# Patient Record
Sex: Female | Born: 1950
Health system: Southern US, Community
[De-identification: ages and names within clinical notes are randomized; demographics above are authoritative.]

## PROBLEM LIST (undated history)

## (undated) DIAGNOSIS — R011 Cardiac murmur, unspecified: Secondary | ICD-10-CM

## (undated) DIAGNOSIS — K635 Polyp of colon: Secondary | ICD-10-CM

## (undated) DIAGNOSIS — K5790 Diverticulosis of intestine, part unspecified, without perforation or abscess without bleeding: Secondary | ICD-10-CM

## (undated) DIAGNOSIS — E119 Type 2 diabetes mellitus without complications: Secondary | ICD-10-CM

## (undated) DIAGNOSIS — J45909 Unspecified asthma, uncomplicated: Secondary | ICD-10-CM

## (undated) DIAGNOSIS — H269 Unspecified cataract: Secondary | ICD-10-CM

## (undated) DIAGNOSIS — K219 Gastro-esophageal reflux disease without esophagitis: Secondary | ICD-10-CM

## (undated) DIAGNOSIS — T7840XA Allergy, unspecified, initial encounter: Secondary | ICD-10-CM

## (undated) DIAGNOSIS — I10 Essential (primary) hypertension: Secondary | ICD-10-CM

## (undated) DIAGNOSIS — J40 Bronchitis, not specified as acute or chronic: Secondary | ICD-10-CM

## (undated) HISTORY — PX: ESOPHAGOGASTRODUODENOSCOPY: SHX1529

## (undated) HISTORY — DX: Polyp of colon: K63.5

## (undated) HISTORY — DX: Gastro-esophageal reflux disease without esophagitis: K21.9

## (undated) HISTORY — PX: COLONOSCOPY: SHX174

## (undated) HISTORY — DX: Unspecified cataract: H26.9

## (undated) HISTORY — PX: TONSILLECTOMY: SUR1361

## (undated) HISTORY — DX: Allergy, unspecified, initial encounter: T78.40XA

## (undated) HISTORY — DX: Type 2 diabetes mellitus without complications: E11.9

## (undated) HISTORY — DX: Diverticulosis of intestine, part unspecified, without perforation or abscess without bleeding: K57.90

## (undated) HISTORY — DX: Unspecified asthma, uncomplicated: J45.909

## (undated) HISTORY — DX: Cardiac murmur, unspecified: R01.1

---

## 1999-02-09 ENCOUNTER — Encounter: Payer: Self-pay | Admitting: Emergency Medicine

## 1999-02-09 ENCOUNTER — Emergency Department (HOSPITAL_COMMUNITY): Admission: EM | Admit: 1999-02-09 | Discharge: 1999-02-09 | Payer: Self-pay

## 2002-08-12 ENCOUNTER — Encounter: Payer: Self-pay | Admitting: Family Medicine

## 2002-08-12 ENCOUNTER — Ambulatory Visit (HOSPITAL_COMMUNITY): Admission: RE | Admit: 2002-08-12 | Discharge: 2002-08-12 | Payer: Self-pay | Admitting: Family Medicine

## 2003-04-20 ENCOUNTER — Ambulatory Visit (HOSPITAL_COMMUNITY): Admission: RE | Admit: 2003-04-20 | Discharge: 2003-04-20 | Payer: Self-pay | Admitting: Family Medicine

## 2006-11-25 ENCOUNTER — Emergency Department (HOSPITAL_COMMUNITY): Admission: EM | Admit: 2006-11-25 | Discharge: 2006-11-25 | Payer: Self-pay | Admitting: Family Medicine

## 2008-09-27 ENCOUNTER — Emergency Department (HOSPITAL_COMMUNITY): Admission: EM | Admit: 2008-09-27 | Discharge: 2008-09-27 | Payer: Self-pay | Admitting: Family Medicine

## 2009-05-14 ENCOUNTER — Encounter (INDEPENDENT_AMBULATORY_CARE_PROVIDER_SITE_OTHER): Payer: Self-pay | Admitting: *Deleted

## 2009-07-28 ENCOUNTER — Emergency Department (HOSPITAL_COMMUNITY): Admission: EM | Admit: 2009-07-28 | Discharge: 2009-07-28 | Payer: Self-pay | Admitting: Family Medicine

## 2010-02-20 ENCOUNTER — Encounter: Payer: Self-pay | Admitting: Family Medicine

## 2010-03-02 NOTE — Letter (Signed)
Summary: New Patient letter  [facility]  [address] [place], [place] [address]   Phone: [phone]  Fax: [phone]       05/14/2009 MRN: [id number]  Erin Benitez [address] [place], [place]  [address]  Dear Erin Benitez,  Welcome to the Gastroenterology Division at [facility].    You are scheduled to see Dr.  Christella Hartigan  on 06-22-09 at 8:30am on the 3rd floor at [facility], [address]  We ask that you try to arrive at our office 15 minutes prior to your appointment time to allow for check-in.  We would like you to complete the enclosed self-administered evaluation form prior to your visit and bring it with you on the day of your appointment.  We will review it with you.  Also, please bring a complete list of all your medications or, if you prefer, bring the medication bottles and we will list them.  Please bring your insurance card so that we may make a copy of it.  If your insurance requires a referral to see a specialist, please bring your referral form from your primary care physician.  Co-payments are due at the time of your visit and may be paid by cash, check or credit card.     Your office visit will consist of a consult with your physician (includes a physical exam), any laboratory testing he/she may order, scheduling of any necessary diagnostic testing (e.g. x-ray, ultrasound, CT-scan), and scheduling of a procedure (e.g. Endoscopy, Colonoscopy) if required.  Please allow enough time on your schedule to allow for any/all of these possibilities.    If you cannot keep your appointment, please call [phone] to cancel or reschedule prior to your appointment date.  This allows [place] the opportunity to schedule an appointment for another patient in need of care.  If you do not cancel or reschedule by 5 p.m. the business day prior to your appointment date, you will be charged a $50.00 late cancellation/no-show fee.    Thank you for choosing [facility] for your medical needs.  We appreciate the opportunity to care for you.  Please visit [place] at our website  to learn more about our practice.                     Sincerely,                                                             The Gastroenterology Division

## 2010-04-19 ENCOUNTER — Other Ambulatory Visit: Payer: Self-pay | Admitting: Emergency Medicine

## 2010-04-26 ENCOUNTER — Other Ambulatory Visit: Payer: Self-pay

## 2010-05-04 ENCOUNTER — Ambulatory Visit (HOSPITAL_COMMUNITY)
Admission: RE | Admit: 2010-05-04 | Discharge: 2010-05-04 | Disposition: A | Discharge status: Home / Self Care | Payer: 59 | Source: Ambulatory Visit | Attending: Emergency Medicine | Admitting: Emergency Medicine

## 2010-05-04 DIAGNOSIS — K219 Gastro-esophageal reflux disease without esophagitis: Secondary | ICD-10-CM | POA: Insufficient documentation

## 2010-05-04 DIAGNOSIS — R079 Chest pain, unspecified: Secondary | ICD-10-CM | POA: Insufficient documentation

## 2010-05-08 LAB — URINALYSIS, MICROSCOPIC ONLY
Bilirubin Urine: NEGATIVE
Glucose, UA: NEGATIVE mg/dL
Ketones, ur: NEGATIVE mg/dL
Protein, ur: NEGATIVE mg/dL
pH: 6.5 (ref 5.0–8.0)

## 2010-05-08 LAB — COMPREHENSIVE METABOLIC PANEL
ALT: 14 U/L (ref 0–35)
AST: 19 U/L (ref 0–37)
Alkaline Phosphatase: 68 U/L (ref 39–117)
CO2: 27 mEq/L (ref 19–32)
Calcium: 9.1 mg/dL (ref 8.4–10.5)
GFR calc Af Amer: >60 mL/min (ref >60)
GFR calc non Af Amer: >60 mL/min (ref >60)
Glucose, Bld: 86 mg/dL (ref 70–99)
Potassium: 3.6 mEq/L (ref 3.5–5.1)
Sodium: 138 mEq/L (ref 135–145)
Total Protein: 7.6 g/dL (ref 6.0–8.3)

## 2010-05-08 LAB — DIFFERENTIAL
Basophils Relative: 1 % (ref 0–1)
Lymphocytes Relative: 39 % (ref 12–46)
Monocytes Absolute: 0.3 10*3/uL (ref 0.1–1.0)
Monocytes Relative: 11 % (ref 3–12)
Neutro Abs: 1.5 10*3/uL — ABNORMAL LOW (ref 1.7–7.7)
Neutrophils Relative %: 49 % (ref 43–77)

## 2010-05-08 LAB — POCT URINALYSIS DIP (DEVICE)
Glucose, UA: NEGATIVE mg/dL
Nitrite: NEGATIVE
Urobilinogen, UA: 1 mg/dL (ref 0.0–1.0)

## 2010-05-08 LAB — CBC
Hemoglobin: 12.8 g/dL (ref 12.0–15.0)
MCHC: 33.8 g/dL (ref 30.0–36.0)
RBC: 4.26 MIL/uL (ref 3.87–5.11)
WBC: 3.1 10*3/uL — ABNORMAL LOW (ref 4.0–10.5)

## 2011-06-02 ENCOUNTER — Ambulatory Visit: Payer: 59

## 2011-06-02 ENCOUNTER — Ambulatory Visit (INDEPENDENT_AMBULATORY_CARE_PROVIDER_SITE_OTHER): Payer: 59 | Admitting: Family Medicine

## 2011-06-02 VITALS — BP 182/94 | HR 79 | Temp 98.9°F | Resp 16 | Ht 63.0 in | Wt 143.0 lb

## 2011-06-02 DIAGNOSIS — K219 Gastro-esophageal reflux disease without esophagitis: Secondary | ICD-10-CM

## 2011-06-02 DIAGNOSIS — I959 Hypotension, unspecified: Secondary | ICD-10-CM

## 2011-06-02 DIAGNOSIS — I10 Essential (primary) hypertension: Secondary | ICD-10-CM

## 2011-06-02 DIAGNOSIS — M545 Low back pain: Secondary | ICD-10-CM

## 2011-06-02 MED ORDER — AMLODIPINE BESYLATE 5 MG PO TABS: 5.0000 mg | ORAL_TABLET | Freq: Every day | ORAL | Status: DC

## 2011-06-02 MED ORDER — CYCLOBENZAPRINE HCL 10 MG PO TABS: 10.0000 mg | ORAL_TABLET | Freq: Every evening | ORAL | Status: AC | PRN

## 2011-06-02 MED ORDER — ESOMEPRAZOLE MAGNESIUM 40 MG PO CPDR: 40.0000 mg | DELAYED_RELEASE_CAPSULE | Freq: Every day | ORAL | Status: DC

## 2011-06-02 MED ORDER — TRAMADOL HCL 50 MG PO TABS: 50.0000 mg | ORAL_TABLET | Freq: Three times a day (TID) | ORAL | Status: AC | PRN

## 2011-06-02 NOTE — Progress Notes (Signed)
  Subjective:    Patient ID: Erin Benitez, female    DOB: 1950-08-09, 61 y.o.   MRN: [id number]  Back Pain This is a recurrent problem. The current episode started 1 to 4 weeks ago. The problem has been gradually worsening since onset. The pain is present in the lumbar spine. The quality of the pain is described as aching. The pain does not radiate. The pain is at a severity of 7/10. The pain is moderate. The pain is worse during the day. The symptoms are aggravated by sitting. Stiffness is present in the morning.   Heat and walking helps pain.  Review of Systems  Musculoskeletal: Positive for back pain.       Objective:   Physical Exam  Constitutional: She appears well-developed.  Neck: Neck supple.  Cardiovascular: Normal rate, regular rhythm and normal heart sounds.   Pulmonary/Chest: Effort normal and breath sounds normal.  Musculoskeletal:       Lumbar back: She exhibits tenderness. She exhibits normal range of motion, no bony tenderness and no spasm.       Negative SLR  Neurological: She is alert. She has normal strength. No sensory deficit.  Reflex Scores:      Patellar reflexes are 2+ on the right side and 2+ on the left side.      Achilles reflexes are 1+ on the right side and 1+ on the left side.     [facility] reading (PRIMARY) by  Dr. Hal Hope Minimal degenerative changes L5-S1      Assessment & Plan:   1. LBP (low back pain)  DG Lumbar Spine 2-3 Views, cyclobenzaprine (FLEXERIL) 10 MG tablet, traMADol (ULTRAM) 50 MG tablet  2. HTN (hypertension)  amLODipine (NORVASC) 5 MG tablet  3. GERD (gastroesophageal reflux disease)  esomeprazole (NEXIUM) 40 MG capsule

## 2011-06-08 ENCOUNTER — Telehealth: Payer: Self-pay

## 2011-06-08 NOTE — Telephone Encounter (Signed)
LMOM to CB. Which med is making her sick - what are her Sxs? Also check status of back pain.

## 2011-06-08 NOTE — Telephone Encounter (Signed)
SAW DR Hal Hope FOR BACK PAIN.  SHE SAID THE MEDICINE SHE WAS GIVEN IS MAKING HER SICK.  CAN WE PLEASE CALL IN SOMETHING ELSE FOR HER?

## 2011-06-08 NOTE — Telephone Encounter (Signed)
Pt called back and reported that it is the tramadol that made her sick. She tried it w/food and it still caused her to be very sick w/ N/V and dizziness. She said that it helped pain a little but she still had pain and she was just so sick she couldn't take it. I asked her if she has ever had to take pain med in past that did not make her sick. She stated the only thing she thinks she has ever taken was Oxycodone after tooth surgery for a couple of days and it didn't make her sick and worked for that pain. She doesn't think she has ever had to take anything else.

## 2011-06-09 ENCOUNTER — Telehealth: Payer: Self-pay | Admitting: Family Medicine

## 2011-06-09 NOTE — Telephone Encounter (Signed)
LM on patient's VM asking for follow up on patient's back symptoms. We can call in Tylenol with codeine or Vicodin for pain however Oxycontin would not be appropriate at this time. If symptoms are worsening please ask patient to RTC.

## 2011-06-10 NOTE — Telephone Encounter (Signed)
Pt Erin Benitez and reported that her pain is not worsening, it is just about the same. She would be thankful if we would call in whichever of the suggested pain meds that might help her the most and/or be least likely to make her sick. Please call pt after med is sent in.

## 2011-06-11 NOTE — Telephone Encounter (Signed)
LMOM to CB, I need pharmacy of choice to call in RX to pt

## 2011-06-11 NOTE — Telephone Encounter (Signed)
Pt called back and wanted me to call in RX to [facility]. Called in RX and left on the VM due to them being closed

## 2011-06-11 NOTE — Telephone Encounter (Signed)
Please call in Tylenol #3 1 po every 8 hours as needed #30. Thanks

## 2011-07-09 NOTE — Telephone Encounter (Signed)
OK Tylenol #3 every 8 hours as needed #30.  If pain persists RTC.  See nursing documentation

## 2012-12-26 ENCOUNTER — Encounter (HOSPITAL_COMMUNITY): Payer: Self-pay | Admitting: Emergency Medicine

## 2012-12-26 ENCOUNTER — Emergency Department (INDEPENDENT_AMBULATORY_CARE_PROVIDER_SITE_OTHER): Payer: Self-pay

## 2012-12-26 ENCOUNTER — Emergency Department (INDEPENDENT_AMBULATORY_CARE_PROVIDER_SITE_OTHER)
Admission: EM | Admit: 2012-12-26 | Discharge: 2012-12-26 | Disposition: A | Discharge status: Home / Self Care | Payer: Self-pay | Source: Home / Self Care | Attending: Emergency Medicine | Admitting: Emergency Medicine

## 2012-12-26 DIAGNOSIS — R05 Cough: Secondary | ICD-10-CM

## 2012-12-26 DIAGNOSIS — I10 Essential (primary) hypertension: Secondary | ICD-10-CM

## 2012-12-26 HISTORY — DX: Essential (primary) hypertension: I10

## 2012-12-26 HISTORY — DX: Bronchitis, not specified as acute or chronic: J40

## 2012-12-26 MED ORDER — AMLODIPINE BESYLATE 5 MG PO TABS: 5.0000 mg | ORAL_TABLET | Freq: Every day | ORAL | Status: DC

## 2012-12-26 MED ORDER — FLUTICASONE PROPIONATE 50 MCG/ACT NA SUSP: 2.0000 | Freq: Every day | NASAL | Status: DC

## 2012-12-26 MED ORDER — HYDROCOD POLST-CHLORPHEN POLST 10-8 MG/5ML PO LQCR: 5.0000 mL | Freq: Two times a day (BID) | ORAL | Status: DC | PRN

## 2012-12-26 MED ORDER — ALBUTEROL SULFATE HFA 108 (90 BASE) MCG/ACT IN AERS: 1.0000 | INHALATION_SPRAY | Freq: Four times a day (QID) | RESPIRATORY_TRACT | Status: DC | PRN

## 2012-12-26 MED ORDER — AMOXICILLIN 500 MG PO CAPS: 1000.0000 mg | ORAL_CAPSULE | Freq: Three times a day (TID) | ORAL | Status: DC

## 2012-12-26 MED ORDER — ESOMEPRAZOLE MAGNESIUM 40 MG PO CPDR: 40.0000 mg | DELAYED_RELEASE_CAPSULE | Freq: Every day | ORAL | Status: DC

## 2012-12-26 NOTE — ED Notes (Signed)
Concern for ' lung cancer '; Recent visit to sister who has recently been diagnosed as having brain cancer. Pt has cough for 3 weeks, and she had read in Internet that a "persistant cough" can be a sympt om of lung cancer. C/o she has tried multiple OTC meds w/o relief

## 2012-12-26 NOTE — ED Provider Notes (Signed)
Chief Complaint:   Chief Complaint  Patient presents with  . Cough    History of Present Illness:   Erin Benitez is a 62 year old female who has had a three-week history of a cough productive of small amounts of clear sputum. She's felt short of breath and had some tightness in her chest when she coughs. She also notes sternal pain when she coughs. She's had sweats and felt chilled but no fever. She notes clear rhinorrhea, postnasal drainage, and her eyes itch and water. Her throat has felt scratchy. She has a history of reflux and is on Nexium. This has flared up about a week ago and she's only been taking the medication for about a week. She denies any wheezing or asthma history. She's never been a cigarette smoker. She denies any history of allergies. She is concerned about the possibility of lung cancer since several other family members have had cancer.  Review of Systems:  Other than noted above, the patient denies any of the following symptoms: Systemic:  No fevers, chills, sweats, weight loss or gain, or fatigue. ENT:  No nasal congestion, sneezing, itching, postnasal drip, sinus pressure, headache, sore throat, or hoarseness. Lungs:  No wheezing, shortness of breath, chest tightness or congestion. Heart:  No chest pain, tightness, pressure, PND, orthopnea, or ankle edema. GI:  No indigestion, heartburn, waterbrash, burping, abdominal pain, nausea, or vomiting.  PMFSH:  Past medical history, family history, social history, meds, and allergies were reviewed.  Specifically, there is no history of asthma, allergies, reflux esophagitis or cigarette smoking. She has a history of a heart murmur but never been treated for this. She had had high blood pressure and was on amlodipine, but she stopped this on her own.  Physical Exam:   Vital signs:  There were no vitals taken for this visit. General:  Alert and oriented.  In no distress.  Skin warm and dry. ENT: TMs and ear canals normal.  Nasal  mucosa normal, without drainage.  Pharynx clear without exudate or drainage.  No intraoral lesions. Neck:  No adenopathy, tenderness or mass.  No JVD. Lungs:  No respiratory distress.  Breath sounds clear and equal bilaterally.  No wheezes, rales or rhonchi. Heart:  Regular rhythm, no gallops or murmers.  No pedal edema. Abdomon:  Soft and nontender.  No organomegaly or mass.  Radiology:  Dg Chest 2 View  12/26/2012   CLINICAL DATA:  Nonproductive cough for 3 weeks  EXAM: CHEST  2 VIEW  COMPARISON:  07/28/2009  FINDINGS: Cardiac shadow is within normal limits. The aortic knob shows mild calcifications. The lungs are clear bilaterally. No acute bony abnormality is seen.  IMPRESSION: No acute abnormality noted.   Electronically Signed   By: Alcide Clever M.D.   On: 12/26/2012 09:16   Assessment:  The primary encounter diagnosis was Cough. A diagnosis of Hypertension was also pertinent to this visit.  Persisting cough, probably multifactorial. This could be a persistent viral cough, reactive airways disease, due to reflux, or due to postnasal drainage from sinusitis.  Plan:   1.  Meds:  The following meds were prescribed:   New Prescriptions   ALBUTEROL (PROVENTIL HFA;VENTOLIN HFA) 108 (90 BASE) MCG/ACT INHALER    Inhale 1-2 puffs into the lungs every 6 (six) hours as needed for wheezing or shortness of breath.   AMLODIPINE (NORVASC) 5 MG TABLET    Take 1 tablet (5 mg total) by mouth daily.   AMOXICILLIN (AMOXIL) 500 MG CAPSULE  Take 2 capsules (1,000 mg total) by mouth 3 (three) times daily.   CHLORPHENIRAMINE-HYDROCODONE (TUSSIONEX) 10-8 MG/5ML LQCR    Take 5 mLs by mouth every 12 (twelve) hours as needed for cough.   ESOMEPRAZOLE (NEXIUM) 40 MG CAPSULE    Take 1 capsule (40 mg total) by mouth daily.   FLUTICASONE (FLONASE) 50 MCG/ACT NASAL SPRAY    Place 2 sprays into both nostrils daily.    2.  Patient Education/Counseling:  The patient was given appropriate handouts, self care  instructions, and instructed in symptomatic relief.  Suggested she restart her amlodipine since her blood pressure was elevated today in followup with her primary care physician in 2 weeks. She should stay on her Nexium and follow a reflux diet.  3.  Follow up:  The patient was told to follow up if no better in 10 days, or sooner if becoming worse in any way, and given some red flag symptoms such as fever or difficulty breathing which would prompt immediate return.  Follow up here or with her primary care physician, and in 2 weeks with Dr. Nadyne Coombes for her blood pressure.       Reuben Likes, MD 12/26/12 [phone]

## 2013-01-11 ENCOUNTER — Ambulatory Visit: Payer: Self-pay | Attending: Internal Medicine

## 2013-02-11 ENCOUNTER — Ambulatory Visit: Payer: Self-pay

## 2013-04-01 ENCOUNTER — Ambulatory Visit (INDEPENDENT_AMBULATORY_CARE_PROVIDER_SITE_OTHER): Payer: BC Managed Care – PPO | Admitting: Internal Medicine

## 2013-04-01 VITALS — BP 182/100 | HR 77 | Temp 98.0°F | Resp 18 | Ht 63.0 in | Wt 148.2 lb

## 2013-04-01 DIAGNOSIS — J45909 Unspecified asthma, uncomplicated: Secondary | ICD-10-CM | POA: Insufficient documentation

## 2013-04-01 DIAGNOSIS — Z889 Allergy status to unspecified drugs, medicaments and biological substances status: Secondary | ICD-10-CM

## 2013-04-01 DIAGNOSIS — Z0000 Encounter for general adult medical examination without abnormal findings: Secondary | ICD-10-CM

## 2013-04-01 DIAGNOSIS — I10 Essential (primary) hypertension: Secondary | ICD-10-CM | POA: Insufficient documentation

## 2013-04-01 DIAGNOSIS — K219 Gastro-esophageal reflux disease without esophagitis: Secondary | ICD-10-CM

## 2013-04-01 DIAGNOSIS — Z111 Encounter for screening for respiratory tuberculosis: Secondary | ICD-10-CM

## 2013-04-01 DIAGNOSIS — E1159 Type 2 diabetes mellitus with other circulatory complications: Secondary | ICD-10-CM | POA: Insufficient documentation

## 2013-04-01 DIAGNOSIS — Z9109 Other allergy status, other than to drugs and biological substances: Secondary | ICD-10-CM

## 2013-04-01 LAB — POCT CBC
GRANULOCYTE PERCENT: 53.9 % (ref 37–80)
HEMATOCRIT: 38.9 % (ref 37.7–47.9)
HEMOGLOBIN: 11.7 g/dL — AB (ref 12.2–16.2)
Lymph, poc: 1.3 (ref 0.6–3.4)
MCH, POC: 27.7 pg (ref 27–31.2)
MCHC: 30.1 g/dL — AB (ref 31.8–35.4)
MCV: 92.2 fL (ref 80–97)
MID (cbc): 0.4 (ref 0–0.9)
MPV: 8.9 fL (ref 0–99.8)
POC GRANULOCYTE: 2 (ref 2–6.9)
POC LYMPH PERCENT: 35.4 %L (ref 10–50)
POC MID %: 10.7 %M (ref 0–12)
Platelet Count, POC: 238 10*3/uL (ref 142–424)
RBC: 4.22 M/uL (ref 4.04–5.48)
RDW, POC: 14.5 %
WBC: 3.7 10*3/uL — AB (ref 4.6–10.2)

## 2013-04-01 MED ORDER — ALBUTEROL SULFATE HFA 108 (90 BASE) MCG/ACT IN AERS: 1.0000 | INHALATION_SPRAY | Freq: Four times a day (QID) | RESPIRATORY_TRACT | Status: DC | PRN

## 2013-04-01 MED ORDER — AMLODIPINE BESYLATE 10 MG PO TABS: 10.0000 mg | ORAL_TABLET | Freq: Every day | ORAL | Status: DC

## 2013-04-01 MED ORDER — FLUTICASONE PROPIONATE 50 MCG/ACT NA SUSP: 2.0000 | Freq: Every day | NASAL | Status: DC

## 2013-04-01 MED ORDER — ESOMEPRAZOLE MAGNESIUM 40 MG PO CPDR: 40.0000 mg | DELAYED_RELEASE_CAPSULE | Freq: Every day | ORAL | Status: DC

## 2013-04-01 NOTE — Progress Notes (Signed)
   Subjective:    Patient ID: Erin Benitez, female    DOB: [birth year], 63 y.o.   MRN: [id number]  HPI    Review of Systems     Objective:   Physical Exam        Assessment & Plan:

## 2013-04-01 NOTE — Patient Instructions (Addendum)
Colonoscopy A colonoscopy is an exam to look at the entire large intestine (colon). This exam can help find problems such as tumors, polyps, inflammation, and areas of bleeding. The exam takes about 1 hour.  LET [facility] CARE PROVIDER KNOW ABOUT:   Any allergies you have.  All medicines you are taking, including vitamins, herbs, eye drops, creams, and over-the-counter medicines.  Previous problems you or members of your family have had with the use of anesthetics.  Any blood disorders you have.  Previous surgeries you have had.  Medical conditions you have. RISKS AND COMPLICATIONS  Generally, this is a safe procedure. However, as with any procedure, complications can occur. Possible complications include:  Bleeding.  Tearing or rupture of the colon wall.  Reaction to medicines given during the exam.  Infection (rare). BEFORE THE PROCEDURE   Ask your health care provider about changing or stopping your regular medicines.  You may be prescribed an oral bowel prep. This involves drinking a large amount of medicated liquid, starting the day before your procedure. The liquid will cause you to have multiple loose stools until your stool is almost clear or light green. This cleans out your colon in preparation for the procedure.  Do not eat or drink anything else once you have started the bowel prep, unless your health care provider tells you it is safe to do so.  Arrange for someone to drive you home after the procedure. PROCEDURE   You will be given medicine to help you relax (sedative).  You will lie on your side with your knees bent.  A long, flexible tube with a light and camera on the end (colonoscope) will be inserted through the rectum and into the colon. The camera sends video back to a computer screen as it moves through the colon. The colonoscope also releases carbon dioxide gas to inflate the colon. This helps your health care provider see the area better.  During  the exam, your health care provider may take a small tissue sample (biopsy) to be examined under a microscope if any abnormalities are found.  The exam is finished when the entire colon has been viewed. AFTER THE PROCEDURE   Do not drive for 24 hours after the exam.  You may have a small amount of blood in your stool.  You may pass moderate amounts of gas and have mild abdominal cramping or bloating. This is caused by the gas used to inflate your colon during the exam.  Ask when your test results will be ready and how you will get your results. Make sure you get your test results. Document Released: 01/15/2000 Document Revised: 11/07/2012 Document Reviewed: 09/24/2012 [facility] Patient Information 2014 [place]. DASH Diet The DASH diet stands for "Dietary Approaches to Stop Hypertension." It is a healthy eating plan that has been shown to reduce high blood pressure (hypertension) in as little as 14 days, while also possibly providing other significant health benefits. These other health benefits include reducing the risk of breast cancer after menopause and reducing the risk of type 2 diabetes, heart disease, colon cancer, and stroke. Health benefits also include weight loss and slowing kidney failure in patients with chronic kidney disease.  DIET GUIDELINES  Limit salt (sodium). Your diet should contain less than 1500 mg of sodium daily.  Limit refined or processed carbohydrates. Your diet should include mostly whole grains. Desserts and added sugars should be used sparingly.  Include small amounts of heart-healthy fats. These types of fats include  nuts, oils, and tub margarine. Limit saturated and trans fats. These fats have been shown to be harmful in the body. CHOOSING FOODS  The following food groups are based on a 2000 calorie diet. See your Registered Dietitian for individual calorie needs. Grains and Grain Products (6 to 8 servings daily)  Eat More Often: Whole-wheat bread,  brown rice, whole-grain or wheat pasta, quinoa, popcorn without added fat or salt (air popped).  Eat Less Often: White bread, white pasta, white rice, cornbread. Vegetables (4 to 5 servings daily)  Eat More Often: Fresh, frozen, and canned vegetables. Vegetables may be raw, steamed, roasted, or grilled with a minimal amount of fat.  Eat Less Often/Avoid: Creamed or fried vegetables. Vegetables in a cheese sauce. Fruit (4 to 5 servings daily)  Eat More Often: All fresh, canned (in natural juice), or frozen fruits. Dried fruits without added sugar. One hundred percent fruit juice ( cup [237 mL] daily).  Eat Less Often: Dried fruits with added sugar. Canned fruit in light or heavy syrup. [employer], Fish, and Poultry (2 servings or less daily. One serving is 3 to 4 oz [85-114 g]).  Eat More Often: Ninety percent or leaner ground beef, tenderloin, sirloin. Round cuts of beef, chicken breast, [place] breast. All fish. Grill, bake, or broil your meat. Nothing should be fried.  Eat Less Often/Avoid: Fatty cuts of meat, [place], or chicken leg, thigh, or wing. Fried cuts of meat or fish. Dairy (2 to 3 servings)  Eat More Often: Low-fat or fat-free milk, low-fat plain or light yogurt, reduced-fat or part-skim cheese.  Eat Less Often/Avoid: Milk (whole, 2%).Whole milk yogurt. Full-fat cheeses. Nuts, Seeds, and Legumes (4 to 5 servings per week)  Eat More Often: All without added salt.  Eat Less Often/Avoid: Salted nuts and seeds, canned beans with added salt. Fats and Sweets (limited)  Eat More Often: Vegetable oils, tub margarines without trans fats, sugar-free gelatin. Mayonnaise and salad dressings.  Eat Less Often/Avoid: Coconut oils, palm oils, butter, stick margarine, cream, half and half, cookies, candy, pie. FOR MORE INFORMATION The Dash Diet Eating Plan: www.dashdiet.org Document Released: 01/06/2011 Document Revised: 04/11/2011 Document Reviewed: 01/06/2011 [facility] Patient  Information 2014 [place], [place]. Hypertension As your heart beats, it forces blood through your arteries. This force is your blood pressure. If the pressure is too high, it is called hypertension (HTN) or high blood pressure. HTN is dangerous because you may have it and not know it. High blood pressure may mean that your heart has to work harder to pump blood. Your arteries may be narrow or stiff. The extra work puts you at risk for heart disease, stroke, and other problems.  Blood pressure consists of two numbers, a higher number over a lower, 110/72, for example. It is stated as "110 over 72." The ideal is below 120 for the top number (systolic) and under 80 for the bottom (diastolic). Write down your blood pressure today. You should pay close attention to your blood pressure if you have certain conditions such as:  Heart failure.  Prior heart attack.  Diabetes  Chronic kidney disease.  Prior stroke.  Multiple risk factors for heart disease. To see if you have HTN, your blood pressure should be measured while you are seated with your arm held at the level of the heart. It should be measured at least twice. A one-time elevated blood pressure reading (especially in the Emergency Department) does not mean that you need treatment. There may be conditions in which the  blood pressure is different between your right and left arms. It is important to see your caregiver soon for a recheck. Most people have essential hypertension which means that there is not a specific cause. This type of high blood pressure may be lowered by changing lifestyle factors such as:  Stress.  Smoking.  Lack of exercise.  Excessive weight.  Drug/tobacco/alcohol use.  Eating less salt. Most people do not have symptoms from high blood pressure until it has caused damage to the body. Effective treatment can often prevent, delay or reduce that damage. TREATMENT  When a cause has been identified, treatment for high  blood pressure is directed at the cause. There are a large number of medications to treat HTN. These fall into several categories, and your caregiver will help you select the medicines that are best for you. Medications may have side effects. You should review side effects with your caregiver. If your blood pressure stays high after you have made lifestyle changes or started on medicines,   Your medication(s) may need to be changed.  Other problems may need to be addressed.  Be certain you understand your prescriptions, and know how and when to take your medicine.  Be sure to follow up with your caregiver within the time frame advised (usually within two weeks) to have your blood pressure rechecked and to review your medications.  If you are taking more than one medicine to lower your blood pressure, make sure you know how and at what times they should be taken. Taking two medicines at the same time can result in blood pressure that is too low. SEEK IMMEDIATE MEDICAL CARE IF:  You develop a severe headache, blurred or changing vision, or confusion.  You have unusual weakness or numbness, or a faint feeling.  You have severe chest or abdominal pain, vomiting, or breathing problems. MAKE SURE YOU:   Understand these instructions.  Will watch your condition.  Will get help right away if you are not doing well or get worse. Document Released: 01/17/2005 Document Revised: 04/11/2011 Document Reviewed: 09/07/2007 [facility] Patient Information 2014 [place]. Diet for Gastroesophageal Reflux Disease, Adult Reflux (acid reflux) is when acid from your stomach flows up into the esophagus. When acid comes in contact with the esophagus, the acid causes irritation and soreness (inflammation) in the esophagus. When reflux happens often or so severely that it causes damage to the esophagus, it is called gastroesophageal reflux disease (GERD). Nutrition therapy can help ease the discomfort of  GERD. FOODS OR DRINKS TO AVOID OR LIMIT  Smoking or chewing tobacco. Nicotine is one of the most potent stimulants to acid production in the gastrointestinal tract.  Caffeinated and decaffeinated coffee and black tea.  Regular or low-calorie carbonated beverages or energy drinks (caffeine-free carbonated beverages are allowed).   Strong spices, such as black pepper, white pepper, red pepper, cayenne, curry powder, and chili powder.  Peppermint or spearmint.  Chocolate.  High-fat foods, including meats and fried foods. Extra added fats including oils, butter, salad dressings, and nuts. Limit these to less than 8 tsp per day.  Fruits and vegetables if they are not tolerated, such as citrus fruits or tomatoes.  Alcohol.  Any food that seems to aggravate your condition. If you have questions regarding your diet, call your caregiver or a registered dietitian. OTHER THINGS THAT MAY HELP GERD INCLUDE:   Eating your meals slowly, in a relaxed setting.  Eating 5 to 6 small meals per day instead of 3 large  meals.  Eliminating food for a period of time if it causes distress.  Not lying down until 3 hours after eating a meal.  Keeping the head of your bed raised 6 to 9 inches (15 to 23 cm) by using a foam wedge or blocks under the legs of the bed. Lying flat may make symptoms worse.  Being physically active. Weight loss may be helpful in reducing reflux in overweight or obese adults.  Wear loose fitting clothing EXAMPLE MEAL PLAN This meal plan is approximately 2,000 calories based on [email or web address] meal planning guidelines. Breakfast   cup cooked oatmeal.  1 cup strawberries.  1 cup low-fat milk.  1 oz almonds. Snack  1 cup cucumber slices.  6 oz yogurt (made from low-fat or fat-free milk). Lunch  2 slice whole-wheat bread.  2 oz sliced [place].  2 tsp mayonnaise.  1 cup blueberries.  1 cup snap peas. Snack  6 whole-wheat crackers.  1 oz string  cheese. Dinner   cup brown rice.  1 cup mixed veggies.  1 tsp olive oil.  3 oz grilled fish. Document Released: 01/17/2005 Document Revised: 04/11/2011 Document Reviewed: 12/03/2010 [facility] Patient Information 2014 [place], [place].

## 2013-04-01 NOTE — Progress Notes (Signed)
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   Also needs a RF of Nexium, Albuterol, Norvasc and Flonase Has been out of the Norvasc and Nexium x 2 weeks     Tuberculosis Risk Questionnaire  1. No Were you born outside the [place] in one of the following parts of the world: [place], [place], [place], [place] or [place]?   2. No Have you traveled outside the [place] and lived for more than one month in one of the following parts of the world: [place], [place], [place], [place] or [place]?    3. No Do you have a compromised immune system such as nany of the following conditions:HIV/AIDS, organ or bone marrow transplantation, diabetes, immunosuppressive medicines (e.g. Prednisone, Remicaide), leukemia, lymphoma, cancer of the head or neck, gastrectomy or jejunal bypass, end-stage renal disease (on dialysis), or silicosis?     4.No Have you ever or do you plan on working in: a residential care center, a health care facility, a jail or prison or homeless shelter?    5.No  Have you ever: injected illegal drugs, used crack cocaine, lived in a homeless shelter  noor been in jail or prison?    6.No Have you ever been exposed to anyone with infectious tuberculosis?    Tuberculosis Symptom Questionnaire  Do you currently have any of the following symptoms?  1.  No Unexplained cough lasting more than 3 weeks?   2. No Unexplained fever lasting more than 3 weeks.   3. No Night Sweats (sweating that leaves the bedclothes and sheets wet)    4. Yes due to GERD Shortness of Breath   5. No Chest Pain   6. No Unintentional weight loss   7. No Unexplained fatigue (very tired for no reason)

## 2013-04-01 NOTE — Progress Notes (Signed)
   Subjective:    Patient ID: Erin Benitez, female    DOB: 1950/06/22, 63 y.o.   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   Also needs a RF of Nexium, Albuterol, Norvasc and Flonase Has been out of the Norvasc and Nexium x 2 weeks  Has not had a mammogram in year and has never had a colonoscopy.   Tuberculosis Risk Questionnaire  1. No Were you born outside the [place] in one of the following parts of the world: [place], [place], [place], [place] or [place]?   2. No Have you traveled outside the [place] and lived for more than one month in one of the following parts of the world: [place], [place], [place], [place] or [place]?    3. No Do you have a compromised immune system such as nany of the following conditions:HIV/AIDS, organ or bone marrow transplantation, diabetes, immunosuppressive medicines (e.g. Prednisone, Remicaide), leukemia, lymphoma, cancer of the head or neck, gastrectomy or jejunal bypass, end-stage renal disease (on dialysis), or silicosis?     4.No Have you ever or do you plan on working in: a residential care center, a health care facility, a jail or prison or homeless shelter?    5.No  Have you ever: injected illegal drugs, used crack cocaine, lived in a homeless shelter  noor been in jail or prison?    6.No Have you ever been exposed to anyone with infectious tuberculosis?    Tuberculosis Symptom Questionnaire  Do you currently have any of the following symptoms?  1.  No Unexplained cough lasting more than 3 weeks?   2. No Unexplained fever lasting more than 3 weeks.   3. No Night Sweats (sweating that leaves the bedclothes and sheets wet)    4. Yes due to GERD Shortness of Breath   Review of Systems  Respiratory: Positive for cough, shortness of breath and wheezing.        Has a h/o of GERD and asthma  Musculoskeletal: Positive for myalgias.  Allergic/Immunologic: Positive for  environmental allergies.       Seasonal  Neurological: Positive for headaches.       Objective:   Physical Exam        Assessment & Plan:

## 2013-04-02 LAB — COMPLETE METABOLIC PANEL WITH GFR
ALBUMIN: 4.6 g/dL (ref 3.5–5.2)
ALT: 23 U/L (ref 0–35)
AST: 19 U/L (ref 0–37)
Alkaline Phosphatase: 91 U/L (ref 39–117)
BUN: 14 mg/dL (ref 6–23)
CO2: 27 meq/L (ref 19–32)
Calcium: 9.8 mg/dL (ref 8.4–10.5)
Chloride: 102 mEq/L (ref 96–112)
Creat: 0.68 mg/dL (ref 0.50–1.10)
GLUCOSE: 98 mg/dL (ref 70–99)
POTASSIUM: 4.3 meq/L (ref 3.5–5.3)
SODIUM: 139 meq/L (ref 135–145)
TOTAL PROTEIN: 7.9 g/dL (ref 6.0–8.3)
Total Bilirubin: 0.5 mg/dL (ref 0.2–1.2)

## 2013-04-02 LAB — LIPID PANEL
Cholesterol: 285 mg/dL — ABNORMAL HIGH (ref 0–200)
HDL: 44 mg/dL (ref >39)
LDL CALC: 198 mg/dL — AB (ref 0–99)
Total CHOL/HDL Ratio: 6.5 Ratio
Triglycerides: 216 mg/dL — ABNORMAL HIGH (ref <150)
VLDL: 43 mg/dL — AB (ref 0–40)

## 2013-04-02 LAB — TSH: TSH: 1.024 u[IU]/mL (ref 0.350–4.500)

## 2013-04-03 ENCOUNTER — Encounter: Payer: Self-pay | Admitting: *Deleted

## 2013-04-04 ENCOUNTER — Ambulatory Visit (INDEPENDENT_AMBULATORY_CARE_PROVIDER_SITE_OTHER): Payer: BC Managed Care – PPO | Admitting: Family Medicine

## 2013-04-04 DIAGNOSIS — Z111 Encounter for screening for respiratory tuberculosis: Secondary | ICD-10-CM

## 2013-04-04 LAB — TB SKIN TEST
Induration: 0 mm
TB SKIN TEST: NEGATIVE

## 2013-04-16 ENCOUNTER — Other Ambulatory Visit: Payer: Self-pay | Admitting: Internal Medicine

## 2013-05-13 ENCOUNTER — Encounter (INDEPENDENT_AMBULATORY_CARE_PROVIDER_SITE_OTHER): Payer: BC Managed Care – PPO | Admitting: Ophthalmology

## 2013-05-13 DIAGNOSIS — H35039 Hypertensive retinopathy, unspecified eye: Secondary | ICD-10-CM

## 2013-05-13 DIAGNOSIS — I10 Essential (primary) hypertension: Secondary | ICD-10-CM

## 2013-05-13 DIAGNOSIS — H2510 Age-related nuclear cataract, unspecified eye: Secondary | ICD-10-CM

## 2013-05-13 DIAGNOSIS — H3540 Unspecified peripheral retinal degeneration: Secondary | ICD-10-CM

## 2013-06-10 ENCOUNTER — Encounter: Payer: Self-pay | Admitting: Internal Medicine

## 2014-03-04 ENCOUNTER — Emergency Department (HOSPITAL_COMMUNITY): Payer: 59

## 2014-03-04 ENCOUNTER — Emergency Department (HOSPITAL_COMMUNITY)
Admission: EM | Admit: 2014-03-04 | Discharge: 2014-03-04 | Disposition: A | Discharge status: Home / Self Care | Payer: 59 | Attending: Emergency Medicine | Admitting: Emergency Medicine

## 2014-03-04 ENCOUNTER — Encounter (HOSPITAL_COMMUNITY): Payer: Self-pay

## 2014-03-04 DIAGNOSIS — J45909 Unspecified asthma, uncomplicated: Secondary | ICD-10-CM | POA: Diagnosis not present

## 2014-03-04 DIAGNOSIS — R1031 Right lower quadrant pain: Secondary | ICD-10-CM | POA: Diagnosis not present

## 2014-03-04 DIAGNOSIS — R1013 Epigastric pain: Secondary | ICD-10-CM | POA: Diagnosis present

## 2014-03-04 DIAGNOSIS — R630 Anorexia: Secondary | ICD-10-CM | POA: Diagnosis not present

## 2014-03-04 DIAGNOSIS — I10 Essential (primary) hypertension: Secondary | ICD-10-CM | POA: Diagnosis not present

## 2014-03-04 DIAGNOSIS — R1032 Left lower quadrant pain: Secondary | ICD-10-CM | POA: Diagnosis not present

## 2014-03-04 DIAGNOSIS — Z79899 Other long term (current) drug therapy: Secondary | ICD-10-CM | POA: Diagnosis not present

## 2014-03-04 DIAGNOSIS — Z7951 Long term (current) use of inhaled steroids: Secondary | ICD-10-CM | POA: Diagnosis not present

## 2014-03-04 DIAGNOSIS — R109 Unspecified abdominal pain: Secondary | ICD-10-CM

## 2014-03-04 LAB — URINALYSIS, ROUTINE W REFLEX MICROSCOPIC
BILIRUBIN URINE: NEGATIVE
GLUCOSE, UA: NEGATIVE mg/dL
HGB URINE DIPSTICK: NEGATIVE
Ketones, ur: NEGATIVE mg/dL
Leukocytes, UA: NEGATIVE
NITRITE: NEGATIVE
PH: 7 (ref 5.0–8.0)
PROTEIN: NEGATIVE mg/dL
SPECIFIC GRAVITY, URINE: 1.015 (ref 1.005–1.030)
Urobilinogen, UA: 0.2 mg/dL (ref 0.0–1.0)

## 2014-03-04 LAB — COMPREHENSIVE METABOLIC PANEL
ALK PHOS: 96 U/L (ref 39–117)
ALT: 20 U/L (ref 0–35)
ANION GAP: 8 (ref 5–15)
AST: 24 U/L (ref 0–37)
Albumin: 4.4 g/dL (ref 3.5–5.2)
BILIRUBIN TOTAL: 0.7 mg/dL (ref 0.3–1.2)
BUN: 16 mg/dL (ref 6–23)
CHLORIDE: 105 mmol/L (ref 96–112)
CO2: 26 mmol/L (ref 19–32)
Calcium: 9.1 mg/dL (ref 8.4–10.5)
Creatinine, Ser: 0.65 mg/dL (ref 0.50–1.10)
GLUCOSE: 143 mg/dL — AB (ref 70–99)
Potassium: 3.8 mmol/L (ref 3.5–5.1)
Sodium: 139 mmol/L (ref 135–145)
Total Protein: 7.9 g/dL (ref 6.0–8.3)

## 2014-03-04 LAB — CBC WITH DIFFERENTIAL/PLATELET
BASOS ABS: 0 10*3/uL (ref 0.0–0.1)
BASOS PCT: 0 % (ref 0–1)
Eosinophils Absolute: 0.1 10*3/uL (ref 0.0–0.7)
Eosinophils Relative: 2 % (ref 0–5)
HCT: 39.3 % (ref 36.0–46.0)
Hemoglobin: 13 g/dL (ref 12.0–15.0)
LYMPHS ABS: 1.6 10*3/uL (ref 0.7–4.0)
LYMPHS PCT: 43 % (ref 12–46)
MCH: 28.8 pg (ref 26.0–34.0)
MCHC: 33.1 g/dL (ref 30.0–36.0)
MCV: 86.9 fL (ref 78.0–100.0)
MONO ABS: 0.4 10*3/uL (ref 0.1–1.0)
MONOS PCT: 10 % (ref 3–12)
NEUTROS PCT: 45 % (ref 43–77)
Neutro Abs: 1.7 10*3/uL (ref 1.7–7.7)
Platelets: 242 10*3/uL (ref 150–400)
RBC: 4.52 MIL/uL (ref 3.87–5.11)
RDW: 12.7 % (ref 11.5–15.5)
WBC: 3.8 10*3/uL — AB (ref 4.0–10.5)

## 2014-03-04 MED ORDER — DICYCLOMINE HCL 20 MG PO TABS: 20.0000 mg | ORAL_TABLET | Freq: Two times a day (BID) | ORAL | Status: DC

## 2014-03-04 MED ORDER — ACETAMINOPHEN 325 MG PO TABS
650.0000 mg | ORAL_TABLET | Freq: Once | ORAL | Status: AC
  Administered 2014-03-04: 650 mg via ORAL
  Filled 2014-03-04: qty 2

## 2014-03-04 MED ORDER — MORPHINE SULFATE 4 MG/ML IJ SOLN
4.0000 mg | Freq: Once | INTRAMUSCULAR | Status: DC
  Filled 2014-03-04: qty 1

## 2014-03-04 MED ORDER — IOHEXOL 300 MG/ML  SOLN
50.0000 mL | Freq: Once | INTRAMUSCULAR | Status: AC | PRN
  Administered 2014-03-04: 50 mL via ORAL

## 2014-03-04 MED ORDER — IOHEXOL 300 MG/ML  SOLN
100.0000 mL | Freq: Once | INTRAMUSCULAR | Status: AC | PRN
  Administered 2014-03-04: 100 mL via INTRAVENOUS

## 2014-03-04 MED ORDER — HYDROCODONE-ACETAMINOPHEN 5-325 MG PO TABS: 1.0000 | ORAL_TABLET | Freq: Four times a day (QID) | ORAL | Status: DC | PRN

## 2014-03-04 MED ORDER — ONDANSETRON HCL 4 MG/2ML IJ SOLN
4.0000 mg | Freq: Once | INTRAMUSCULAR | Status: DC
  Filled 2014-03-04: qty 2

## 2014-03-04 NOTE — ED Notes (Addendum)
CT at beside.

## 2014-03-04 NOTE — Discharge Instructions (Signed)
Abdominal Pain, Women °Abdominal (stomach, pelvic, or belly) pain can be caused by many things. It is important to tell your doctor: °· The location of the pain. °· Does it come and go or is it present all the time? °· Are there things that start the pain (eating certain foods, exercise)? °· Are there other symptoms associated with the pain (fever, nausea, vomiting, diarrhea)? °All of this is helpful to know when trying to find the cause of the pain. °CAUSES  °· Stomach: virus or bacteria infection, or ulcer. °· Intestine: appendicitis (inflamed appendix), regional ileitis (Crohn's disease), ulcerative colitis (inflamed colon), irritable bowel syndrome, diverticulitis (inflamed diverticulum of the colon), or cancer of the stomach or intestine. °· Gallbladder disease or stones in the gallbladder. °· Kidney disease, kidney stones, or infection. °· Pancreas infection or cancer. °· Fibromyalgia (pain disorder). °· Diseases of the female organs: °¨ Uterus: fibroid (non-cancerous) tumors or infection. °¨ Fallopian tubes: infection or tubal pregnancy. °¨ Ovary: cysts or tumors. °¨ Pelvic adhesions (scar tissue). °¨ Endometriosis (uterus lining tissue growing in the pelvis and on the pelvic organs). °¨ Pelvic congestion syndrome (female organs filling up with blood just before the menstrual period). °¨ Pain with the menstrual period. °¨ Pain with ovulation (producing an egg). °¨ Pain with an IUD (intrauterine device, birth control) in the uterus. °¨ Cancer of the female organs. °· Functional pain (pain not caused by a disease, may improve without treatment). °· Psychological pain. °· Depression. °DIAGNOSIS  °Your doctor will decide the seriousness of your pain by doing an examination. °· Blood tests. °· X-rays. °· Ultrasound. °· CT scan (computed tomography, special type of X-ray). °· MRI (magnetic resonance imaging). °· Cultures, for infection. °· Barium enema (dye inserted in the large intestine, to better view it with  X-rays). °· Colonoscopy (looking in intestine with a lighted tube). °· Laparoscopy (minor surgery, looking in abdomen with a lighted tube). °· Major abdominal exploratory surgery (looking in abdomen with a large incision). °TREATMENT  °The treatment will depend on the cause of the pain.  °· Many cases can be observed and treated at home. °· Over-the-counter medicines recommended by your caregiver. °· Prescription medicine. °· Antibiotics, for infection. °· Birth control pills, for painful periods or for ovulation pain. °· Hormone treatment, for endometriosis. °· Nerve blocking injections. °· Physical therapy. °· Antidepressants. °· Counseling with a psychologist or psychiatrist. °· Minor or major surgery. °HOME CARE INSTRUCTIONS  °· Do not take laxatives, unless directed by your caregiver. °· Take over-the-counter pain medicine only if ordered by your caregiver. Do not take aspirin because it can cause an upset stomach or bleeding. °· Try a clear liquid diet (broth or water) as ordered by your caregiver. Slowly move to a bland diet, as tolerated, if the pain is related to the stomach or intestine. °· Have a thermometer and take your temperature several times a day, and record it. °· Bed rest and sleep, if it helps the pain. °· Avoid sexual intercourse, if it causes pain. °· Avoid stressful situations. °· Keep your follow-up appointments and tests, as your caregiver orders. °· If the pain does not go away with medicine or surgery, you may try: °¨ Acupuncture. °¨ Relaxation exercises (yoga, meditation). °¨ Group therapy. °¨ Counseling. °SEEK MEDICAL CARE IF:  °· You notice certain foods cause stomach pain. °· Your home care treatment is not helping your pain. °· You need stronger pain medicine. °· You want your IUD removed. °· You feel faint or   lightheaded. °· You develop nausea and vomiting. °· You develop a rash. °· You are having side effects or an allergy to your medicine. °SEEK IMMEDIATE MEDICAL CARE IF:  °· Your  pain does not go away or gets worse. °· You have a fever. °· Your pain is felt only in portions of the abdomen. The right side could possibly be appendicitis. The left lower portion of the abdomen could be colitis or diverticulitis. °· You are passing blood in your stools (bright red or black tarry stools, with or without vomiting). °· You have blood in your urine. °· You develop chills, with or without a fever. °· You pass out. °MAKE SURE YOU:  °· Understand these instructions. °· Will watch your condition. °· Will get help right away if you are not doing well or get worse. °Document Released: 11/14/2006 Document Revised: 06/03/2013 Document Reviewed: 12/04/2008 °Exit[employer]® Patient Information ©2015 [employer]. This information is not intended to replace advice given to you by your health care provider. Make sure you discuss any questions you have with your health care provider. ° °

## 2014-03-04 NOTE — ED Notes (Signed)
Pt states abdominal pain, back pain for about a month.  Pt leaving out of town to visit sister with cancer.  Wanted things checked before going out of town.

## 2014-03-04 NOTE — ED Notes (Signed)
Patient transported to CT 

## 2014-03-04 NOTE — ED Provider Notes (Signed)
CSN: [id number]     Arrival date & time 03/04/14  0738 History   First MD Initiated Contact with Patient 03/04/14 [id number]     Chief Complaint  Patient presents with  . Abdominal Pain     (Consider location/radiation/quality/duration/timing/severity/associated sxs/prior Treatment) Patient is a 64 y.o. female presenting with abdominal pain. The history is provided by the patient.  Abdominal Pain Pain location:  Epigastric, LLQ and RLQ Pain quality: sharp and shooting   Pain radiates to:  Does not radiate Pain severity:  Moderate Onset quality:  Gradual Duration: 1 month. Timing:  Constant Progression:  Worsening Chronicity:  New Context: not previous surgeries, not recent illness, not recent travel and not sick contacts   Context comment:  States since the pain started she has tried to change what she is eating however has not improved sx.  She thinks it is worse with eating Relieved by:  Nothing Worsened by:  Eating (lying certain ways) Ineffective treatments:  Antacids Associated symptoms: anorexia   Associated symptoms: no chest pain, no chills, no constipation, no cough, no diarrhea, no dysuria, no fever, no nausea, no shortness of breath and no vomiting   Risk factors: no alcohol abuse, has not had multiple surgeries, no NSAID use and no recent hospitalization     Past Medical History  Diagnosis Date  . Hypertension   . Bronchitis   . Asthma    Past Surgical History  Procedure Laterality Date  . Cesarean section     Family History  Problem Relation Age of Onset  . Cancer Mother   . Heart disease Mother   . Cancer Father   . Cancer Sister   . Cancer Sister    History  Substance Use Topics  . Smoking status: Never Smoker   . Smokeless tobacco: Never Used  . Alcohol Use: No   OB History    No data available     Review of Systems  Constitutional: Positive for unexpected weight change. Negative for fever and chills.       Several pound weight loss  Respiratory:  Negative for cough and shortness of breath.   Cardiovascular: Negative for chest pain.  Gastrointestinal: Positive for abdominal pain and anorexia. Negative for nausea, vomiting, diarrhea and constipation.  Genitourinary: Negative for dysuria.  All other systems reviewed and are negative.     Allergies  Review of patient's allergies indicates no known allergies.  Home Medications   Prior to Admission medications   Medication Sig Start Date End Date Taking? Authorizing Provider  albuterol (PROVENTIL HFA;VENTOLIN HFA) 108 (90 BASE) MCG/ACT inhaler Inhale 1-2 puffs into the lungs every 6 (six) hours as needed for wheezing or shortness of breath. 04/01/13   Orma Flaming, MD  amLODipine (NORVASC) 10 MG tablet Take 1 tablet (10 mg total) by mouth daily. 04/01/13   Orma Flaming, MD  esomeprazole (NEXIUM) 40 MG capsule Take 1 capsule (40 mg total) by mouth daily. 04/01/13   Orma Flaming, MD  fluticasone [facility]) 50 MCG/ACT nasal spray Place 2 sprays into both nostrils daily. 04/01/13   Orma Flaming, MD   BP 179/99 mmHg  Pulse 76  Temp(Src) 98.1 F (36.7 C) (Oral)  Resp 18  SpO2 100% Physical Exam  Constitutional: She is oriented to person, place, and time. She appears well-developed and well-nourished. No distress.  HENT:  Head: Normocephalic and atraumatic.  Mouth/Throat: Oropharynx is clear and moist.  Eyes: Conjunctivae and EOM are normal. Pupils are equal, round, and  reactive to light.  Neck: Normal range of motion. Neck supple.  Cardiovascular: Normal rate, regular rhythm and intact distal pulses.   No murmur heard. Pulmonary/Chest: Effort normal and breath sounds normal. No respiratory distress. She has no wheezes. She has no rales.  Abdominal: Soft. Normal appearance. She exhibits no distension. There is tenderness in the right lower quadrant, suprapubic area and left lower quadrant. There is no rebound, no guarding and no CVA tenderness.  Musculoskeletal: Normal range of motion.  She exhibits no edema or tenderness.  Neurological: She is alert and oriented to person, place, and time.  Skin: Skin is warm and dry. No rash noted. No erythema.  Psychiatric: She has a normal mood and affect. Her behavior is normal.  Nursing note and vitals reviewed.   ED Course  Procedures (including critical care time) Labs Review Labs Reviewed  CBC WITH DIFFERENTIAL/PLATELET - Abnormal; Notable for the following:    WBC 3.8 (*)    All other components within normal limits  COMPREHENSIVE METABOLIC PANEL - Abnormal; Notable for the following:    Glucose, Bld 143 (*)    All other components within normal limits  URINALYSIS, ROUTINE W REFLEX MICROSCOPIC    Imaging Review Ct Abdomen Pelvis W Contrast  03/04/2014   CLINICAL DATA:  64 year old female with 1 month history of left-sided abdominal pain and distention.  EXAM: CT ABDOMEN AND PELVIS WITH CONTRAST  TECHNIQUE: Multidetector CT imaging of the abdomen and pelvis was performed using the standard protocol following bolus administration of intravenous contrast.  CONTRAST:  141mL OMNIPAQUE IOHEXOL 300 MG/ML  SOLN  COMPARISON:  Prior abdominal ultrasound 05/04/2010  FINDINGS: Lower Chest: The lung bases are clear. Visualized cardiac structures are within normal limits for size. No pericardial effusion. Unremarkable visualized distal thoracic esophagus.  Abdomen: Unremarkable CT appearance of the stomach, duodenum, spleen, pancreas, and adrenal glands. Normal hepatic contour and morphology. No discrete hepatic lesion. Geographic hypoattenuation in the left hemi-liver adjacent to the fissure for the falciform ligament is nonspecific but most suggestive of benign focal fatty infiltration. Gallbladder is unremarkable. No intra or extrahepatic biliary ductal dilatation.  Unremarkable appearance of the bilateral kidneys. No focal solid lesion, hydronephrosis or nephrolithiasis. Small 1 cm simple cyst exophytic from the anteromedial aspect of the right  lower pole. A 1 cm simple cyst in the interpolar left kidney is also noted.  No evidence of obstruction or focal bowel wall thickening. Normal appendix in the right lower quadrant. The terminal ileum is unremarkable. No evidence of free fluid or suspicious adenopathy.  Pelvis: Unremarkable adnexum, and bladder. Mild heterogeneity of the uterus. Suspect fundal fibroid. No free fluid or suspicious adenopathy.  Bones/Soft Tissues: No acute fracture or aggressive appearing lytic or blastic osseous lesion.  Vascular: Atherosclerotic vascular disease without significant stenosis or aneurysmal dilatation.  IMPRESSION: 1. No abnormality in the abdomen or pelvis to explain the patient's clinical symptoms. 2. Incidental note is made of small simple renal cysts bilaterally. 3. Mild scattered atherosclerotic vascular calcifications without evidence of aneurysmal dilatation or significant stenosis.   Electronically Signed   By: Jacqulynn Cadet M.D.   On: 03/04/2014 10:06     EKG Interpretation None      MDM   Final diagnoses:  Abdominal pain    Patient with gradually worsening abdominal pain over the last month. She's been trying to take her Nexium without improvement. The pain seems to be slightly worse with eating but is intermittent and worsening throughout the day. No change in  bowel movements. Denies any urinary symptoms. On exam patient has significant lower abdominal pain in the right lower and left lower quadrant concerning for possible diverticulitis or colitis. Given the symptoms of been going on for a month of doubt appendicitis. She is no right upper quadrant or left upper quadrant pain concerning for appendicitis or pancreatitis. Epigastric pain. She denies any heart or lung symptoms and is well-appearing otherwise. She has never had a colonoscopy.  CBC, CMP, UA, CT of the abdomen and pelvis pending  10:41 AM Labs and imaging is negative. Discussed with patient diet changes and follow-up with GI  for colonoscopy.   Blanchie Dessert, MD 03/04/14 1041

## 2014-03-04 NOTE — ED Notes (Signed)
Pt aware that a urine sample is needed.  

## 2014-03-05 ENCOUNTER — Encounter: Payer: Self-pay | Admitting: Internal Medicine

## 2014-03-25 ENCOUNTER — Ambulatory Visit: Payer: 59 | Admitting: Internal Medicine

## 2014-05-08 ENCOUNTER — Ambulatory Visit (INDEPENDENT_AMBULATORY_CARE_PROVIDER_SITE_OTHER): Payer: 59 | Admitting: Internal Medicine

## 2014-05-08 ENCOUNTER — Telehealth: Payer: Self-pay

## 2014-05-08 ENCOUNTER — Encounter: Payer: Self-pay | Admitting: Internal Medicine

## 2014-05-08 VITALS — BP 170/100 | HR 64 | Ht 63.0 in | Wt 144.2 lb

## 2014-05-08 DIAGNOSIS — Z1211 Encounter for screening for malignant neoplasm of colon: Secondary | ICD-10-CM

## 2014-05-08 DIAGNOSIS — R1010 Upper abdominal pain, unspecified: Secondary | ICD-10-CM | POA: Diagnosis not present

## 2014-05-08 DIAGNOSIS — R1012 Left upper quadrant pain: Secondary | ICD-10-CM

## 2014-05-08 DIAGNOSIS — R1011 Right upper quadrant pain: Secondary | ICD-10-CM

## 2014-05-08 DIAGNOSIS — R194 Change in bowel habit: Secondary | ICD-10-CM | POA: Diagnosis not present

## 2014-05-08 DIAGNOSIS — R12 Heartburn: Secondary | ICD-10-CM | POA: Diagnosis not present

## 2014-05-08 NOTE — Telephone Encounter (Signed)
I placed a referral but it appears that Dr. Carlean Purl also placed an order for this. Please let patient know that if she gets a call to for an appointment twice that she should just cancel one of them.  Thank you! Jaynee Eagles, PA-C [facility] [phone] 05/08/2014  3:55 PM

## 2014-05-08 NOTE — Telephone Encounter (Signed)
Can we refer pt for colonoscopy? She just saw Dr Carlean Purl today.

## 2014-05-08 NOTE — Telephone Encounter (Signed)
Pt saw Dr. Elder Cyphers a little over a year ago and he referred her for a colonoscopy.  She never went and after seeing a ER doctor recently he also told hershe also needs to get a colonoscopy.  She now has [employer] and is having to change her PCP to [place] so that we can make this referral again.  Can we do this without her coming in?  Please call [phone]

## 2014-05-08 NOTE — Patient Instructions (Signed)
  You have been scheduled for a colonoscopy. Please follow written instructions given to you at your visit today.  Please pick up your prep supplies at the pharmacy within the next 1-3 days. If you use inhalers (even only as needed), please bring them with you on the day of your procedure. Your physician has requested that you go to www.[email or web address].com and enter the access code given to you at your visit today. This web site gives a general overview about your procedure. However, you should still follow specific instructions given to you by our office regarding your preparation for the procedure.    I appreciate the opportunity to care for you.  

## 2014-05-08 NOTE — Progress Notes (Signed)
   Subjective:    Patient ID: Erin Benitez, female    DOB: 11-26-1950, 64 y.o.   MRN: [id number] Cc: abdominal pain, change in bowel habits HPI 64 yo bw here with daughter - was seen in ED in feb 2016 with bilateral upper abdominal pain, some shest pain and heartburn. Started esomeprazole 20 mg daily and that has relieved heartburn and chest pain. No dysphagia.  Still bothered by bilateral upper abdominal pain - "moves around". Better after defecation. Dicyclomine helps. Is having more frequent defecation lately. Has never had similar problems. Is somewhat distraught as she thought she was having a colonoscopy today and had her daughter take off work. "They should have explained that when I made this appointment". She has never had a colonoscopy. This was recommended in ED  In ED CBC, CMET, CT abd/pelvis with contrast ok. Notes and reports reviewed. Medications, allergies, past medical history, past surgical history, family history and social history are reviewed and updated in the EMR.  Review of Systems Recent cough, headaches, dyspnea. All other ROS negative or as per HPI    Objective:   Physical Exam @BP  170/100 mmHg  Pulse 64  Ht 5\' 3"  (1.6 m)  Wt 144 lb 3.2 oz (65.409 kg)  BMI 25.55 kg/m2@  General:  Well-developed, well-nourished and in no acute distress Eyes:  anicteric. ENT:   Mouth and posterior pharynx free of lesions.  Neck:   supple w/o thyromegaly or mass.  Lungs: Clear to auscultation bilaterally. Heart:  S1S2, no rubs, murmurs, gallops. Abdomen:  soft, nontender, no hepatosplenomegaly, hernia, or mass and BS+.  Rectal: deferred Lymph:  no cervical or supraclavicular adenopathy. Extremities:   no edema, cyanosis or clubbing Skin   no rash. Neuro:  A&O x 3.  Psych:  appropriate mood and  Affect.   Data Reviewed:  As per HPI CT results 03/04/2014 1. No abnormality in the abdomen or pelvis to explain the patient's clinical symptoms. 2. Incidental note is made of  small simple renal cysts bilaterally. 3. Mild scattered atherosclerotic vascular calcifications without evidence of aneurysmal dilatation or significant stenosis.  Also negative abdominal ultrasound 2012    Assessment & Plan:   1. Bowel habit changes   2. Bilateral upper abdominal pain   3. Heartburn    Information today is reassuring. She has responded to PPIs far as heartburn and chest pain issues or concerns.  I'll dicyclomine has helped somewhat she continues to have abdominal pain and more frequent defecation. Irritable bowel syndrome seems most likely but a diagnostic colonoscopy is appropriate. We will schedule that for tomorrow. I apologize for the miscommunication that she would not have a colonoscopy today.  The risks and benefits as well as alternatives of endoscopic procedure(s) have been discussed and reviewed. All questions answered. The patient agrees to proceed.   Further plans pending that investigation.  I appreciate the opportunity to care for this patient. CC: FULP, CAMMIE, MD

## 2014-05-09 ENCOUNTER — Ambulatory Visit (AMBULATORY_SURGERY_CENTER): Payer: 59 | Admitting: Internal Medicine

## 2014-05-09 ENCOUNTER — Encounter: Payer: Self-pay | Admitting: Internal Medicine

## 2014-05-09 VITALS — BP 132/78 | HR 73 | Temp 97.2°F | Resp 15 | Ht 63.0 in | Wt 144.0 lb

## 2014-05-09 DIAGNOSIS — K635 Polyp of colon: Secondary | ICD-10-CM

## 2014-05-09 DIAGNOSIS — R194 Change in bowel habit: Secondary | ICD-10-CM

## 2014-05-09 DIAGNOSIS — K5730 Diverticulosis of large intestine without perforation or abscess without bleeding: Secondary | ICD-10-CM

## 2014-05-09 DIAGNOSIS — D125 Benign neoplasm of sigmoid colon: Secondary | ICD-10-CM | POA: Diagnosis not present

## 2014-05-09 MED ORDER — SODIUM CHLORIDE 0.9 % IV SOLN: 500.0000 mL | INTRAVENOUS | Status: DC

## 2014-05-09 MED ORDER — DICYCLOMINE HCL 20 MG PO TABS: 20.0000 mg | ORAL_TABLET | Freq: Four times a day (QID) | ORAL | Status: DC | PRN

## 2014-05-09 NOTE — Patient Instructions (Addendum)
I found and removed 4 tiny polyps. Two of the 4 were retrieved for pathology evaluation. They all look benign. You also have a condition called diverticulosis - common and not usually a problem. Please read the handout provided.  I suspect you are having some Irritable Bowel Syndrome problems. I have refilled the dicyclomine. If that fails to help you feel better then call me back/come see me again.   I will let you know pathology results and when to have another routine colonoscopy by mail.  I appreciate the opportunity to care for you. Gatha Mayer, MD, FACG  YOU HAD AN ENDOSCOPIC PROCEDURE TODAY AT [facility]:   Refer to the procedure report that was given to you for any specific questions about what was found during the examination.  If the procedure report does not answer your questions, please call your gastroenterologist to clarify.  If you requested that your care partner not be given the details of your procedure findings, then the procedure report has been included in a sealed envelope for you to review at your convenience later.  YOU SHOULD EXPECT: Some feelings of bloating in the abdomen. Passage of more gas than usual.  Walking can help get rid of the air that was put into your GI tract during the procedure and reduce the bloating. If you had a lower endoscopy (such as a colonoscopy or flexible sigmoidoscopy) you may notice spotting of blood in your stool or on the toilet paper. If you underwent a bowel prep for your procedure, you may not have a normal bowel movement for a few days.  Please Note:  You might notice some irritation and congestion in your nose or some drainage.  This is from the oxygen used during your procedure.  There is no need for concern and it should clear up in a day or so.  SYMPTOMS TO REPORT IMMEDIATELY:   Following lower endoscopy (colonoscopy or flexible sigmoidoscopy):  Excessive amounts of blood in the stool  Significant  tenderness or worsening of abdominal pains  Swelling of the abdomen that is new, acute  Fever of 100F or higher     For urgent or emergent issues, a gastroenterologist can be reached at any hour by calling [phone].   DIET: Your first meal following the procedure should be a small meal and then it is ok to progress to your normal diet. Heavy or fried foods are harder to digest and may make you feel nauseous or bloated.  Likewise, meals heavy in dairy and vegetables can increase bloating.  Drink plenty of fluids but you should avoid alcoholic beverages for 24 hours.  ACTIVITY:  You should plan to take it easy for the rest of today and you should NOT DRIVE or use heavy machinery until tomorrow (because of the sedation medicines used during the test).    FOLLOW UP: Our staff will call the number listed on your records the next business day following your procedure to check on you and address any questions or concerns that you may have regarding the information given to you following your procedure. If we do not reach you, we will leave a message.  However, if you are feeling well and you are not experiencing any problems, there is no need to return our call.  We will assume that you have returned to your regular daily activities without incident.  If any biopsies were taken you will be contacted by phone or by letter within the  next 1-3 weeks.  Please call us at [phone] if you have not heard about the biopsies in 3 weeks.    SIGNATURES/CONFIDENTIALITY: You and/or your care partner have signed paperwork which will be entered into your electronic medical record.  These signatures attest to the fact that that the information above on your After Visit Summary has been reviewed and is understood.  Full responsibility of the confidentiality of this discharge information lies with you and/or your care-partner.  Polyp and diverticulosis information given.

## 2014-05-09 NOTE — Op Note (Signed)
[place]  [address] [place], [id number]   COLONOSCOPY PROCEDURE REPORT  PATIENT: Erin Benitez, Erin Benitez  MR#: [id number] BIRTHDATE: April 28, 1950 , 63  yrs. old GENDER: female ENDOSCOPIST: Gatha Mayer, MD, [facility] PROCEDURE DATE:  05/09/2014 PROCEDURE:   Colonoscopy, diagnostic and Colonoscopy with snare polypectomy First Screening Colonoscopy - Avg.  risk and is 50 yrs.  old or older - No.  Prior Negative Screening - Now for repeat screening. N/A  History of Adenoma - Now for follow-up colonoscopy & has been > or = to 3 yrs.  N/A ASA CLASS:   Class III INDICATIONS:Colorectal Neoplasm Risk Assessment for this procedure is average risk and change in bowel habits. MEDICATIONS: Propofol 250 mg IV and Monitored anesthesia care  DESCRIPTION OF PROCEDURE:   After the risks benefits and alternatives of the procedure were thoroughly explained, informed consent was obtained.  The digital rectal exam revealed no abnormalities of the rectum.   The LB [id number]  endoscope was introduced through the anus and advanced to the terminal ileum which was intubated for a short distance. No adverse events experienced.   The quality of the prep was good.  (MiraLax was used)  The instrument was then slowly withdrawn as the colon was fully examined.   COLON FINDINGS: Four sessile polyps ranging from 2 to 81mm in size were found in the sigmoid colon.  Polypectomies were performed with a cold snare.  The resection was complete, the polyp tissue was partially retrieved and sent to histology.   There was mild diverticulosis noted throughout the entire examined colon.   The examination was otherwise normal.  Retroflexed views revealed no abnormalities. The time to cecum = 1.7 Withdrawal time = 9.0   The scope was withdrawn and the procedure completed. COMPLICATIONS: There were no immediate complications.  ENDOSCOPIC IMPRESSION: 1.   Four sessile polyps ranging from 2 to 23mm in size were  found in the sigmoid colon; polypectomies were performed with a cold snare 2.   Mild diverticulosis was noted throughout the entire examined colon 3.   The examination was otherwise normal  RECOMMENDATIONS: 1.  Timing of repeat colonoscopy will be determined by pathology findings. 2.  Dicyclomine prn 3.  F/U GI office prn  eSigned:  Gatha Mayer, MD, [facility] 05/09/2014 3:20 PM   cc: The Patient

## 2014-05-09 NOTE — Progress Notes (Signed)
To recovery, report to Scott, RN, VSS 

## 2014-05-09 NOTE — Progress Notes (Signed)
Called to room to assist during endoscopic procedure.  Patient ID and intended procedure confirmed with present staff. Received instructions for my participation in the procedure from the performing physician.  

## 2014-05-12 ENCOUNTER — Telehealth: Payer: Self-pay | Admitting: *Deleted

## 2014-05-12 NOTE — Telephone Encounter (Signed)
  Follow up Call-  Call back number 05/09/2014  Post procedure Call Back phone  # [phone] cell  Permission to leave phone message Yes     Patient questions:  Do you have a fever, pain , or abdominal swelling? No. Pain Score  0 *  Have you tolerated food without any problems? Yes.    Have you been able to return to your normal activities? Yes.    Do you have any questions about your discharge instructions: Diet   No. Medications  No. Follow up visit  No.  Do you have questions or concerns about your Care? No.  Actions: * If pain score is 4 or above: No action needed, pain <4.

## 2014-05-15 ENCOUNTER — Encounter: Payer: Self-pay | Admitting: Internal Medicine

## 2014-05-15 NOTE — Progress Notes (Signed)
Quick Note:  Diminutive hyperplastic polyps (distal colon) -2 of 4, 2 not recovered repeat colonoscopy 2021 ______

## 2014-12-12 ENCOUNTER — Ambulatory Visit: Payer: Self-pay

## 2014-12-23 ENCOUNTER — Other Ambulatory Visit: Payer: Self-pay | Admitting: Nurse Practitioner

## 2014-12-23 DIAGNOSIS — R928 Other abnormal and inconclusive findings on diagnostic imaging of breast: Secondary | ICD-10-CM

## 2015-03-07 ENCOUNTER — Ambulatory Visit (INDEPENDENT_AMBULATORY_CARE_PROVIDER_SITE_OTHER): Payer: No Typology Code available for payment source

## 2015-03-07 ENCOUNTER — Other Ambulatory Visit: Payer: Self-pay | Admitting: Family Medicine

## 2015-03-07 ENCOUNTER — Ambulatory Visit (INDEPENDENT_AMBULATORY_CARE_PROVIDER_SITE_OTHER): Payer: BLUE CROSS/BLUE SHIELD | Admitting: Family Medicine

## 2015-03-07 ENCOUNTER — Telehealth: Payer: Self-pay

## 2015-03-07 ENCOUNTER — Ambulatory Visit (HOSPITAL_COMMUNITY)
Admission: RE | Admit: 2015-03-07 | Discharge: 2015-03-07 | Disposition: A | Discharge status: Home / Self Care | Payer: BLUE CROSS/BLUE SHIELD | Source: Ambulatory Visit | Attending: Family Medicine | Admitting: Family Medicine

## 2015-03-07 VITALS — BP 136/88 | HR 68 | Temp 98.2°F | Resp 16 | Ht 63.0 in | Wt 149.6 lb

## 2015-03-07 DIAGNOSIS — K5730 Diverticulosis of large intestine without perforation or abscess without bleeding: Secondary | ICD-10-CM | POA: Insufficient documentation

## 2015-03-07 DIAGNOSIS — R1013 Epigastric pain: Secondary | ICD-10-CM

## 2015-03-07 LAB — POCT CBC
Granulocyte percent: 57 %G (ref 37–80)
HCT, POC: 38.1 % (ref 37.7–47.9)
Hemoglobin: 13.3 g/dL (ref 12.2–16.2)
Lymph, poc: 1.6 (ref 0.6–3.4)
MCH, POC: 29.5 pg (ref 27–31.2)
MCHC: 34.9 g/dL (ref 31.8–35.4)
MCV: 84.7 fL (ref 80–97)
MID (cbc): 0.3 (ref 0–0.9)
MPV: 6.9 fL (ref 0–99.8)
POC Granulocyte: 2.5 (ref 2–6.9)
POC LYMPH PERCENT: 36.2 %L (ref 10–50)
POC MID %: 6.8 %M (ref 0–12)
Platelet Count, POC: 262 10*3/uL (ref 142–424)
RBC: 4.5 M/uL (ref 4.04–5.48)
RDW, POC: 13.2 %
WBC: 4.4 10*3/uL — AB (ref 4.6–10.2)

## 2015-03-07 LAB — COMPLETE METABOLIC PANEL WITH GFR
ALT: 15 U/L (ref 6–29)
AST: 15 U/L (ref 10–35)
Albumin: 4.3 g/dL (ref 3.6–5.1)
Alkaline Phosphatase: 87 U/L (ref 33–130)
BUN: 14 mg/dL (ref 7–25)
CO2: 27 mmol/L (ref 20–31)
Calcium: 9 mg/dL (ref 8.6–10.4)
Chloride: 102 mmol/L (ref 98–110)
Creat: 0.57 mg/dL (ref 0.50–0.99)
GFR, Est African American: >89 mL/min (ref >=60)
GFR, Est Non African American: >89 mL/min (ref >=60)
Glucose, Bld: 123 mg/dL — ABNORMAL HIGH (ref 65–99)
Potassium: 4.1 mmol/L (ref 3.5–5.3)
Sodium: 138 mmol/L (ref 135–146)
Total Bilirubin: 0.6 mg/dL (ref 0.2–1.2)
Total Protein: 7.1 g/dL (ref 6.1–8.1)

## 2015-03-07 LAB — POCT SEDIMENTATION RATE: POCT SED RATE: 31 mm/hr — AB (ref 0–22)

## 2015-03-07 MED ORDER — AMOXICILLIN-POT CLAVULANATE 875-125 MG PO TABS: 1.0000 | ORAL_TABLET | Freq: Two times a day (BID) | ORAL | Status: DC

## 2015-03-07 MED ORDER — IOHEXOL 300 MG/ML  SOLN
50.0000 mL | Freq: Once | INTRAMUSCULAR | Status: AC | PRN
  Administered 2015-03-07: 50 mL via ORAL

## 2015-03-07 MED ORDER — IOHEXOL 300 MG/ML  SOLN
100.0000 mL | Freq: Once | INTRAMUSCULAR | Status: AC | PRN
  Administered 2015-03-07: 100 mL via INTRAVENOUS

## 2015-03-07 NOTE — Patient Instructions (Addendum)
Please go to [facility] emergency dept and register as an out patient for a CT scan with and without contrast.      Because you received an x-ray today, you will receive an invoice from [facility]. Please contact [facility] at [phone] with questions or concerns regarding your invoice. Our billing staff will not be able to assist you with those questions.   We need to get a CAT scan of the abdomen to better understand this persistent problem.

## 2015-03-07 NOTE — Telephone Encounter (Signed)
Patient was seen today by Dr. Joseph Art. She just left [facility] getting her CT scan. She said Dr. Carlean Jews was going to call her in an antibiotic and she also would like something for pain. Please call back at [phone].

## 2015-03-07 NOTE — Progress Notes (Addendum)
By signing my name below, I, Rawaa Al Rifaie, attest that this documentation has been prepared under the direction and in the presence of Robyn Haber, [place], Medical Scribe. 03/07/2015.  10:13 AM.   Patient ID: Erin Benitez MRN: [id number], DOB: 23-Dec-1950, 65 y.o. Date of Encounter: 03/07/2015  Primary Physician: Kennon Portela, MD  Chief Complaint:  Chief Complaint  Patient presents with  . Abdominal Pain    3 weeks  . Chest Pain    HPI:  Erin Benitez is a 65 y.o. female who presents to [facility] complaining of waxing and waning abdominal pain in the upper mid abdomen, gradual onset 3 weeks ago. Pt reports that being in a supine position or movements such as turning sides exacerbates that pain. She indicates that eating food seems to alleviate the pain. She notes that she seems to have a decreased appetite, and states that she has lost about 2 lbs in the past 2 weeks. Pt takes nexuim and gasx, and pain medications from dents extraction that she found relief with. Pt is UTD with colonoscopy less than a year ago. She notes a family history of cancer, however she reports no history of DM. She denies vomiting, diarrhea, change in stool, urinary symptoms, or blood in the stool.   Pt works as a Pharmacist, hospital part time.    Past Medical History  Diagnosis Date  . Hypertension   . Bronchitis   . Asthma   . Allergy   . GERD (gastroesophageal reflux disease)   . Heart murmur      Home Meds: Prior to Admission medications   Medication Sig Start Date End Date Taking? Authorizing Provider  amLODipine (NORVASC) 10 MG tablet Take 1 tablet (10 mg total) by mouth daily. 04/01/13  Yes Orma Flaming, MD  dicyclomine (BENTYL) 20 MG tablet Take 1 tablet (20 mg total) by mouth every 6 (six) hours as needed for spasms (abdominal pain). 05/09/14  Yes Gatha Mayer, MD  esomeprazole (NEXIUM) 20 MG capsule Take 40 mg by mouth daily at 12 noon. Reported on 03/07/2015     Historical Provider, MD    Allergies:  Allergies  Allergen Reactions  . Peanut-Containing Drug Products Anaphylaxis, Hives, Swelling and Other (See Comments)    Pistachio's - swelling of the throat     . Tramadol Nausea And Vomiting    Headache & dizziness with n&v    Social History   Social History  . Marital Status: Married    Spouse Name: N/A  . Number of Children: 1  . Years of Education: N/A   Occupational History  . Educator    Social History Main Topics  . Smoking status: Never Smoker   . Smokeless tobacco: Never Used  . Alcohol Use: No  . Drug Use: No  . Sexual Activity: Not Currently   Other Topics Concern  . Not on file   Social History Narrative   She reports she is single. She has 1 daughter. She is an Tourist information centre manager. 2 caffeinated beverages daily.     Review of Systems: Constitutional: negative for chills, fever, night sweats, weight changes, or fatigue. Positive for change in appetite, change in weight.  HEENT: negative for vision changes, hearing loss, congestion, rhinorrhea, ST, epistaxis, or sinus pressure Cardiovascular: negative for chest pain or palpitations Respiratory: negative for hemoptysis, wheezing, shortness of breath, or cough Abdominal: negative for blood in stool, nausea, vomiting, diarrhea, or constipation. Positive for abdominal pain.  Dermatological: negative for rash Neurologic: negative for headache, dizziness, or syncope GU: negative for frequency, dysuria.  All other systems reviewed and are otherwise negative with the exception to those above and in the HPI.  Physical Exam: Blood pressure 136/88, pulse 68, temperature 98.2 F (36.8 C), temperature source Oral, resp. rate 16, height 5\' 3"  (1.6 m), weight 149 lb 9.6 oz (67.858 kg), SpO2 97 %., Body mass index is 26.51 kg/(m^2). General: Well developed, well nourished, in no acute distress. Head: Normocephalic, atraumatic, eyes without discharge, sclera non-icteric, nares are without  discharge. Bilateral auditory canals clear, TM's are without perforation, pearly grey and translucent with reflective cone of light bilaterally. Oral cavity moist, posterior pharynx without exudate, erythema, peritonsillar abscess, or post nasal drip.  Neck: Supple. No thyromegaly. Full ROM. No lymphadenopathy. Lungs: Clear bilaterally to auscultation without wheezes, rales, or rhonchi. Breathing is unlabored. Heart: RRR with S1 S2. No murmurs, rubs, or gallops appreciated. Abdomen: No hepatomegaly. No rebound/guarding. No obvious abdominal masses. Diffusely tender in her abdomen. Hyperactive bowel sounds.  Msk:  Strength and tone normal for age. Extremities/Skin: Warm and dry. No clubbing or cyanosis. No edema. No rashes or suspicious lesions. Neuro: Alert and oriented X 3. Moves all extremities spontaneously. Gait is normal. CNII-XII grossly in tact. Psych:  Responds to questions appropriately with a normal affect.   Labs: Results for orders placed or performed in visit on 03/07/15  POCT CBC  Result Value Ref Range   WBC 4.4 (A) 4.6 - 10.2 K/uL   Lymph, poc 1.6 0.6 - 3.4   POC LYMPH PERCENT 36.2 10 - 50 %L   MID (cbc) 0.3 0 - 0.9   POC MID % 6.8 0 - 12 %M   POC Granulocyte 2.5 2 - 6.9   Granulocyte percent 57.0 37 - 80 %G   RBC 4.50 4.04 - 5.48 M/uL   Hemoglobin 13.3 12.2 - 16.2 g/dL   HCT, POC 38.1 37.7 - 47.9 %   MCV 84.7 80 - 97 fL   MCH, POC 29.5 27 - 31.2 pg   MCHC 34.9 31.8 - 35.4 g/dL   RDW, POC 13.2 %   Platelet Count, POC 262 142 - 424 K/uL   MPV 6.9 0 - 99.8 fL  POCT SEDIMENTATION RATE  Result Value Ref Range   POCT SED RATE 31 (A) 0 - 22 mm/hr    [facility] reading (PRIMARY) by  Dr. Joseph Art:  Well-rounded soft tissue mass in left pelvis.    ASSESSMENT AND PLAN:  65 y.o. year old female with  This chart was scribed in my presence and reviewed by me personally.    ICD-9-CM ICD-10-CM   1. Abdominal pain, epigastric 789.06 R10.13 POCT CBC     COMPLETE METABOLIC  PANEL WITH GFR     POCT SEDIMENTATION RATE     DG Abd 1 View     CT Abdomen Pelvis W Contrast     amoxicillin-clavulanate (AUGMENTIN) 875-125 MG tablet  2. Diverticulosis of colon without hemorrhage 562.10 K57.30 amoxicillin-clavulanate (AUGMENTIN) 875-125 MG tablet      Signed, Robyn Haber, MD 03/07/2015 2:14 PM

## 2015-03-09 ENCOUNTER — Other Ambulatory Visit: Payer: Self-pay | Admitting: Family Medicine

## 2015-03-09 ENCOUNTER — Ambulatory Visit (INDEPENDENT_AMBULATORY_CARE_PROVIDER_SITE_OTHER): Payer: BLUE CROSS/BLUE SHIELD | Admitting: Family Medicine

## 2015-03-09 VITALS — BP 177/83 | HR 71 | Temp 98.9°F | Resp 16 | Ht 64.0 in | Wt 147.0 lb

## 2015-03-09 DIAGNOSIS — K219 Gastro-esophageal reflux disease without esophagitis: Secondary | ICD-10-CM | POA: Diagnosis not present

## 2015-03-09 DIAGNOSIS — I10 Essential (primary) hypertension: Secondary | ICD-10-CM | POA: Diagnosis not present

## 2015-03-09 DIAGNOSIS — K5730 Diverticulosis of large intestine without perforation or abscess without bleeding: Secondary | ICD-10-CM | POA: Diagnosis not present

## 2015-03-09 DIAGNOSIS — R1084 Generalized abdominal pain: Secondary | ICD-10-CM | POA: Diagnosis not present

## 2015-03-09 DIAGNOSIS — R0789 Other chest pain: Secondary | ICD-10-CM | POA: Diagnosis not present

## 2015-03-09 DIAGNOSIS — Z9119 Patient's noncompliance with other medical treatment and regimen: Secondary | ICD-10-CM

## 2015-03-09 DIAGNOSIS — E785 Hyperlipidemia, unspecified: Secondary | ICD-10-CM | POA: Diagnosis not present

## 2015-03-09 DIAGNOSIS — Z91199 Patient's noncompliance with other medical treatment and regimen due to unspecified reason: Secondary | ICD-10-CM

## 2015-03-09 DIAGNOSIS — Z23 Encounter for immunization: Secondary | ICD-10-CM

## 2015-03-09 LAB — LIPID PANEL
Cholesterol: 299 mg/dL — ABNORMAL HIGH (ref 125–200)
HDL: 43 mg/dL — ABNORMAL LOW (ref >=46)
LDL Cholesterol: 219 mg/dL — ABNORMAL HIGH (ref <130)
Total CHOL/HDL Ratio: 7 Ratio — ABNORMAL HIGH (ref <=5.0)
Triglycerides: 186 mg/dL — ABNORMAL HIGH (ref <150)
VLDL: 37 mg/dL — ABNORMAL HIGH (ref <30)

## 2015-03-09 LAB — TSH: TSH: 0.96 mIU/L

## 2015-03-09 MED ORDER — HYDROCODONE-ACETAMINOPHEN 5-325 MG PO TABS: 1.0000 | ORAL_TABLET | Freq: Four times a day (QID) | ORAL | Status: DC | PRN

## 2015-03-09 MED ORDER — AMLODIPINE BESYLATE 10 MG PO TABS: 10.0000 mg | ORAL_TABLET | Freq: Every day | ORAL | Status: DC

## 2015-03-09 MED ORDER — CYCLOBENZAPRINE HCL 5 MG PO TABS
5.0000 mg | ORAL_TABLET | Freq: Three times a day (TID) | ORAL | Status: DC | PRN
Start: 2015-03-09 — End: 2017-06-03

## 2015-03-09 NOTE — Telephone Encounter (Signed)
Pt was seen in office today

## 2015-03-09 NOTE — Patient Instructions (Addendum)
Hypertension Hypertension, commonly called high blood pressure, is when the force of blood pumping through your arteries is too strong. Your arteries are the blood vessels that carry blood from your heart throughout your body. A blood pressure reading consists of a higher number over a lower number, such as 110/72. The higher number (systolic) is the pressure inside your arteries when your heart pumps. The lower number (diastolic) is the pressure inside your arteries when your heart relaxes. Ideally you want your blood pressure below 120/80. Hypertension forces your heart to work harder to pump blood. Your arteries may become narrow or stiff. Having untreated or uncontrolled hypertension can cause heart attack, stroke, kidney disease, and other problems. RISK FACTORS Some risk factors for high blood pressure are controllable. Others are not.  Risk factors you cannot control include:   Race. You may be at higher risk if you are African American.  Age. Risk increases with age.  Gender. Men are at higher risk than women before age 45 years. After age 65, women are at higher risk than men. Risk factors you can control include:  Not getting enough exercise or physical activity.  Being overweight.  Getting too much fat, sugar, calories, or salt in your diet.  Drinking too much alcohol. SIGNS AND SYMPTOMS Hypertension does not usually cause signs or symptoms. Extremely high blood pressure (hypertensive crisis) may cause headache, anxiety, shortness of breath, and nosebleed. DIAGNOSIS To check if you have hypertension, your health care provider will measure your blood pressure while you are seated, with your arm held at the level of your heart. It should be measured at least twice using the same arm. Certain conditions can cause a difference in blood pressure between your right and left arms. A blood pressure reading that is higher than normal on one occasion does not mean that you need treatment. If  it is not clear whether you have high blood pressure, you may be asked to return on a different day to have your blood pressure checked again. Or, you may be asked to monitor your blood pressure at home for 1 or more weeks. TREATMENT Treating high blood pressure includes making lifestyle changes and possibly taking medicine. Living a healthy lifestyle can help lower high blood pressure. You may need to change some of your habits. Lifestyle changes may include:  Following the DASH diet. This diet is high in fruits, vegetables, and whole grains. It is low in salt, red meat, and added sugars.  Keep your sodium intake below 2,300 mg per day.  Getting at least 30-45 minutes of aerobic exercise at least 4 times per week.  Losing weight if necessary.  Not smoking.  Limiting alcoholic beverages.  Learning ways to reduce stress. Your health care provider may prescribe medicine if lifestyle changes are not enough to get your blood pressure under control, and if one of the following is true:  You are 18-59 years of age and your systolic blood pressure is above 140.  You are 60 years of age or older, and your systolic blood pressure is above 150.  Your diastolic blood pressure is above 90.  You have diabetes, and your systolic blood pressure is over 140 or your diastolic blood pressure is over 90.  You have kidney disease and your blood pressure is above 140/90.  You have heart disease and your blood pressure is above 140/90. Your personal target blood pressure may vary depending on your medical conditions, your age, and other factors. HOME CARE INSTRUCTIONS    Have your blood pressure rechecked as directed by your health care provider.   Take medicines only as directed by your health care provider. Follow the directions carefully. Blood pressure medicines must be taken as prescribed. The medicine does not work as well when you skip doses. Skipping doses also puts you at risk for  problems.  Do not smoke.   Monitor your blood pressure at home as directed by your health care provider. SEEK MEDICAL CARE IF:   You think you are having a reaction to medicines taken.  You have recurrent headaches or feel dizzy.  You have swelling in your ankles.  You have trouble with your vision. SEEK IMMEDIATE MEDICAL CARE IF:  You develop a severe headache or confusion.  You have unusual weakness, numbness, or feel faint.  You have severe chest or abdominal pain.  You vomit repeatedly.  You have trouble breathing. MAKE SURE YOU:   Understand these instructions.  Will watch your condition.  Will get help right away if you are not doing well or get worse.   This information is not intended to replace advice given to you by your health care provider. Make sure you discuss any questions you have with your health care provider.   Document Released: 01/17/2005 Document Revised: 06/03/2014 Document Reviewed: 11/09/2012 Elsevier Interactive Patient Education 2016 Elsevier Inc.    Nonspecific Chest Pain  Chest pain can be caused by many different conditions. There is always a chance that your pain could be related to something serious, such as a heart attack or a blood clot in your lungs. Chest pain can also be caused by conditions that are not life-threatening. If you have chest pain, it is very important to follow up with your health care provider. CAUSES  Chest pain can be caused by:  Heartburn.  Pneumonia or bronchitis.  Anxiety or stress.  Inflammation around your heart (pericarditis) or lung (pleuritis or pleurisy).  A blood clot in your lung.  A collapsed lung (pneumothorax). It can develop suddenly on its own (spontaneous pneumothorax) or from trauma to the chest.  Shingles infection (varicella-zoster virus).  Heart attack.  Damage to the bones, muscles, and cartilage that make up your chest wall. This can include:  Bruised bones due to  injury.  Strained muscles or cartilage due to frequent or repeated coughing or overwork.  Fracture to one or more ribs.  Sore cartilage due to inflammation (costochondritis). RISK FACTORS  Risk factors for chest pain may include:  Activities that increase your risk for trauma or injury to your chest.  Respiratory infections or conditions that cause frequent coughing.  Medical conditions or overeating that can cause heartburn.  Heart disease or family history of heart disease.  Conditions or health behaviors that increase your risk of developing a blood clot.  Having had chicken pox (varicella zoster). SIGNS AND SYMPTOMS Chest pain can feel like:  Burning or tingling on the surface of your chest or deep in your chest.  Crushing, pressure, aching, or squeezing pain.  Dull or sharp pain that is worse when you move, cough, or take a deep breath.  Pain that is also felt in your back, neck, shoulder, or arm, or pain that spreads to any of these areas. Your chest pain may come and go, or it may stay constant. DIAGNOSIS Lab tests or other studies may be needed to find the cause of your pain. Your health care provider may have you take a test called an ambulatory ECG (electrocardiogram). An  ECG records your heartbeat patterns at the time the test is performed. You may also have other tests, such as:  Transthoracic echocardiogram (TTE). During echocardiography, sound waves are used to create a picture of all of the heart structures and to look at how blood flows through your heart.  Transesophageal echocardiogram (TEE).This is a more advanced imaging test that obtains images from inside your body. It allows your health care provider to see your heart in finer detail.  Cardiac monitoring. This allows your health care provider to monitor your heart rate and rhythm in real time.  Holter monitor. This is a portable device that records your heartbeat and can help to diagnose abnormal  heartbeats. It allows your health care provider to track your heart activity for several days, if needed.  Stress tests. These can be done through exercise or by taking medicine that makes your heart beat more quickly.  Blood tests.  Imaging tests. TREATMENT  Your treatment depends on what is causing your chest pain. Treatment may include:  Medicines. These may include:  Acid blockers for heartburn.  Anti-inflammatory medicine.  Pain medicine for inflammatory conditions.  Antibiotic medicine, if an infection is present.  Medicines to dissolve blood clots.  Medicines to treat coronary artery disease.  Supportive care for conditions that do not require medicines. This may include:  Resting.  Applying heat or cold packs to injured areas.  Limiting activities until pain decreases. HOME CARE INSTRUCTIONS  If you were prescribed an antibiotic medicine, finish it all even if you start to feel better.  Avoid any activities that bring on chest pain.  Do not use any tobacco products, including cigarettes, chewing tobacco, or electronic cigarettes. If you need help quitting, ask your health care provider.  Do not drink alcohol.  Take medicines only as directed by your health care provider.  Keep all follow-up visits as directed by your health care provider. This is important. This includes any further testing if your chest pain does not go away.  If heartburn is the cause for your chest pain, you may be told to keep your head raised (elevated) while sleeping. This reduces the chance that acid will go from your stomach into your esophagus.  Make lifestyle changes as directed by your health care provider. These may include:  Getting regular exercise. Ask your health care provider to suggest some activities that are safe for you.  Eating a heart-healthy diet. A registered dietitian can help you to learn healthy eating options.  Maintaining a healthy weight.  Managing  diabetes, if necessary.  Reducing stress. SEEK MEDICAL CARE IF:  Your chest pain does not go away after treatment.  You have a rash with blisters on your chest.  You have a fever. SEEK IMMEDIATE MEDICAL CARE IF:   Your chest pain is worse.  You have an increasing cough, or you cough up blood.  You have severe abdominal pain.  You have severe weakness.  You faint.  You have chills.  You have sudden, unexplained chest discomfort.  You have sudden, unexplained discomfort in your arms, back, neck, or jaw.  You have shortness of breath at any time.  You suddenly start to sweat, or your skin gets clammy.  You feel nauseous or you vomit.  You suddenly feel light-headed or dizzy.  Your heart begins to beat quickly, or it feels like it is skipping beats. These symptoms may represent a serious problem that is an emergency. Do not wait to see if the symptoms  will go away. Get medical help right away. Call your local emergency services (911 in the [place] Do not drive yourself to the hospital.   This information is not intended to replace advice given to you by your health care provider. Make sure you discuss any questions you have with your health care provider.   Document Released: 10/27/2004 Document Revised: 02/07/2014 Document Reviewed: 08/23/2013 Elsevier Interactive Patient Education 2016 Elsevier Inc. Abdominal Pain, Adult Many things can cause abdominal pain. Usually, abdominal pain is not caused by a disease and will improve without treatment. It can often be observed and treated at home. Your health care provider will do a physical exam and possibly order blood tests and X-rays to help determine the seriousness of your pain. However, in many cases, more time must pass before a clear cause of the pain can be found. Before that point, your health care provider may not know if you need more testing or further treatment. HOME CARE INSTRUCTIONS Monitor your abdominal pain for  any changes. The following actions may help to alleviate any discomfort you are experiencing:  Only take over-the-counter or prescription medicines as directed by your health care provider.  Do not take laxatives unless directed to do so by your health care provider.  Try a clear liquid diet (broth, tea, or water) as directed by your health care provider. Slowly move to a bland diet as tolerated. SEEK MEDICAL CARE IF:  You have unexplained abdominal pain.  You have abdominal pain associated with nausea or diarrhea.  You have pain when you urinate or have a bowel movement.  You experience abdominal pain that wakes you in the night.  You have abdominal pain that is worsened or improved by eating food.  You have abdominal pain that is worsened with eating fatty foods.  You have a fever. SEEK IMMEDIATE MEDICAL CARE IF:  Your pain does not go away within 2 hours.  You keep throwing up (vomiting).  Your pain is felt only in portions of the abdomen, such as the right side or the left lower portion of the abdomen.  You pass bloody or black tarry stools. MAKE SURE YOU:  Understand these instructions.  Will watch your condition.  Will get help right away if you are not doing well or get worse.   This information is not intended to replace advice given to you by your health care provider. Make sure you discuss any questions you have with your health care provider.   Document Released: 10/27/2004 Document Revised: 10/08/2014 Document Reviewed: 09/26/2012 Elsevier Interactive Patient Education [employer].

## 2015-03-09 NOTE — Progress Notes (Signed)
d     Chief Complaint:  Chief Complaint  Patient presents with  . Abdominal Pain    recheck    HPI: Erin Benitez is a 65 y.o. female who reports to [facility] today complaining of recheck of her abd pain, she was seen here on 2/4 and had a CT abd which showed diverticular disease without acute infection. She was put on augmentin non the less but  cont to have pain. She has taken some left over norco for her arm from way back , she has 2 pills left, she ahhas s no pain releif with this. She took about 3 pills over several days. The abd is  it is diffuse, 7 out 10 m she has had BM, she has had loose stools. She has had flatus, no bloody stools or diarrhea.  She takes goody [facility] powder fairly regular to help with pain, she has a hx of GERD Prior hx of colonscopy in 2016 , + colon polyps and diverticulosis 2016 with Dr Carlean Purl  She also has HTN and last time she has been without for her BP meds without  She has had sharp CP random, no radiation intermittent and abd pain 3 weeks. 1-2 seconds. Not frequent, not always associated with abd pain. She has it midepigastric . No calmminess or sweating.  She has HTN been without meds for 2 months. She also has hyperlipidemia but has not been on meds.      Was seen on 03/07/2015 Erin Benitez is a 65 y.o. female who presents to [facility] complaining of waxing and waning abdominal pain in the upper mid abdomen, gradual onset 3 weeks ago. Pt reports that being in a supine position or movements such as turning sides exacerbates that pain. She indicates that eating food seems to alleviate the pain. She notes that she seems to have a decreased appetite, and states that she has lost about 2 lbs in the past 2 weeks. Pt takes nexuim and gasx, and pain medications from dents extraction that she found relief with. Pt is UTD with colonoscopy less than a year ago. She notes a family history of cancer, however she reports no history of DM. She denies  vomiting, diarrhea, change in stool, urinary symptoms, or blood in the stool.   Pt works as a Pharmacist, hospital part time.   FINDINGS: Visualized lung bases clear. Fatty liver. Small bilateral renal cysts. No hydronephrosis. Unremarkable nondilated gallbladder, spleen, adrenal glands , pancreas, stomach, small bowel, appendix. Colon is nondilated with scattered proximal diverticula. No adjacent inflammatory/ edematous change or abscess. Urinary bladder is distended. Uterus and adnexal regions unremarkable. No ascites. No free air. Patchy aortoiliac arterial calcifications. No adenopathy. Portal vein patent. Lumbar spine intact.  IMPRESSION: 1. No acute abdominal process. 2. Scattered colonic diverticula without abscess.   Electronically Signed  By: Lucrezia Europe M.D.  On: 03/07/2015 13:40  Past Medical History  Diagnosis Date  . Hypertension   . Bronchitis   . Asthma   . Allergy   . GERD (gastroesophageal reflux disease)   . Heart murmur    Past Surgical History  Procedure Laterality Date  . Cesarean section    . Tonsillectomy     Social History   Social History  . Marital Status: Married    Spouse Name: N/A  . Number of Children: 1  . Years of Education: N/A   Occupational History  . Educator    Social History Main Topics  . Smoking status: Never  Smoker   . Smokeless tobacco: Never Used  . Alcohol Use: No  . Drug Use: No  . Sexual Activity: Not Currently   Other Topics Concern  . None   Social History Narrative   She reports she is single. She has 1 daughter. She is an Tourist information centre manager. 2 caffeinated beverages daily.   Family History  Problem Relation Age of Onset  . Cancer Mother     ovarian cancer  . Heart disease Mother   . Liver disease Mother   . Cancer Father     lung cancer  . Cancer Sister   . Diabetes Sister   . Cancer Sister   . Diabetes Maternal Aunt   . Colon cancer Maternal Aunt     x2 aunts  . Colon cancer Maternal Uncle     2 uncles  .  Colon cancer Paternal Grandfather   . Esophageal cancer Neg Hx   . Rectal cancer Neg Hx   . Stomach cancer Neg Hx    Allergies  Allergen Reactions  . Peanut-Containing Drug Products Anaphylaxis, Hives, Swelling and Other (See Comments)    Pistachio's - swelling of the throat     . Tramadol Nausea And Vomiting    Headache & dizziness with n&v   Prior to Admission medications   Medication Sig Start Date End Date Taking? Authorizing Provider  amoxicillin-clavulanate (AUGMENTIN) 875-125 MG tablet Take 1 tablet by mouth 2 (two) times daily. 03/07/15  Yes Robyn Haber, MD  esomeprazole (NEXIUM) 20 MG capsule Take 40 mg by mouth daily at 12 noon. Reported on 03/07/2015   Yes Historical Provider, MD  amLODipine (NORVASC) 10 MG tablet Take 1 tablet (10 mg total) by mouth daily. Patient not taking: Reported on 03/09/2015 04/01/13   Orma Flaming, MD     ROS: The patient denies fevers, chills, night sweats, unintentional weight loss, palpitations, wheezing, dyspnea on exertion, nausea, vomiting, dysuria, hematuria, melena, numbness, weakness, or tingling.   All other systems have been reviewed and were otherwise negative with the exception of those mentioned in the HPI and as above.    PHYSICAL EXAM: Filed Vitals:   03/09/15 0858 03/09/15 0859  BP: 177/96 177/83  Pulse: 77 71  Temp: 98.9 F (37.2 C)   Resp: 16    Body mass index is 25.22 kg/(m^2).   General: Alert, no acute distress HEENT:  Normocephalic, atraumatic, oropharynx patent. EOMI, PERRLA Tm nl, no exudates.  Cardiovascular:  Regular rate and rhythm, no rubs murmurs or gallops.  No Carotid bruits, radial pulse intact. No pedal edema.  Respiratory: Clear to auscultation bilaterally.  No wheezes, rales, or rhonchi.  No cyanosis, no use of accessory musculature Abdominal: No organomegaly, abdomen is soft and diffusel tender, positive bowel sounds. No masses. Skin: No rashes. Neurologic: Facial musculature symmetric. Psychiatric:  Patient acts appropriately throughout our interaction. Lymphatic: No cervical or submandibular lymphadenopathy Musculoskeletal: Gait intact. No edema, tenderness   LABS: Results for orders placed or performed in visit on 03/07/15  COMPLETE METABOLIC PANEL WITH GFR  Result Value Ref Range   Sodium 138 135 - 146 mmol/L   Potassium 4.1 3.5 - 5.3 mmol/L   Chloride 102 98 - 110 mmol/L   CO2 27 20 - 31 mmol/L   Glucose, Bld 123 (H) 65 - 99 mg/dL   BUN 14 7 - 25 mg/dL   Creat 0.57 0.50 - 0.99 mg/dL   Total Bilirubin 0.6 0.2 - 1.2 mg/dL   Alkaline Phosphatase 87 33 - 130  U/L   AST 15 10 - 35 U/L   ALT 15 6 - 29 U/L   Total Protein 7.1 6.1 - 8.1 g/dL   Albumin 4.3 3.6 - 5.1 g/dL   Calcium 9.0 8.6 - 10.4 mg/dL   GFR, Est African American >89 >=60 mL/min   GFR, Est Non African American >89 >=60 mL/min  POCT CBC  Result Value Ref Range   WBC 4.4 (A) 4.6 - 10.2 K/uL   Lymph, poc 1.6 0.6 - 3.4   POC LYMPH PERCENT 36.2 10 - 50 %L   MID (cbc) 0.3 0 - 0.9   POC MID % 6.8 0 - 12 %M   POC Granulocyte 2.5 2 - 6.9   Granulocyte percent 57.0 37 - 80 %G   RBC 4.50 4.04 - 5.48 M/uL   Hemoglobin 13.3 12.2 - 16.2 g/dL   HCT, POC 38.1 37.7 - 47.9 %   MCV 84.7 80 - 97 fL   MCH, POC 29.5 27 - 31.2 pg   MCHC 34.9 31.8 - 35.4 g/dL   RDW, POC 13.2 %   Platelet Count, POC 262 142 - 424 K/uL   MPV 6.9 0 - 99.8 fL  POCT SEDIMENTATION RATE  Result Value Ref Range   POCT SED RATE 31 (A) 0 - 22 mm/hr     EKG/XRAY:   Primary read interpreted by Dr. Marin Comment at [facility].   ASSESSMENT/PLAN: Encounter Diagnoses  Name Primary?  . Need for prophylactic vaccination and inoculation against influenza   . Essential hypertension   . Generalized abdominal pain Yes  . Diverticulosis of colon without hemorrhage   . Atypical chest pain   . Hyperlipidemia   . Gastroesophageal reflux disease without esophagitis   . Noncompliance    Refer to cardiology for atypical CP, stop NSAID, take PPI for GERD sxs prn ,  precautions given to go to ER prn  EKG nonspecific T wave changes in leads v1-2 Start statin if cholesterol is high Rx norco , flexeril, take otc stool softener Refill norvasc Take BP cuff and pulse measurements Add lipid, TSH to prior blood work Fu prn otherwise in 1 month for BP recheck, if cont need to see Dr Vicki Mallet sideeffects, risk and benefits, and alternatives of medications d/w patient. Patient is aware that all medications have potential sideeffects and we are unable to predict every sideeffect or drug-drug interaction that may occur.  Cherysh Epperly DO  03/09/2015 9:40 AM

## 2015-03-12 ENCOUNTER — Telehealth: Payer: Self-pay

## 2015-03-12 NOTE — Telephone Encounter (Signed)
LE - Pt said you wrote her a note to return to work, but they are requiring a form to be filled out also.  I have left the form in you box.  Please call her to pick up when ready [phone]

## 2015-03-14 ENCOUNTER — Telehealth: Payer: Self-pay | Admitting: Family Medicine

## 2015-03-14 MED ORDER — ROSUVASTATIN CALCIUM 10 MG PO TABS: 10.0000 mg | ORAL_TABLET | Freq: Every day | ORAL | Status: DC

## 2015-03-14 NOTE — Telephone Encounter (Signed)
Spoke to patient she is much better, BP well controlled. Wants to go back to work without restrictions. I will go ahead and put her on  Low dose statin and she will see endocrinology since Dr Joseph Art had seen her for it. FMLA filled out

## 2015-03-14 NOTE — Telephone Encounter (Signed)
Patient needs note to return  To work on Monday     [phone]

## 2015-03-14 NOTE — Telephone Encounter (Signed)
Patient called and wants her form to be done today. [phone]

## 2015-03-16 ENCOUNTER — Telehealth: Payer: Self-pay

## 2015-03-16 NOTE — Telephone Encounter (Signed)
Waiting on payment of $34.75 from [employer].

## 2015-03-17 NOTE — Telephone Encounter (Signed)
Dr. Marin Comment filled out FMLA. I printed letter for patient to return to work 03/16/15 - lmom on machine that letter is ready to pick up if she needs it still.

## 2015-03-22 NOTE — Progress Notes (Addendum)
Cardiology Office Note  Date:  03/23/2015   ID:  Erin Benitez, DOB 01/31/1951, MRN [id number]  PCP:  Leotis Pain, DO  Cardiologist:  Constance Haw, MD    Chief Complaint  Patient presents with  . Chest Pain     History of Present Illness: Erin Benitez is a 65 y.o. female who presents today for cardiology evaluation.   She has been having upper abdominal pain.  She had an abdominal CT scan showing no evidence diverticular disease without infection.  Augmentin was prescribed without change in the pain.  She represented to urgent care with abdominal pain two days later.  The pain she has tried Norco without relief.  The pain is diffuse, 7-10 minutes.  She has associated sharp chest pain without radiation and is intermittent.  It has been occurring for 3 weeks; lasts 1-2 seconds.  She says that the pain that she has in her chest is in the center of her chest and worsens with lying on her side. She says that it is a dull pain that does not last very long, only a few seconds. She also says that it occurs at times when she is up and moving around, but this is not reproducible. She is able to walk on flat ground without problems, and can climb a flight of stairs with some shortness of breath at the top.  Today, she denies symptoms of palpitations, chest pain, shortness of breath, orthopnea, PND, lower extremity edema, claudication, dizziness, presyncope, syncope, bleeding, or neurologic sequela. The patient is tolerating medications without difficulties and is otherwise without complaint today.    Past Medical History  Diagnosis Date  . Hypertension   . Bronchitis   . Asthma   . Allergy   . GERD (gastroesophageal reflux disease)   . Heart murmur    Past Surgical History  Procedure Laterality Date  . Cesarean section    . Tonsillectomy       Current Outpatient Prescriptions  Medication Sig Dispense Refill  . amLODipine (NORVASC) 10 MG tablet Take 1 tablet (10 mg total) by mouth  daily. 90 tablet 3  . cyclobenzaprine (FLEXERIL) 5 MG tablet Take 1 tablet (5 mg total) by mouth 3 (three) times daily as needed for muscle spasms. 30 tablet 0  . esomeprazole (NEXIUM) 20 MG capsule Take 40 mg by mouth daily at 12 noon. Reported on 03/07/2015    . HYDROcodone-acetaminophen (NORCO) 5-325 MG tablet Take 1 tablet by mouth every 6 (six) hours as needed for moderate pain. Don't take with tylenol, take with stool softener. 30 tablet 0  . rosuvastatin (CRESTOR) 10 MG tablet Take 1 tablet (10 mg total) by mouth daily. Start taking after finish with antibiotics, needs follow up in 2 months 30 tablet 2  . gabapentin (NEURONTIN) 300 MG capsule Take 1 capsule by mouth 3 (three) times daily as needed. Reported on 03/23/2015  1   No current facility-administered medications for this visit.    Allergies:   Peanut-containing drug products and Tramadol   Social History:  The patient  reports that she has never smoked. She has never used smokeless tobacco. She reports that she does not drink alcohol or use illicit drugs.   Family History:  The patient's family history includes Cancer in her father, mother, sister, and sister; Colon cancer in her maternal aunt, maternal uncle, and paternal grandfather; Diabetes in her maternal aunt and sister; Heart disease in her mother; Liver disease in her mother. There is no  history of Esophageal cancer, Rectal cancer, or Stomach cancer.    ROS:  Please see the history of present illness.   Otherwise, review of systems is positive for chest pain, palpitations, DOE, abdominal pain, headaches.   All other systems are reviewed and negative.    PHYSICAL EXAM: VS:  BP 138/92 mmHg  Pulse 84  Ht 5\' 4"  (1.626 m)  Wt 148 lb 6.4 oz (67.314 kg)  BMI 25.46 kg/m2 , BMI Body mass index is 25.46 kg/(m^2). GEN: Well nourished, well developed, in no acute distress HEENT: normal Neck: no JVD, carotid bruits, or masses Cardiac: RRR; no murmurs, rubs, or gallops,no edema    Respiratory:  clear to auscultation bilaterally, normal work of breathing GI: soft, nontender, nondistended, + BS MS: no deformity or atrophy, pain with palpation of the sternum Skin: warm and dry Neuro:  Strength and sensation are intact Psych: euthymic mood, full affect  EKG:  EKG is ordered today. The ekg ordered today shows sinus rhythm, nonspecific T wave flattening  Recent Labs: 03/07/2015: ALT 15; BUN 14; Creat 0.57; Hemoglobin 13.3; Potassium 4.1; Sodium 138; TSH 0.96    Lipid Panel     Component Value Date/Time   CHOL 299* 03/07/2015 1016   TRIG 186* 03/07/2015 1016   HDL 43* 03/07/2015 1016   CHOLHDL 7.0* 03/07/2015 1016   VLDL 37* 03/07/2015 1016   LDLCALC 219* 03/07/2015 1016     Wt Readings from Last 3 Encounters:  03/23/15 148 lb 6.4 oz (67.314 kg)  03/09/15 147 lb (66.679 kg)  03/07/15 149 lb 9.6 oz (67.858 kg)    ASSESSMENT AND PLAN:  1.  Chest pain: Patient with chest pain at rest. Pain does not happen with exertion reproducibly and is not improved with rest. She says that the pain is worse when she lays on her left side. This is a very atypical type of chest pain, especially since it is reproducible with palpation of the chest. Due to that it is unlikely this is cardiac in nature. At this time no further testing is indicated. It is likely of the chest pain is due to musculoskeletal causes, and I have told her to take naproxen over-the-counter twice daily which should help improve the discomfort.    Current medicines are reviewed at length with the patient today.   The patient does not have concerns regarding her medicines.  The following changes were made today:  Naproxen BID for one week  Labs/ tests ordered today include:  Orders Placed This Encounter  Procedures  . EKG 12-Lead     Disposition:   FU with Ryler Laskowski PRN  Signed, Adelyne Marchese Meredith Leeds, MD  03/23/2015 8:09 AM     [facility] [address] [place]  [address] [phone] (office) [phone] (fax)

## 2015-03-23 ENCOUNTER — Encounter: Payer: Self-pay | Admitting: Cardiology

## 2015-03-23 ENCOUNTER — Ambulatory Visit (INDEPENDENT_AMBULATORY_CARE_PROVIDER_SITE_OTHER): Payer: BLUE CROSS/BLUE SHIELD | Admitting: Cardiology

## 2015-03-23 VITALS — BP 138/92 | HR 84 | Ht 64.0 in | Wt 148.4 lb

## 2015-03-23 DIAGNOSIS — R079 Chest pain, unspecified: Secondary | ICD-10-CM

## 2015-03-23 NOTE — Patient Instructions (Signed)
Medication Instructions:  Your physician has recommended you make the following change in your medication:  1) Take Naproxen twice a day for 7 days.  Labwork: None ordered  Testing/Procedures: None ordered  Follow-Up: No follow up is needed at this time with [facility].  We will see you on an as needed basis.  If you need a refill on your cardiac medications before your next appointment, please call your pharmacy.  Thank you for choosing [facility]!!   Trinidad Curet, RN [phone]

## 2015-03-25 NOTE — Telephone Encounter (Signed)
Payment received and records faxed over on 03/25/15

## 2015-03-27 DIAGNOSIS — Z0271 Encounter for disability determination: Secondary | ICD-10-CM

## 2015-04-02 ENCOUNTER — Ambulatory Visit: Payer: BLUE CROSS/BLUE SHIELD | Admitting: Internal Medicine

## 2016-01-27 DIAGNOSIS — S0501XA Injury of conjunctiva and corneal abrasion without foreign body, right eye, initial encounter: Secondary | ICD-10-CM | POA: Diagnosis not present

## 2016-01-29 DIAGNOSIS — S0501XD Injury of conjunctiva and corneal abrasion without foreign body, right eye, subsequent encounter: Secondary | ICD-10-CM | POA: Diagnosis not present

## 2016-03-20 ENCOUNTER — Encounter (HOSPITAL_COMMUNITY): Payer: Self-pay

## 2016-03-20 ENCOUNTER — Emergency Department (HOSPITAL_COMMUNITY)
Admission: EM | Admit: 2016-03-20 | Discharge: 2016-03-20 | Disposition: A | Discharge status: Home / Self Care | Payer: Medicare Other | Attending: Emergency Medicine | Admitting: Emergency Medicine

## 2016-03-20 DIAGNOSIS — Z79899 Other long term (current) drug therapy: Secondary | ICD-10-CM | POA: Diagnosis not present

## 2016-03-20 DIAGNOSIS — Z91010 Allergy to peanuts: Secondary | ICD-10-CM | POA: Insufficient documentation

## 2016-03-20 DIAGNOSIS — M25512 Pain in left shoulder: Secondary | ICD-10-CM | POA: Diagnosis present

## 2016-03-20 DIAGNOSIS — I10 Essential (primary) hypertension: Secondary | ICD-10-CM | POA: Diagnosis not present

## 2016-03-20 DIAGNOSIS — M62838 Other muscle spasm: Secondary | ICD-10-CM

## 2016-03-20 DIAGNOSIS — M791 Myalgia: Secondary | ICD-10-CM | POA: Diagnosis not present

## 2016-03-20 DIAGNOSIS — J45909 Unspecified asthma, uncomplicated: Secondary | ICD-10-CM | POA: Diagnosis not present

## 2016-03-20 MED ORDER — IBUPROFEN 800 MG PO TABS
800.0000 mg | ORAL_TABLET | Freq: Once | ORAL | Status: AC
  Administered 2016-03-20: 800 mg via ORAL
  Filled 2016-03-20: qty 1

## 2016-03-20 MED ORDER — ACETAMINOPHEN 500 MG PO TABS
1000.0000 mg | ORAL_TABLET | Freq: Once | ORAL | Status: AC
  Administered 2016-03-20: 1000 mg via ORAL
  Filled 2016-03-20: qty 2

## 2016-03-20 NOTE — ED Notes (Signed)
Bed: WTR5 Expected date:  Expected time:  Means of arrival:  Comments: 

## 2016-03-20 NOTE — ED Provider Notes (Signed)
[place] DEPT Provider Note   CSN: [id number] Arrival date & time: 03/20/16  1002     History   Chief Complaint Chief Complaint  Patient presents with  . Arm Pain    HPI Erin Benitez is a 66 y.o. female.  66 yo F with a chief complaint of left trapezius pain. Going on for the past week or so. Patient has taken Tylenol and ibuprofen episodically with no improvement. She started taking gabapentin yesterday and is upset that is not any better. She denies injury. Denies weakness. Denies neck pain.   The history is provided by the patient.  Arm Pain  This is a new problem. The current episode started less than 1 hour ago. The problem occurs constantly. The problem has not changed since onset.Pertinent negatives include no chest pain, no headaches and no shortness of breath. Nothing aggravates the symptoms. Nothing relieves the symptoms. She has tried nothing for the symptoms. The treatment provided no relief.    Past Medical History:  Diagnosis Date  . Allergy   . Asthma   . Bronchitis   . GERD (gastroesophageal reflux disease)   . Heart murmur   . Hypertension     Patient Active Problem List   Diagnosis Date Noted  . Diverticulosis of colon without hemorrhage 03/09/2015  . Atypical chest pain 03/09/2015  . Hyperlipidemia 03/09/2015  . Gastroesophageal reflux disease without esophagitis 03/09/2015  . HTN (hypertension) 04/01/2013  . Asthma 04/01/2013    Past Surgical History:  Procedure Laterality Date  . CESAREAN SECTION    . COLONOSCOPY    . TONSILLECTOMY      OB History    No data available       Home Medications    Prior to Admission medications   Medication Sig Start Date End Date Taking? Authorizing Provider  amLODipine (NORVASC) 10 MG tablet Take 1 tablet (10 mg total) by mouth daily. 03/09/15   Thao P Le, DO  cyclobenzaprine (FLEXERIL) 5 MG tablet Take 1 tablet (5 mg total) by mouth 3 (three) times daily as needed for muscle spasms. 03/09/15   Thao  P Le, DO  esomeprazole (NEXIUM) 20 MG capsule Take 40 mg by mouth daily at 12 noon. Reported on 03/07/2015    Historical Provider, MD  gabapentin (NEURONTIN) 300 MG capsule Take 1 capsule by mouth 3 (three) times daily as needed. Reported on 03/23/2015 02/10/15   Historical Provider, MD  HYDROcodone-acetaminophen (NORCO) 5-325 MG tablet Take 1 tablet by mouth every 6 (six) hours as needed for moderate pain. Don't take with tylenol, take with stool softener. 03/09/15   Thao P Le, DO  rosuvastatin (CRESTOR) 10 MG tablet Take 1 tablet (10 mg total) by mouth daily. Start taking after finish with antibiotics, needs follow up in 2 months 03/14/15   Thao P Le, DO    Family History Family History  Problem Relation Age of Onset  . Cancer Mother     ovarian cancer  . Heart disease Mother   . Liver disease Mother   . Cancer Father     lung cancer  . Cancer Sister   . Diabetes Sister   . Cancer Sister   . Diabetes Maternal Aunt   . Colon cancer Maternal Aunt     x2 aunts  . Colon cancer Maternal Uncle     2 uncles  . Colon cancer Paternal Grandfather   . Esophageal cancer Neg Hx   . Rectal cancer Neg Hx   . Stomach cancer  Neg Hx     Social History Social History  Substance Use Topics  . Smoking status: Never Smoker  . Smokeless tobacco: Never Used  . Alcohol use No     Allergies   Peanut-containing drug products and Tramadol   Review of Systems Review of Systems  Constitutional: Negative for chills and fever.  HENT: Negative for congestion and rhinorrhea.   Eyes: Negative for redness and visual disturbance.  Respiratory: Negative for shortness of breath and wheezing.   Cardiovascular: Negative for chest pain and palpitations.  Gastrointestinal: Negative for nausea and vomiting.  Genitourinary: Negative for dysuria and urgency.  Musculoskeletal: Positive for arthralgias and myalgias.  Skin: Negative for pallor and wound.  Neurological: Negative for dizziness and headaches.      Physical Exam Updated Vital Signs BP (!) 189/103 (BP Location: Left Arm)   Pulse 80   Temp 97.4 F (36.3 C) (Oral)   Resp 18   Ht 5\' 4"  (1.626 m)   Wt 145 lb (65.8 kg)   SpO2 99%   BMI 24.89 kg/m   Physical Exam  Constitutional: She is oriented to person, place, and time. She appears well-developed and well-nourished. No distress.  HENT:  Head: Normocephalic and atraumatic.  Eyes: EOM are normal. Pupils are equal, round, and reactive to light.  Neck: Normal range of motion. Neck supple.  Cardiovascular: Normal rate and regular rhythm.  Exam reveals no gallop and no friction rub.   No murmur heard. Pulmonary/Chest: Effort normal. She has no wheezes. She has no rales.  Abdominal: Soft. She exhibits no distension and no mass. There is no tenderness. There is no guarding.  Musculoskeletal: She exhibits tenderness. She exhibits no edema.  TTP about the left trapezius muscle belly, reproduces pain.  PMS intact distally, negative tinells, negative spurlings.   Neurological: She is alert and oriented to person, place, and time.  Skin: Skin is warm and dry. She is not diaphoretic.  Psychiatric: She has a normal mood and affect. Her behavior is normal.  Nursing note and vitals reviewed.    ED Treatments / Results  Labs (all labs ordered are listed, but only abnormal results are displayed) Labs Reviewed - No data to display  EKG  EKG Interpretation None       Radiology No results found.  Procedures Procedures (including critical care time)  Medications Ordered in ED Medications  acetaminophen (TYLENOL) tablet 1,000 mg (not administered)  ibuprofen (ADVIL,MOTRIN) tablet 800 mg (not administered)     Initial Impression / Assessment and Plan / ED Course  I have reviewed the triage vital signs and the nursing notes.  Pertinent labs & imaging results that were available during my care of the patient were reviewed by me and considered in my medical decision making  (see chart for details).     66 yo F With a chief complaint of left trapezius pain. We'll place in sling Tylenol ibuprofen. PCP follow-up.  11:08 AM:  I have discussed the diagnosis/risks/treatment options with the patient and family and believe the pt to be eligible for discharge home to follow-up with PCP. We also discussed returning to the ED immediately if new or worsening sx occur. We discussed the sx which are most concerning (e.g., sudden worsening pain, fever, inability to tolerate by mouth) that necessitate immediate return. Medications administered to the patient during their visit and any new prescriptions provided to the patient are listed below.  Medications given during this visit Medications  acetaminophen (TYLENOL) tablet 1,000 mg (  not administered)  ibuprofen (ADVIL,MOTRIN) tablet 800 mg (not administered)     The patient appears reasonably screen and/or stabilized for discharge and I doubt any other medical condition or other [facility] requiring further screening, evaluation, or treatment in the ED at this time prior to discharge.    Final Clinical Impressions(s) / ED Diagnoses   Final diagnoses:  Trapezius muscle spasm    New Prescriptions New Prescriptions   No medications on file     Deno Etienne, DO 03/20/16 1108

## 2016-03-20 NOTE — ED Triage Notes (Signed)
Patient c/o left arm pain and intermittent left leg pain x 2 weeks. Patietn reports intermittent numbness and aching. Patient states she is unable to sleep on the left side.

## 2016-03-20 NOTE — Discharge Instructions (Signed)
Take 4 over the counter ibuprofen tablets 3 times a day or 2 over-the-counter naproxen tablets twice a day for pain. Also take tylenol 1000mg(2 extra strength) four times a day.    

## 2016-11-04 DIAGNOSIS — H1032 Unspecified acute conjunctivitis, left eye: Secondary | ICD-10-CM | POA: Diagnosis not present

## 2016-12-07 DIAGNOSIS — H2511 Age-related nuclear cataract, right eye: Secondary | ICD-10-CM | POA: Diagnosis not present

## 2016-12-07 DIAGNOSIS — H04123 Dry eye syndrome of bilateral lacrimal glands: Secondary | ICD-10-CM | POA: Diagnosis not present

## 2016-12-07 DIAGNOSIS — H01001 Unspecified blepharitis right upper eyelid: Secondary | ICD-10-CM | POA: Diagnosis not present

## 2016-12-07 DIAGNOSIS — H2512 Age-related nuclear cataract, left eye: Secondary | ICD-10-CM | POA: Diagnosis not present

## 2016-12-08 DIAGNOSIS — Z9841 Cataract extraction status, right eye: Secondary | ICD-10-CM | POA: Diagnosis not present

## 2016-12-08 DIAGNOSIS — Z961 Presence of intraocular lens: Secondary | ICD-10-CM | POA: Diagnosis not present

## 2016-12-08 DIAGNOSIS — H2511 Age-related nuclear cataract, right eye: Secondary | ICD-10-CM | POA: Diagnosis not present

## 2016-12-08 DIAGNOSIS — H52221 Regular astigmatism, right eye: Secondary | ICD-10-CM | POA: Diagnosis not present

## 2016-12-15 DIAGNOSIS — H25811 Combined forms of age-related cataract, right eye: Secondary | ICD-10-CM | POA: Diagnosis not present

## 2016-12-15 DIAGNOSIS — H2511 Age-related nuclear cataract, right eye: Secondary | ICD-10-CM | POA: Diagnosis not present

## 2017-01-12 DIAGNOSIS — H25812 Combined forms of age-related cataract, left eye: Secondary | ICD-10-CM | POA: Diagnosis not present

## 2017-01-12 DIAGNOSIS — H52223 Regular astigmatism, bilateral: Secondary | ICD-10-CM | POA: Diagnosis not present

## 2017-01-12 DIAGNOSIS — H2512 Age-related nuclear cataract, left eye: Secondary | ICD-10-CM | POA: Diagnosis not present

## 2017-02-11 IMAGING — CR DG ABDOMEN 1V
1 series · 1 of 1 positions shown · non-contrast
Comparison: CT scan of March 04, 2014.

CLINICAL DATA: Epigastric abdominal pain for 2 months.

EXAM:
ABDOMEN - 1 VIEW

[AP]
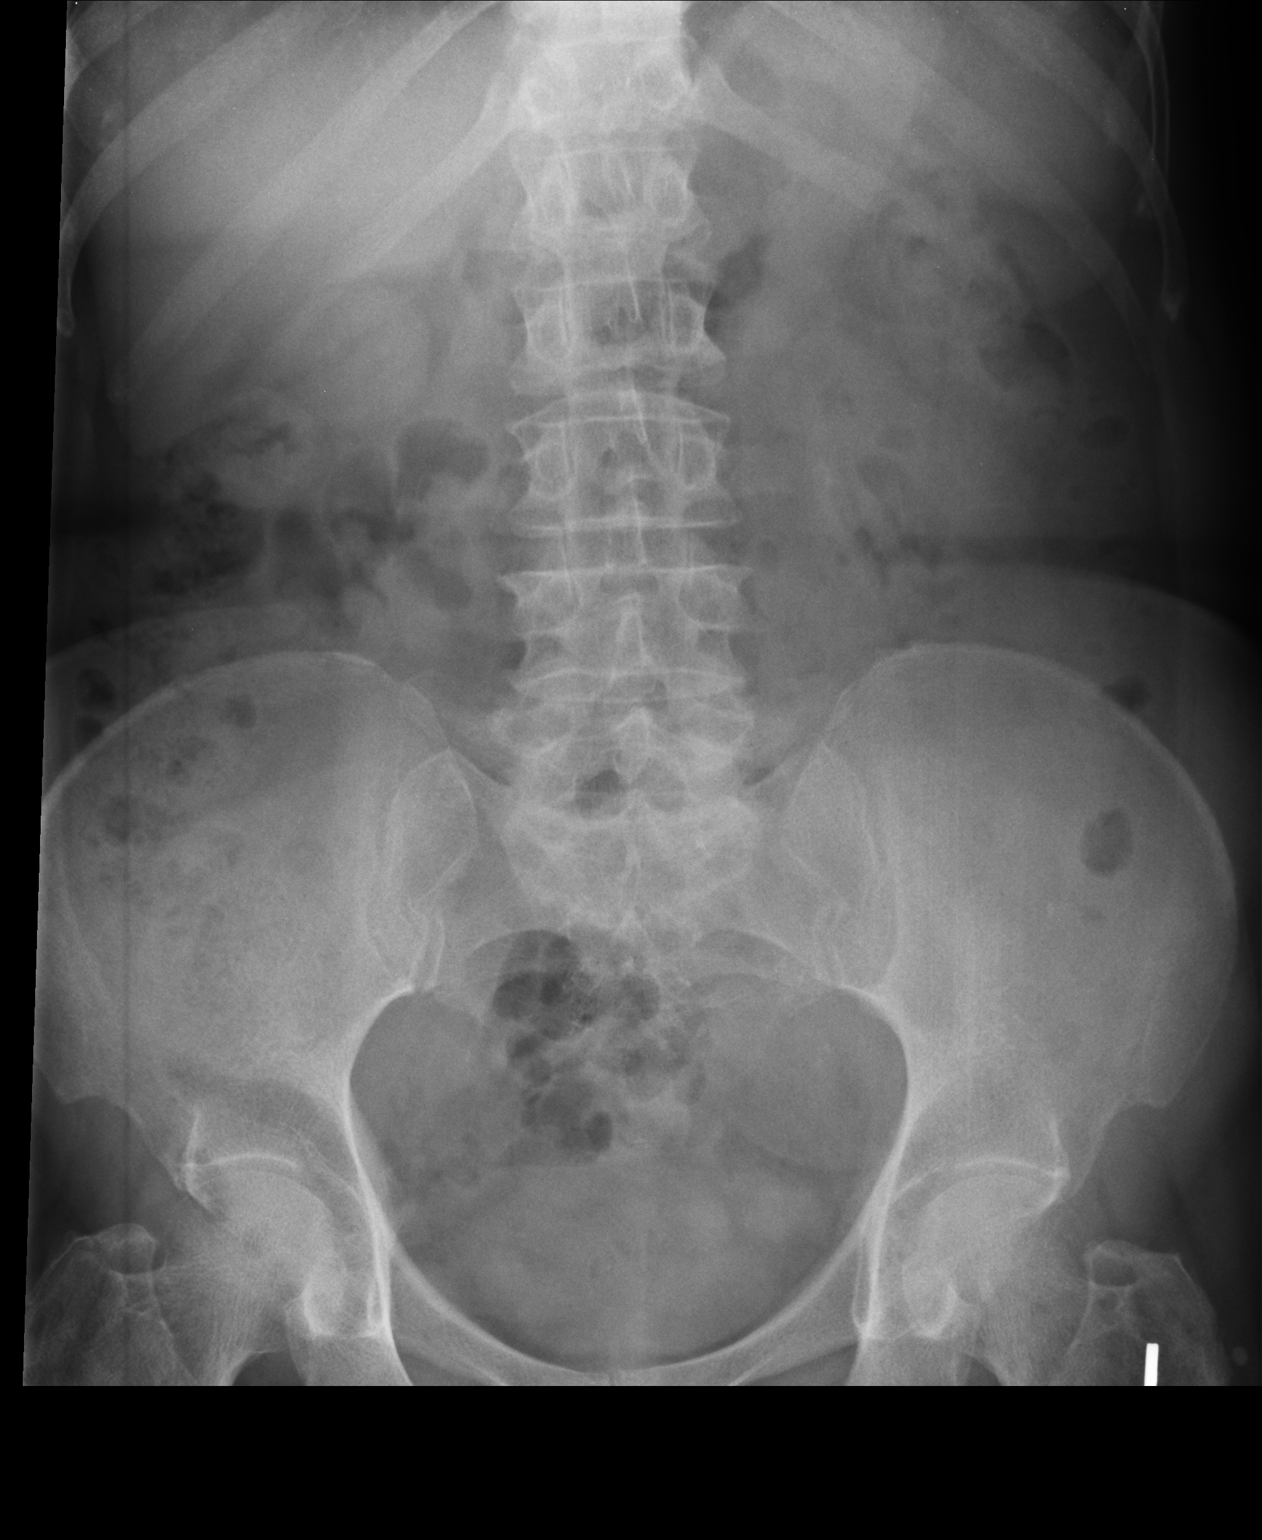

[1 of 1 positions shown; findings below may reference images not displayed]

FINDINGS: The bowel gas pattern is normal. No radio-opaque calculi or other
significant radiographic abnormality are seen.
IMPRESSION: No evidence of bowel obstruction or ileus.

## 2017-06-03 ENCOUNTER — Encounter: Payer: Self-pay | Admitting: Physician Assistant

## 2017-06-03 ENCOUNTER — Ambulatory Visit (INDEPENDENT_AMBULATORY_CARE_PROVIDER_SITE_OTHER): Payer: Medicare Other | Admitting: Physician Assistant

## 2017-06-03 ENCOUNTER — Other Ambulatory Visit: Payer: Self-pay

## 2017-06-03 VITALS — BP 180/100 | HR 60 | Temp 98.5°F | Resp 16 | Ht 63.5 in | Wt 142.0 lb

## 2017-06-03 DIAGNOSIS — G8929 Other chronic pain: Secondary | ICD-10-CM | POA: Diagnosis not present

## 2017-06-03 DIAGNOSIS — E782 Mixed hyperlipidemia: Secondary | ICD-10-CM | POA: Diagnosis not present

## 2017-06-03 DIAGNOSIS — R011 Cardiac murmur, unspecified: Secondary | ICD-10-CM

## 2017-06-03 DIAGNOSIS — Z9189 Other specified personal risk factors, not elsewhere classified: Secondary | ICD-10-CM

## 2017-06-03 DIAGNOSIS — Z87898 Personal history of other specified conditions: Secondary | ICD-10-CM

## 2017-06-03 DIAGNOSIS — R7309 Other abnormal glucose: Secondary | ICD-10-CM | POA: Diagnosis not present

## 2017-06-03 DIAGNOSIS — R1013 Epigastric pain: Secondary | ICD-10-CM

## 2017-06-03 DIAGNOSIS — I161 Hypertensive emergency: Secondary | ICD-10-CM | POA: Diagnosis not present

## 2017-06-03 LAB — POCT URINALYSIS DIP (MANUAL ENTRY)
BILIRUBIN UA: NEGATIVE
Blood, UA: NEGATIVE
Glucose, UA: 100 mg/dL — AB
Ketones, POC UA: NEGATIVE mg/dL
Leukocytes, UA: NEGATIVE
NITRITE UA: NEGATIVE
PH UA: 6.5 (ref 5.0–8.0)
Protein Ur, POC: NEGATIVE mg/dL
Spec Grav, UA: 1.02 (ref 1.010–1.025)
UROBILINOGEN UA: 0.2 U/dL

## 2017-06-03 MED ORDER — BLOOD PRESSURE MONITOR KIT: 1.0000 [IU] | PACK | Freq: Every day | 0 refills | Status: DC

## 2017-06-03 MED ORDER — ROSUVASTATIN CALCIUM 10 MG PO TABS: 10.0000 mg | ORAL_TABLET | Freq: Every day | ORAL | 3 refills | Status: DC

## 2017-06-03 MED ORDER — AMLODIPINE BESYLATE 10 MG PO TABS: 5.0000 mg | ORAL_TABLET | Freq: Every day | ORAL | 0 refills | Status: DC

## 2017-06-03 MED ORDER — HYDROCHLOROTHIAZIDE 12.5 MG PO CAPS: 12.5000 mg | ORAL_CAPSULE | Freq: Every day | ORAL | 0 refills | Status: DC

## 2017-06-03 NOTE — Patient Instructions (Addendum)
In terms of elevated blood pressure, I would like you to start amlodipine 5mg  (half of the 10mg  tablet I prescribed) and hctz 12.5 mg daily. Start checking your blood pressure at least a couple times over the next couple of days ad document these values. It is best if you check the blood pressure at different times in the day. Your goal is <140/90. Return on Monday for reevaluation. If you start to have chest pain, blurred vision, shortness of breath, severe headache, lower leg swelling, or nausea/vomiting please seek care immediately here or at the ED.   I have placed referral to cardiology. They should contact you within the next week for appointment.   Thank you for letting me participate in your health and well being.  Managing Your Hypertension Hypertension is commonly called high blood pressure. This is when the force of your blood pressing against the walls of your arteries is too strong. Arteries are blood vessels that carry blood from your heart throughout your body. Hypertension forces the heart to work harder to pump blood, and may cause the arteries to become narrow or stiff. Having untreated or uncontrolled hypertension can cause heart attack, stroke, kidney disease, and other problems. What are blood pressure readings? A blood pressure reading consists of a higher number over a lower number. Ideally, your blood pressure should be below 120/80. The first ("top") number is called the systolic pressure. It is a measure of the pressure in your arteries as your heart beats. The second ("bottom") number is called the diastolic pressure. It is a measure of the pressure in your arteries as the heart relaxes. What does my blood pressure reading mean? Blood pressure is classified into four stages. Based on your blood pressure reading, your health care provider may use the following stages to determine what type of treatment you need, if any. Systolic pressure and diastolic pressure are measured in a  unit called mm Hg. Normal  Systolic pressure: below 619.  Diastolic pressure: below 80. Elevated  Systolic pressure: 509-326.  Diastolic pressure: below 80. Hypertension stage 1  Systolic pressure: 712-458.  Diastolic pressure: 09-98. Hypertension stage 2  Systolic pressure: 338 or above.  Diastolic pressure: 90 or above. What health risks are associated with hypertension? Managing your hypertension is an important responsibility. Uncontrolled hypertension can lead to:  A heart attack.  A stroke.  A weakened blood vessel (aneurysm).  Heart failure.  Kidney damage.  Eye damage.  Metabolic syndrome.  Memory and concentration problems.  What changes can I make to manage my hypertension? Hypertension can be managed by making lifestyle changes and possibly by taking medicines. Your health care provider will help you make a plan to bring your blood pressure within a normal range. Eating and drinking  Eat a diet that is high in fiber and potassium, and low in salt (sodium), added sugar, and fat. An example eating plan is called the DASH (Dietary Approaches to Stop Hypertension) diet. To eat this way: ? Eat plenty of fresh fruits and vegetables. Try to fill half of your plate at each meal with fruits and vegetables. ? Eat whole grains, such as whole wheat pasta, brown rice, or whole grain bread. Fill about one quarter of your plate with whole grains. ? Eat low-fat diary products. ? Avoid fatty cuts of meat, processed or cured meats, and poultry with skin. Fill about one quarter of your plate with lean proteins such as fish, chicken without skin, beans, eggs, and tofu. ? Avoid premade  and processed foods. These tend to be higher in sodium, added sugar, and fat.  Reduce your daily sodium intake. Most people with hypertension should eat less than 1,500 mg of sodium a day.  Limit alcohol intake to no more than 1 drink a day for nonpregnant women and 2 drinks a day for men.  One drink equals 12 oz of beer, 5 oz of wine, or 1 oz of hard liquor. Lifestyle  Work with your health care provider to maintain a healthy body weight, or to lose weight. Ask what an ideal weight is for you.  Get at least 30 minutes of exercise that causes your heart to beat faster (aerobic exercise) most days of the week. Activities may include walking, swimming, or biking.  Include exercise to strengthen your muscles (resistance exercise), such as weight lifting, as part of your weekly exercise routine. Try to do these types of exercises for 30 minutes at least 3 days a week.  Do not use any products that contain nicotine or tobacco, such as cigarettes and e-cigarettes. If you need help quitting, ask your health care provider.  Control any long-term (chronic) conditions you have, such as high cholesterol or diabetes. Monitoring  Monitor your blood pressure at home as told by your health care provider. Your personal target blood pressure may vary depending on your medical conditions, your age, and other factors.  Have your blood pressure checked regularly, as often as told by your health care provider. Working with your health care provider  Review all the medicines you take with your health care provider because there may be side effects or interactions.  Talk with your health care provider about your diet, exercise habits, and other lifestyle factors that may be contributing to hypertension.  Visit your health care provider regularly. Your health care provider can help you create and adjust your plan for managing hypertension. Will I need medicine to control my blood pressure? Your health care provider may prescribe medicine if lifestyle changes are not enough to get your blood pressure under control, and if:  Your systolic blood pressure is 130 or higher.  Your diastolic blood pressure is 80 or higher.  Take medicines only as told by your health care provider. Follow the  directions carefully. Blood pressure medicines must be taken as prescribed. The medicine does not work as well when you skip doses. Skipping doses also puts you at risk for problems. Contact a health care provider if:  You think you are having a reaction to medicines you have taken.  You have repeated (recurrent) headaches.  You feel dizzy.  You have swelling in your ankles.  You have trouble with your vision. Get help right away if:  You develop a severe headache or confusion.  You have unusual weakness or numbness, or you feel faint.  You have severe pain in your chest or abdomen.  You vomit repeatedly.  You have trouble breathing. Summary  Hypertension is when the force of blood pumping through your arteries is too strong. If this condition is not controlled, it may put you at risk for serious complications.  Your personal target blood pressure may vary depending on your medical conditions, your age, and other factors. For most people, a normal blood pressure is less than 120/80.  Hypertension is managed by lifestyle changes, medicines, or both. Lifestyle changes include weight loss, eating a healthy, low-sodium diet, exercising more, and limiting alcohol. This information is not intended to replace advice given to you by your  health care provider. Make sure you discuss any questions you have with your health care provider. Document Released: 10/12/2011 Document Revised: 12/16/2015 Document Reviewed: 12/16/2015 Elsevier Interactive Patient Education  2018 [employer].   Cholesterol Cholesterol is a fat. Your body needs a small amount of cholesterol. Cholesterol (plaque) may build up in your blood vessels (arteries). That makes you more likely to have a heart attack or stroke. You cannot feel your cholesterol level. Having a blood test is the only way to find out if your level is high. Keep your test results. Work with your doctor to keep your cholesterol at a good  level. What do the results mean?  Total cholesterol is how much cholesterol is in your blood.  LDL is bad cholesterol. This is the type that can build up. Try to have low LDL.  HDL is good cholesterol. It cleans your blood vessels and carries LDL away. Try to have high HDL.  Triglycerides are fat that the body can store or burn for energy. What are good levels of cholesterol?  Total cholesterol below 200.  LDL below 100 is good for people who have health risks. LDL below 70 is good for people who have very high risks.  HDL above 40 is good. It is best to have HDL of 60 or higher.  Triglycerides below 150. How can I lower my cholesterol? Diet Follow your diet program as told by your doctor.  Choose fish, white meat chicken, or [place] that is roasted or baked. Try not to eat red meat, fried foods, sausage, or lunch meats.  Eat lots of fresh fruits and vegetables.  Choose whole grains, beans, pasta, potatoes, and cereals.  Choose olive oil, corn oil, or canola oil. Only use small amounts.  Try not to eat butter, mayonnaise, shortening, or palm kernel oils.  Try not to eat foods with trans fats.  Choose low-fat or nonfat dairy foods. ? Drink skim or nonfat milk. ? Eat low-fat or nonfat yogurt and cheeses. ? Try not to drink whole milk or cream. ? Try not to eat ice cream, egg yolks, or full-fat cheeses.  Healthy desserts include angel food cake, ginger snaps, animal crackers, hard candy, popsicles, and low-fat or nonfat frozen yogurt. Try not to eat pastries, cakes, pies, and cookies.  Exercise Follow your exercise program as told by your doctor.  Be more active. Try gardening, walking, and taking the stairs.  Ask your doctor about ways that you can be more active.  Medicine  Take over-the-counter and prescription medicines only as told by your doctor. This information is not intended to replace advice given to you by your health care provider. Make sure you discuss  any questions you have with your health care provider. Document Released: 04/15/2008 Document Revised: 08/19/2015 Document Reviewed: 07/30/2015 Elsevier Interactive Patient Education  2018 [employer].   IF you received an x-ray today, you will receive an invoice from [facility]. Please contact [facility] at [phone] with questions or concerns regarding your invoice.   IF you received labwork today, you will receive an invoice from [facility]. Please contact [facility] at [phone] with questions or concerns regarding your invoice.   Our billing staff will not be able to assist you with questions regarding bills from these companies.  You will be contacted with the lab results as soon as they are available. The fastest way to get your results is to activate your My Chart account. Instructions are located on the last page of this  paperwork. If you have not heard from [place] regarding the results in 2 weeks, please contact this office.

## 2017-06-03 NOTE — Progress Notes (Signed)
MRN: [id number] DOB: Aug 11, 1950  Subjective:   Erin Benitez is a 67 y.o. female with PMH of HTN, HLD, GERD, diverticulosis, and chronic abdominal pain presenting for medication refill. Pt is fasting today. Has been out of all medications for about one year. Takes amlodipine 10 mg for HTN and rosuvastatin 10 mg for HLD.  She will occasionally check her blood pressure outside the office and is typically running in the 409W systolically. Reports intermittent sharp retrosternal exertional chest pain over past few months, not present today. Pain will last for a few hours. Relieved with rest. Has associated SOB. Denies diaphoresis, lightheadedness, dizziness, chronic headache, double vision, palpitations, nausea, vomiting, hematuria, and  lower leg swelling. 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 Has balanced diet: chicken, [place], seafood, minimal red meat, vegetables, and fruits. Her downfall includes snacks, soda, and sweets. Denies smoking and alcohol. Has PMH of heart murmur, used to see cardiology but has not seen them in at least 2 years. Has FH of MI, HLD, and stroke in mother. Last colonoscpy 05/09/2014.   Review of Systems  Constitutional: Negative for weight loss.  Eyes: Negative for blurred vision, double vision and discharge.  Gastrointestinal: Positive for abdominal pain (intermittent epigastric pain x years, worse when she is hungry, better after she eats, no N/V/D, not present today, has been evaluated mulitple times in past in office and by GI, takes nexium with relief ). Negative for constipation and diarrhea.  Neurological: Negative for weakness.    Derrika has a current medication list which includes the following prescription(s): omeprazole, amlodipine, cyclobenzaprine, esomeprazole, gabapentin, hydrocodone-acetaminophen, and rosuvastatin. Also is allergic to peanut-containing drug products and tramadol.  Malarie  has a past medical history of Allergy, Asthma, Bronchitis,  Cataract, GERD (gastroesophageal reflux disease), Heart murmur, and Hypertension. Also  has a past surgical history that includes Cesarean section; Tonsillectomy; and Colonoscopy.    Social History   Socioeconomic History  . Marital status: Married    Spouse name: Not on file  . Number of children: 1  . Years of education: Not on file  . Highest education level: Not on file  Occupational History  . Occupation: Tourist information centre manager  Social Needs  . Financial resource strain: Not on file  . Food insecurity:    Worry: Not on file    Inability: Not on file  . Transportation needs:    Medical: Not on file    Non-medical: Not on file  Tobacco Use  . Smoking status: Never Smoker  . Smokeless tobacco: Never Used  Substance and Sexual Activity  . Alcohol use: No  . Drug use: No  . Sexual activity: Not Currently  Lifestyle  . Physical activity:    Days per week: Not on file    Minutes per session: Not on file  . Stress: Not on file  Relationships  . Social connections:    Talks on phone: Not on file    Gets together: Not on file    Attends religious service: Not on file    Active member of club or organization: Not on file    Attends meetings of clubs or organizations: Not on file    Relationship status: Not on file  . Intimate partner violence:    Fear of current or ex partner: Not on file    Emotionally abused: Not on file    Physically abused: Not on file    Forced sexual activity: Not on file  Other Topics Concern  . Not  on file  Social History Narrative   She reports she is single. She has 1 daughter. She is an Tourist information centre manager. 2 caffeinated beverages daily.    Objective:   Vitals: BP (!) 180/100 (BP Location: Left Arm, Cuff Size: Normal)   Pulse 60   Temp 98.5 F (36.9 C) (Oral)   Resp 16   Ht 5' 3.5" (1.613 m)   Wt 142 lb (64.4 kg)   SpO2 97%   BMI 24.76 kg/m   Physical Exam  Constitutional: She is oriented to person, place, and time. She appears well-developed and  well-nourished. No distress.  HENT:  Head: Normocephalic and atraumatic.  Mouth/Throat: Uvula is midline, oropharynx is clear and moist and mucous membranes are normal.  Eyes: Pupils are equal, round, and reactive to light. Conjunctivae and EOM are normal.  Neck: Normal range of motion.  Cardiovascular: Normal rate, regular rhythm and intact distal pulses.  Murmur (LUSB) heard.  Systolic murmur is present with a grade of 2/6. Pulmonary/Chest: Effort normal and breath sounds normal. She has no wheezes. She has no rhonchi. She has no rales.  Abdominal: Soft. Normal appearance and bowel sounds are normal. She exhibits no distension. There is no tenderness. There is no rigidity, no rebound, no guarding, no CVA tenderness, no tenderness at McBurney's point and negative Murphy's sign.  Musculoskeletal:       Right lower leg: She exhibits no swelling.       Left lower leg: She exhibits no swelling.  Neurological: She is alert and oriented to person, place, and time.  Skin: Skin is warm and dry.  Psychiatric: She has a normal mood and affect.  Vitals reviewed.   No results found for this or any previous visit (from the past 24 hour(s)).  BP Readings from Last 3 Encounters:  06/03/17 (!) 180/100  03/20/16 (!) 189/103  03/23/15 (!) 138/92   Wt Readings from Last 3 Encounters:  06/03/17 142 lb (64.4 kg)  03/20/16 145 lb (65.8 kg)  03/23/15 148 lb 6.4 oz (67.3 kg)    EKG shows sins bradycardia with rate of 58 bpm. PR and QRS intervals within normal limits. No acute changes noted from prior EKG of 2017. Findings presented and discussed with Dr. Brigitte Pulse.   Assessment and Plan :  1. Hypertensive emergency BP severely uncontrolled at this time.  Patient is asymptomatic today.  EKG with no acute changes noted from EKG in 2017. Labs pending.  Educated patient on potential life-threatening complications of blood pressure values this elevated.  Recommend restarting blood pressure medication today.  Educated on lifestyle modifications.  Pt has reported hx of intermittent chest pain. Description not consistent with typical angina. However, due to hx and risk factors, will refer to cards for further evaluation and stress test. Plan to f/u here in 2 days for reevaluation. Given strict ED precautions.  - EKG 12-Lead - CBC with Differential/Platelet - CMP14+EGFR - Lipid panel - TSH - POCT urinalysis dipstick - amLODipine (NORVASC) 10 MG tablet; Take 0.5 tablets (5 mg total) by mouth daily.  Dispense: 30 tablet; Refill: 0 - hydrochlorothiazide (MICROZIDE) 12.5 MG capsule; Take 1 capsule (12.5 mg total) by mouth daily.  Dispense: 30 capsule; Refill: 0 - Blood Pressure Monitor KIT; 1 Units by Does not apply route daily.  Dispense: 1 each; Refill: 0 - Ambulatory referral to Cardiology  2. Heart murmur - Ambulatory referral to Cardiology  3. H/O exertional chest pain - Ambulatory referral to Cardiology  4. Mixed hyperlipidemia - rosuvastatin (CRESTOR)  10 MG tablet; Take 1 tablet (10 mg total) by mouth daily.  Dispense: 90 tablet; Refill: 3  5. Elevated glucose - Hemoglobin A1c  6. Abdominal pain, chronic, epigastric Pt has had chronic epigastric pain for years. Not present today. Will order basic labs. Plan for H.pylori test when she returns. - CMP14+EGFR - Lipase  7. Sedentary lifestyle  Tenna Delaine, PA-C  [facility] [place] 06/03/2017 11:06 AM

## 2017-06-04 ENCOUNTER — Encounter: Payer: Self-pay | Admitting: Physician Assistant

## 2017-06-04 LAB — CBC WITH DIFFERENTIAL/PLATELET
Basophils Absolute: 0 10*3/uL (ref 0.0–0.2)
Basos: 0 %
EOS (ABSOLUTE): 0.1 10*3/uL (ref 0.0–0.4)
EOS: 2 %
HEMATOCRIT: 39.6 % (ref 34.0–46.6)
HEMOGLOBIN: 13.3 g/dL (ref 11.1–15.9)
IMMATURE GRANULOCYTES: 0 %
Immature Grans (Abs): 0 10*3/uL (ref 0.0–0.1)
LYMPHS ABS: 1.9 10*3/uL (ref 0.7–3.1)
Lymphs: 46 %
MCH: 28.5 pg (ref 26.6–33.0)
MCHC: 33.6 g/dL (ref 31.5–35.7)
MCV: 85 fL (ref 79–97)
MONOCYTES: 9 %
Monocytes Absolute: 0.3 10*3/uL (ref 0.1–0.9)
NEUTROS PCT: 43 %
Neutrophils Absolute: 1.7 10*3/uL (ref 1.4–7.0)
Platelets: 271 10*3/uL (ref 150–379)
RBC: 4.66 x10E6/uL (ref 3.77–5.28)
RDW: 13.9 % (ref 12.3–15.4)
WBC: 4 10*3/uL (ref 3.4–10.8)

## 2017-06-04 LAB — CMP14+EGFR
ALBUMIN: 4.7 g/dL (ref 3.6–4.8)
ALT: 17 IU/L (ref 0–32)
AST: 17 IU/L (ref 0–40)
Albumin/Globulin Ratio: 1.7 (ref 1.2–2.2)
Alkaline Phosphatase: 115 IU/L (ref 39–117)
BUN / CREAT RATIO: 19 (ref 12–28)
BUN: 13 mg/dL (ref 8–27)
Bilirubin Total: 0.6 mg/dL (ref 0.0–1.2)
CALCIUM: 10 mg/dL (ref 8.7–10.3)
CHLORIDE: 101 mmol/L (ref 96–106)
CO2: 26 mmol/L (ref 20–29)
Creatinine, Ser: 0.68 mg/dL (ref 0.57–1.00)
GFR calc non Af Amer: 91 mL/min/{1.73_m2} (ref >59)
GFR, EST AFRICAN AMERICAN: 105 mL/min/{1.73_m2} (ref >59)
GLOBULIN, TOTAL: 2.8 g/dL (ref 1.5–4.5)
Glucose: 168 mg/dL — ABNORMAL HIGH (ref 65–99)
POTASSIUM: 4.6 mmol/L (ref 3.5–5.2)
SODIUM: 142 mmol/L (ref 134–144)
TOTAL PROTEIN: 7.5 g/dL (ref 6.0–8.5)

## 2017-06-04 LAB — HEMOGLOBIN A1C
Est. average glucose Bld gHb Est-mCnc: 203 mg/dL
HEMOGLOBIN A1C: 8.7 % — AB (ref 4.8–5.6)

## 2017-06-04 LAB — LIPID PANEL
CHOLESTEROL TOTAL: 296 mg/dL — AB (ref 100–199)
Chol/HDL Ratio: 6 ratio — ABNORMAL HIGH (ref 0.0–4.4)
HDL: 49 mg/dL (ref >39)
LDL Calculated: 218 mg/dL — ABNORMAL HIGH (ref 0–99)
Triglycerides: 146 mg/dL (ref 0–149)
VLDL CHOLESTEROL CAL: 29 mg/dL (ref 5–40)

## 2017-06-04 LAB — LIPASE: LIPASE: 17 U/L (ref 14–72)

## 2017-06-04 LAB — TSH: TSH: 1.08 u[IU]/mL (ref 0.450–4.500)

## 2017-06-05 ENCOUNTER — Ambulatory Visit: Payer: Medicare Other | Admitting: Physician Assistant

## 2017-06-13 ENCOUNTER — Other Ambulatory Visit: Payer: Self-pay | Admitting: Physician Assistant

## 2017-06-13 MED ORDER — METFORMIN HCL 1000 MG PO TABS: 1000.0000 mg | ORAL_TABLET | Freq: Two times a day (BID) | ORAL | 0 refills | Status: DC

## 2017-06-13 NOTE — Progress Notes (Signed)
Meds ordered this encounter  Medications  . metFORMIN (GLUCOPHAGE) 1000 MG tablet    Sig: Take 1 tablet (1,000 mg total) by mouth 2 (two) times daily with a meal.    Dispense:  60 tablet    Refill:  0    Order Specific Question:   Supervising Provider    Answer:   Wardell Honour [[id number]]

## 2017-06-24 ENCOUNTER — Ambulatory Visit: Payer: Medicare Other | Admitting: Physician Assistant

## 2017-06-29 DIAGNOSIS — E782 Mixed hyperlipidemia: Secondary | ICD-10-CM | POA: Diagnosis not present

## 2017-06-29 DIAGNOSIS — I10 Essential (primary) hypertension: Secondary | ICD-10-CM | POA: Diagnosis not present

## 2017-06-29 DIAGNOSIS — Z0189 Encounter for other specified special examinations: Secondary | ICD-10-CM | POA: Diagnosis not present

## 2017-06-29 DIAGNOSIS — R0609 Other forms of dyspnea: Secondary | ICD-10-CM | POA: Diagnosis not present

## 2017-07-08 ENCOUNTER — Other Ambulatory Visit: Payer: Self-pay

## 2017-07-08 ENCOUNTER — Ambulatory Visit (INDEPENDENT_AMBULATORY_CARE_PROVIDER_SITE_OTHER): Payer: Medicare Other | Admitting: Physician Assistant

## 2017-07-08 ENCOUNTER — Encounter: Payer: Self-pay | Admitting: Physician Assistant

## 2017-07-08 VITALS — BP 152/82 | HR 60 | Temp 98.3°F | Resp 18 | Ht 63.78 in | Wt 142.2 lb

## 2017-07-08 DIAGNOSIS — Z1239 Encounter for other screening for malignant neoplasm of breast: Secondary | ICD-10-CM

## 2017-07-08 DIAGNOSIS — Z13820 Encounter for screening for osteoporosis: Secondary | ICD-10-CM | POA: Diagnosis not present

## 2017-07-08 DIAGNOSIS — I10 Essential (primary) hypertension: Secondary | ICD-10-CM | POA: Diagnosis not present

## 2017-07-08 DIAGNOSIS — Z23 Encounter for immunization: Secondary | ICD-10-CM | POA: Diagnosis not present

## 2017-07-08 DIAGNOSIS — E119 Type 2 diabetes mellitus without complications: Secondary | ICD-10-CM | POA: Diagnosis not present

## 2017-07-08 DIAGNOSIS — Z1231 Encounter for screening mammogram for malignant neoplasm of breast: Secondary | ICD-10-CM

## 2017-07-08 DIAGNOSIS — E2839 Other primary ovarian failure: Secondary | ICD-10-CM

## 2017-07-08 LAB — POCT URINALYSIS DIP (MANUAL ENTRY)
Bilirubin, UA: NEGATIVE
Glucose, UA: NEGATIVE mg/dL
Ketones, POC UA: NEGATIVE mg/dL
Leukocytes, UA: NEGATIVE
Nitrite, UA: NEGATIVE
Protein Ur, POC: NEGATIVE mg/dL
RBC UA: NEGATIVE
SPEC GRAV UA: 1.02 (ref 1.010–1.025)
UROBILINOGEN UA: 0.2 U/dL
pH, UA: 7 (ref 5.0–8.0)

## 2017-07-08 LAB — GLUCOSE, POCT (MANUAL RESULT ENTRY): POC Glucose: 161 mg/dl — AB (ref 70–99)

## 2017-07-08 MED ORDER — ZOSTER VAC RECOMB ADJUVANTED 50 MCG/0.5ML IM SUSR: 0.5000 mL | Freq: Once | INTRAMUSCULAR | 0 refills | Status: AC

## 2017-07-08 MED ORDER — AMLODIPINE BESYLATE 10 MG PO TABS
5.0000 mg | ORAL_TABLET | Freq: Every day | ORAL | 0 refills | Status: DC
Start: 2017-07-08 — End: 2017-09-30

## 2017-07-08 MED ORDER — METFORMIN HCL ER 500 MG PO TB24: 2000.0000 mg | ORAL_TABLET | Freq: Every day | ORAL | 0 refills | Status: DC

## 2017-07-08 MED ORDER — BLOOD GLUCOSE MONITOR KIT: PACK | 0 refills | Status: DC

## 2017-07-08 MED ORDER — LISINOPRIL 10 MG PO TABS: 10.0000 mg | ORAL_TABLET | Freq: Every day | ORAL | 0 refills | Status: DC

## 2017-07-08 NOTE — Progress Notes (Signed)
MRN: [id number] DOB: January 31, 1951  Subjective:   Erin Benitez is a 67 y.o. female presenting for follow up on chronic medical conditions.  HTN: Currently managed with amlodipine 57m. Stopped HCTZ because she was having some eye issues after takin that resolved after she stopped. Patient is checking blood pressure at home, range is 1350-093Gsystolic. Denies lightheadedness, dizziness, chronic headache, double vision, chest pain, shortness of breath, heart racing, palpitations, nausea, vomiting, abdominal pain, hematuria, lower leg swelling. Had visit with cardiology. Had echo, which she says is normal. Has stress test planned for 07/21/17.  T2DM: New diagnosis was made after last visit. A1C was 8.7.  Patient is currently managed with metformin 10068mBID. Admits good compliance. She is having diarrhea. Denies metallic taste, hypoglycemia, nausea, and vomiting. Patient is not checking home blood sugars. Notes that prior to starting metformin she was having polyuria, but that has since resolved.  Patient denies foot ulcerations, nausea, paresthesia of the feet, polydipsia, polyuria, visual disturbances, vomiting and weight loss. Patient is checking their feet daily. No foot concerns. Last diabetic eye exam eye exam 12/2016. No retinopathy. Diet: Has changed a lot since last visit. Changed from canned food to fresh food, stopped eating on desserts. Started drinking water. Exercise includes walking 30 minutes at least 5 days a week. Denies smoking or alcohol use.   Known diabetic complications: none  Immunizations: Flu vaccine: 03/2015 , shingles: never, pneumococal vaccine: never  ROS per HPI  Erin Benitez has a current medication list which includes the following prescription(s): amlodipine, blood pressure monitor, esomeprazole, rosuvastatin, blood glucose meter kit and supplies, lisinopril, and metformin. Also is allergic to peanut-containing drug products and tramadol.  [place] a past medical history of  Allergy, Asthma, Bronchitis, Cataract, GERD (gastroesophageal reflux disease), Heart murmur, and Hypertension. Also  has a past surgical history that includes Cesarean section; Tonsillectomy; and Colonoscopy.   Objective:   Vitals: BP (!) 152/82   Pulse 60   Temp 98.3 F (36.8 C) (Oral)   Resp 18   Ht 5' 3.78" (1.62 m)   Wt 142 lb 3.2 oz (64.5 kg)   SpO2 98%   BMI 24.58 kg/m   Physical Exam  Constitutional: She is oriented to person, place, and time. She appears well-developed and well-nourished. No distress.  HENT:  Head: Normocephalic and atraumatic.  Mouth/Throat: Uvula is midline, oropharynx is clear and moist and mucous membranes are normal.  Eyes: Pupils are equal, round, and reactive to light. Conjunctivae and EOM are normal.  Neck: Normal range of motion.  Cardiovascular: Normal rate, regular rhythm and intact distal pulses.  Murmur (LUSB) heard.  Systolic murmur is present. Pulmonary/Chest: Effort normal and breath sounds normal. She has no wheezes. She has no rhonchi. She has no rales.  Musculoskeletal:       Right lower leg: She exhibits no swelling.       Left lower leg: She exhibits no swelling.  Neurological: She is alert and oriented to person, place, and time.  Skin: Skin is warm.  Psychiatric: She has a normal mood and affect.  Vitals reviewed.   No results found for this or any previous visit (from the past 24 hour(s)). Wt Readings from Last 3 Encounters:  07/08/17 142 lb 3.2 oz (64.5 kg)  06/03/17 142 lb (64.4 kg)  03/20/16 145 lb (65.8 kg)    Assessment and Plan :  1. Essential hypertension Uncontrolled. Rec adding lisinopril. Check bp outside office. Goal is <140/90 and >100/60. F/u  in 3 months.  - lisinopril (PRINIVIL,ZESTRIL) 10 MG tablet; Take 1 tablet (10 mg total) by mouth daily. Dispense: 90 tablet; Refill: 0 - amLODipine (NORVASC) 10 MG tablet; Take 0.5 tablets (5 mg total) by mouth daily.  Dispense: 90 tablet; Refill: 0  2. Type 2 diabetes  mellitus without complication, without long-term current use of insulin ([place]) Glucosuria resolved. POC glucose 161. Pt congratulated on lifestyle modifications. She is hesitant to be on metformin due to what she has read about it. Had long discussion about different oral options. She agrees to try XR metformin to see if this helps with diarrhea. DECLINES diabetic nutrition referral. Encouraged to continue lifestyle modifications, medications, and start checking glucose at home. Plan to f/u in 3 months.  - POCT urinalysis dipstick - POCT glucose (manual entry) - Microalbumin/Creatinine Ratio, Urine - HM Diabetes Foot Exam - Pneumococcal conjugate vaccine 13-valent IM - Zoster Vaccine Adjuvanted [facility]) injection; Inject 0.5 mLs into the muscle once for 1 dose.  Dispense: 0.5 mL; Refill: 0 - metFORMIN (GLUCOPHAGE-XR) 500 MG 24 hr tablet; Take 4 tablets (2,000 mg total) by mouth daily with breakfast.  Dispense: 360 tablet; Refill: 0 - blood glucose meter kit and supplies KIT; Dispense based on patient and insurance preference. Use up to four times daily as directed. (FOR ICD-9 250.00, 250.01).  Dispense: 1 each; Refill: 0  3. Screening for breast cancer - MM Digital Screening; Future  4. Screening for osteoporosis - DG Bone Density; Future  5. Estrogen deficiency - DG Bone Density; Future  Side effects, risks, benefits, and alternatives of the medications and treatment plan prescribed today were discussed, and patient expressed understanding of the instructions given. No barriers to understanding were identified. Red flags discussed in detail. Pt expressed understanding regarding what to do in case of emergency/urgent symptoms.  Tenna Delaine, PA-C  [facility] 07/09/2017 11:08 AM

## 2017-07-08 NOTE — Patient Instructions (Addendum)
Start taking metformin XR as prescribed for diabetes. Start checking your blood sugars daily or every other day with the glucometer I sent in. Check your sugars when you are fastin gin the morning before a meal. Your goal is 90-110. Keep working on diet and exercise.   For high blood pressure, start taking lisinopril 10mg  with amlodipine. Continue checking bp. Goal is <140/80 and >100/60.  For dexa and mammogram, you should get a phone call in about 2 weeks to schedule this.   Follow up in 3 months.    Type 2 Diabetes Mellitus, Diagnosis, Adult Type 2 diabetes (type 2 diabetes mellitus) is a long-term (chronic) disease. It may be caused by one or both of these problems:  Your body does not make enough of a hormone called insulin.  Your body does not react in a normal way to insulin that it makes.  Insulin lets sugars (glucose) go into cells in the body. This gives you energy. If you have type 2 diabetes, sugars cannot get into cells. This causes high blood sugar (hyperglycemia). Your doctor will set treatment goals for you. Generally, you should have these blood sugar levels:  Before meals (preprandial): 80-130 mg/dL (4.4-7.2 mmol/L).  After meals (postprandial): below 180 mg/dL (10 mmol/L).  A1c (hemoglobin A1c) level: less than 7%.  Follow these instructions at home: Questions to Ask Your Doctor  You may want to ask these questions:  Do I need to meet with a diabetes educator?  Where can I find a support group for people with diabetes?  What equipment will I need to care for myself at home?  What diabetes medicines do I need? When should I take them?  How often do I need to check my blood sugar?  What number can I call if I have questions?  When is my next doctor's visit?  General instructions  Take over-the-counter and prescription medicines only as told by your doctor.  Keep all follow-up visits as told by your doctor. This is important. Contact a doctor  if:  Your blood sugar is at or above 240 mg/dL (13.3 mmol/L) for 2 days in a row.  You have been sick or have had a fever for 2 days or more and you are not getting better.  You have any of these problems for more than 6 hours: ? You cannot eat or drink. ? You feel sick to your stomach (nauseous). ? You throw up (vomit). ? You have watery poop (diarrhea). Get help right away if:  Your blood sugar is lower than 54 mg/dL (3 mmol/L).  You get confused.  You have trouble: ? Thinking clearly. ? Breathing.  You have moderate or large ketone levels in your pee (urine). This information is not intended to replace advice given to you by your health care provider. Make sure you discuss any questions you have with your health care provider. Document Released: 10/27/2007 Document Revised: 06/25/2015 Document Reviewed: 02/20/2015 Elsevier Interactive Patient Education  2018 [place] protect organs, store calcium, and anchor muscles. Good health habits, such as eating nutritious foods and exercising regularly, are important for maintaining healthy bones. They can also help to prevent a condition that causes bones to lose density and become weak and brittle (osteoporosis). Why is bone mass important? Bone mass refers to the amount of bone tissue that you have. The higher your bone mass, the stronger your bones. An important step toward having healthy bones throughout life is to  have strong and dense bones during childhood. A young adult who has a high bone mass is more likely to have a high bone mass later in life. Bone mass at its greatest it is called peak bone mass. A large decline in bone mass occurs in older adults. In women, it occurs about the time of menopause. During this time, it is important to practice good health habits, because if more bone is lost than what is replaced, the bones will become less healthy and more likely to break (fracture). If you find that  you have a low bone mass, you may be able to prevent osteoporosis or further bone loss by changing your diet and lifestyle. How can I find out if my bone mass is low? Bone mass can be measured with an X-ray test that is called a bone mineral density (BMD) test. This test is recommended for all women who are age 36 or older. It may also be recommended for men who are age 25 or older, or for people who are more likely to develop osteoporosis due to:  Having bones that break easily.  Having a long-term disease that weakens bones, such as kidney disease or rheumatoid arthritis.  Having menopause earlier than normal.  Taking medicine that weakens bones, such as steroids, thyroid hormones, or hormone treatment for breast cancer or prostate cancer.  Smoking.  Drinking three or more alcoholic drinks each day.  What are the nutritional recommendations for healthy bones? To have healthy bones, you need to get enough of the right minerals and vitamins. Most nutrition experts recommend getting these nutrients from the foods that you eat. Nutritional recommendations vary from person to person. Ask your health care provider what is healthy for you. Here are some general guidelines. Calcium Recommendations Calcium is the most important (essential) mineral for bone health. Most people can get enough calcium from their diet, but supplements may be recommended for people who are at risk for osteoporosis. Good sources of calcium include:  Dairy products, such as low-fat or nonfat milk, cheese, and yogurt.  Dark green leafy vegetables, such as bok choy and broccoli.  Calcium-fortified foods, such as orange juice, cereal, bread, soy beverages, and tofu products.  Nuts, such as almonds.  Follow these recommended amounts for daily calcium intake:  Children, age 81?3: 700 mg.  Children, age 67?8: 1,000 mg.  Children, age 34?13: 1,300 mg.  Teens, age 33?18: 1,300 mg.  Adults, age 5?50: 1,000  mg.  Adults, age 20?70: ? Men: 1,000 mg. ? Women: 1,200 mg.  Adults, age 35 or older: 1,200 mg.  Pregnant and breastfeeding females: ? Teens: 1,300 mg. ? Adults: 1,000 mg.  Vitamin D Recommendations Vitamin D is the most essential vitamin for bone health. It helps the body to absorb calcium. Sunlight stimulates the skin to make vitamin D, so be sure to get enough sunlight. If you live in a cold climate or you do not get outside often, your health care provider may recommend that you take vitamin D supplements. Good sources of vitamin D in your diet include:  Egg yolks.  Saltwater fish.  Milk and cereal fortified with vitamin D.  Follow these recommended amounts for daily vitamin D intake:  Children and teens, age 74?18: 1 international units.  Adults, age 36 or younger: 400-800 international units.  Adults, age 59 or older: 800-1,000 international units.  Other Nutrients Other nutrients for bone health include:  Phosphorus. This mineral is found in meat, poultry, dairy foods, nuts,  and legumes. The recommended daily intake for adult men and adult women is 700 mg.  Magnesium. This mineral is found in seeds, nuts, dark green vegetables, and legumes. The recommended daily intake for adult men is 400?420 mg. For adult women, it is 310?320 mg.  Vitamin K. This vitamin is found in green leafy vegetables. The recommended daily intake is 120 mg for adult men and 90 mg for adult women.  What type of physical activity is best for building and maintaining healthy bones? Weight-bearing and strength-building activities are important for building and maintaining peak bone mass. Weight-bearing activities cause muscles and bones to work against gravity. Strength-building activities increases muscle strength that supports bones. Weight-bearing and muscle-building activities include:  Walking and hiking.  Jogging and running.  Dancing.  Gym exercises.  Lifting weights.  Tennis and  racquetball.  Climbing stairs.  Aerobics.  Adults should get at least 30 minutes of moderate physical activity on most days. Children should get at least 60 minutes of moderate physical activity on most days. Ask your health care provide what type of exercise is best for you. Where can I find more information? For more information, check out the following websites:  [place]: YardHomes.se  [employer] of Health: http://www.niams.AnonymousEar.fr.asp  This information is not intended to replace advice given to you by your health care provider. Make sure you discuss any questions you have with your health care provider. Document Released: 04/09/2003 Document Revised: 08/07/2015 Document Reviewed: 01/22/2014 Elsevier Interactive Patient Education  2018 [employer].    IF you received an x-ray today, you will receive an invoice from [facility] Radiology. Please contact [facility] Radiology at [phone] with questions or concerns regarding your invoice.   IF you received labwork today, you will receive an invoice from [place]. Please contact [facility] at [phone] with questions or concerns regarding your invoice.   Our billing staff will not be able to assist you with questions regarding bills from these companies.  You will be contacted with the lab results as soon as they are available. The fastest way to get your results is to activate your My Chart account. Instructions are located on the last page of this paperwork. If you have not heard from [place] regarding the results in 2 weeks, please contact this office.

## 2017-07-09 LAB — MICROALBUMIN / CREATININE URINE RATIO
Creatinine, Urine: 118.1 mg/dL
MICROALB/CREAT RATIO: 9.7 mg/g{creat} (ref 0.0–30.0)
MICROALBUM., U, RANDOM: 11.4 ug/mL

## 2017-07-26 ENCOUNTER — Encounter (HOSPITAL_COMMUNITY): Payer: Self-pay | Admitting: Emergency Medicine

## 2017-07-26 ENCOUNTER — Emergency Department (HOSPITAL_COMMUNITY)
Admission: EM | Admit: 2017-07-26 | Discharge: 2017-07-26 | Disposition: A | Discharge status: Home / Self Care | Payer: Medicare Other | Attending: Emergency Medicine | Admitting: Emergency Medicine

## 2017-07-26 DIAGNOSIS — T465X5A Adverse effect of other antihypertensive drugs, initial encounter: Secondary | ICD-10-CM | POA: Diagnosis not present

## 2017-07-26 DIAGNOSIS — Z79899 Other long term (current) drug therapy: Secondary | ICD-10-CM | POA: Diagnosis not present

## 2017-07-26 DIAGNOSIS — T783XXA Angioneurotic edema, initial encounter: Secondary | ICD-10-CM | POA: Diagnosis not present

## 2017-07-26 DIAGNOSIS — Z91010 Allergy to peanuts: Secondary | ICD-10-CM | POA: Insufficient documentation

## 2017-07-26 DIAGNOSIS — Y69 Unspecified misadventure during surgical and medical care: Secondary | ICD-10-CM | POA: Diagnosis not present

## 2017-07-26 DIAGNOSIS — J45909 Unspecified asthma, uncomplicated: Secondary | ICD-10-CM | POA: Diagnosis not present

## 2017-07-26 DIAGNOSIS — I10 Essential (primary) hypertension: Secondary | ICD-10-CM | POA: Insufficient documentation

## 2017-07-26 DIAGNOSIS — Z7984 Long term (current) use of oral hypoglycemic drugs: Secondary | ICD-10-CM | POA: Insufficient documentation

## 2017-07-26 DIAGNOSIS — R600 Localized edema: Secondary | ICD-10-CM | POA: Diagnosis present

## 2017-07-26 DIAGNOSIS — T887XXA Unspecified adverse effect of drug or medicament, initial encounter: Secondary | ICD-10-CM | POA: Insufficient documentation

## 2017-07-26 DIAGNOSIS — T464X5A Adverse effect of angiotensin-converting-enzyme inhibitors, initial encounter: Secondary | ICD-10-CM

## 2017-07-26 MED ORDER — FAMOTIDINE 20 MG PO TABS: 20.0000 mg | ORAL_TABLET | Freq: Two times a day (BID) | ORAL | 0 refills | Status: DC

## 2017-07-26 MED ORDER — DIPHENHYDRAMINE HCL 50 MG/ML IJ SOLN: 25.0000 mg | Freq: Once | INTRAMUSCULAR | Status: DC

## 2017-07-26 MED ORDER — PREDNISONE 20 MG PO TABS: ORAL_TABLET | ORAL | 0 refills | Status: DC

## 2017-07-26 MED ORDER — LORATADINE 10 MG PO TABS
10.0000 mg | ORAL_TABLET | Freq: Once | ORAL | Status: AC
  Administered 2017-07-26: 10 mg via ORAL
  Filled 2017-07-26: qty 1

## 2017-07-26 MED ORDER — FAMOTIDINE IN NACL 20-0.9 MG/50ML-% IV SOLN
20.0000 mg | Freq: Once | INTRAVENOUS | Status: AC
  Administered 2017-07-26: 20 mg via INTRAVENOUS
  Filled 2017-07-26: qty 50

## 2017-07-26 MED ORDER — METHYLPREDNISOLONE SODIUM SUCC 125 MG IJ SOLR
125.0000 mg | Freq: Once | INTRAMUSCULAR | Status: AC
  Administered 2017-07-26: 125 mg via INTRAVENOUS
  Filled 2017-07-26: qty 2

## 2017-07-26 NOTE — ED Provider Notes (Signed)
[facility] DEPT Provider Note   CSN: [id number] Arrival date & time: 07/26/17  0501     History   Chief Complaint No chief complaint on file.   HPI Erin Benitez is a 67 y.o. female.  The history is provided by the patient.  Allergic Reaction  Presenting symptoms: swelling   Presenting symptoms: no difficulty swallowing and no wheezing   Presenting symptoms comment:  Lower lip R>L Severity:  Mild Prior allergic episodes:  Food/nut allergies Context: medications   Context comment:  Lisinopril started 4 days ago Relieved by:  Nothing Worsened by:  Nothing Ineffective treatments:  None tried   Past Medical History:  Diagnosis Date  . Allergy   . Asthma   . Bronchitis   . Cataract   . GERD (gastroesophageal reflux disease)   . Heart murmur   . Hypertension     Patient Active Problem List   Diagnosis Date Noted  . Diverticulosis of colon without hemorrhage 03/09/2015  . Atypical chest pain 03/09/2015  . Hyperlipidemia 03/09/2015  . Gastroesophageal reflux disease without esophagitis 03/09/2015  . HTN (hypertension) 04/01/2013  . Asthma 04/01/2013    Past Surgical History:  Procedure Laterality Date  . CESAREAN SECTION    . COLONOSCOPY    . TONSILLECTOMY       OB History   None      Home Medications    Prior to Admission medications   Medication Sig Start Date End Date Taking? Authorizing Provider  amLODipine (NORVASC) 10 MG tablet Take 0.5 tablets (5 mg total) by mouth daily. 07/08/17   Tenna Delaine D, PA-C  blood glucose meter kit and supplies KIT Dispense based on patient and insurance preference. Use up to four times daily as directed. (FOR ICD-9 250.00, 250.01). 07/08/17   Tenna Delaine D, PA-C  Blood Pressure Monitor KIT 1 Units by Does not apply route daily. 06/03/17   Tenna Delaine D, PA-C  esomeprazole (NEXIUM) 20 MG capsule Take 40 mg by mouth daily at 12 noon. Reported on 03/07/2015    [provider]  lisinopril (PRINIVIL,ZESTRIL) 10 MG tablet Take 1 tablet (10 mg total) by mouth daily. 07/08/17   Tenna Delaine D, PA-C  metFORMIN (GLUCOPHAGE-XR) 500 MG 24 hr tablet Take 4 tablets (2,000 mg total) by mouth daily with breakfast. 07/08/17   Tenna Delaine D, PA-C  rosuvastatin (CRESTOR) 10 MG tablet Take 1 tablet (10 mg total) by mouth daily. 06/03/17   Leonie Douglas, PA-C    Family History Family History  Problem Relation Age of Onset  . Cancer Mother        ovarian cancer  . Heart disease Mother   . Liver disease Mother   . Hyperlipidemia Mother   . Stroke Mother   . Cancer Father        lung cancer  . Cancer Sister   . Hyperlipidemia Sister   . Cancer Sister   . Hyperlipidemia Sister   . Diabetes Maternal Aunt   . Colon cancer Maternal Aunt        x2 aunts  . Colon cancer Maternal Uncle        2 uncles  . Colon cancer Paternal Grandfather   . Esophageal cancer Neg Hx   . Rectal cancer Neg Hx   . Stomach cancer Neg Hx     Social History Social History   Tobacco Use  . Smoking status: Never Smoker  . Smokeless tobacco: Never Used  Substance Use Topics  .  Alcohol use: No  . Drug use: No     Allergies   Peanut-containing drug products and Tramadol   Review of Systems Review of Systems  Constitutional: Negative for diaphoresis and fever.  HENT: Positive for facial swelling. Negative for congestion, dental problem, sore throat, trouble swallowing and voice change.   Eyes: Negative for photophobia and visual disturbance.  Respiratory: Negative for cough, choking, chest tightness, shortness of breath, wheezing and stridor.   Cardiovascular: Negative for chest pain, palpitations and leg swelling.  Gastrointestinal: Negative for abdominal pain and vomiting.  Musculoskeletal: Negative for back pain and neck pain.  Neurological: Negative for weakness and light-headedness.  Psychiatric/Behavioral: Negative for dysphoric mood.  All other systems reviewed and  are negative.    Physical Exam Updated Vital Signs There were no vitals taken for this visit.  Physical Exam  Constitutional: She is oriented to person, place, and time. She appears well-developed and well-nourished. No distress.  HENT:  Head: Normocephalic and atraumatic.  No swelling of the tongue or the uvula.  Swelling of the lower lip R>L mallempati class 2 intact phonation  Eyes: Pupils are equal, round, and reactive to light. Conjunctivae are normal.  Neck: Normal range of motion. Neck supple. No tracheal deviation present.  Cardiovascular: Normal rate, regular rhythm, normal heart sounds and intact distal pulses.  Pulmonary/Chest: Effort normal and breath sounds normal. No stridor. No respiratory distress. She has no wheezes. She has no rales.  Abdominal: Soft. Bowel sounds are normal. She exhibits no mass. There is no tenderness. There is no rebound and no guarding.  Musculoskeletal: Normal range of motion.  Neurological: She is alert and oriented to person, place, and time. She displays normal reflexes.  Skin: Skin is warm and dry. Capillary refill takes less than 2 seconds. No rash noted.  Psychiatric: She has a normal mood and affect.     ED Treatments / Results  Labs (all labs ordered are listed, but only abnormal results are displayed) Labs Reviewed - No data to display  EKG None  Radiology No results found.  Procedures Procedures (including critical care time)  Medications Ordered in ED Medications  methylPREDNISolone sodium succinate (SOLU-MEDROL) 125 mg/2 mL injection 125 mg (has no administration in time range)  famotidine (PEPCID) IVPB 20 mg premix (has no administration in time range)  loratadine (CLARITIN) tablet 10 mg (has no administration in time range)     Feels improved post medication.  No progression of symptoms.    Final Clinical Impressions(s) / ED Diagnoses  Call your doctor and inform them you will need to change medications.  Stop  lisinopril immediately.    Return for weakness, numbness, changes in vision or speech, fevers >100.4 unrelieved by medication, shortness of breath, intractable vomiting, or diarrhea, abdominal pain, Inability to tolerate liquids or food, cough, altered mental status or any concerns. No signs of systemic illness or infection. The patient is nontoxic-appearing on exam and vital signs are within normal limits. Will refer to urology for microscopy hematuria as patient is asymptomatic.    I have reviewed the triage vital signs and the nursing notes. Pertinent labs &imaging results that were available during my care of the patient were reviewed by me and considered in my medical decision making (see chart for details).  After history, exam, and medical workup I feel the patient has been appropriately medically screened and is safe for discharge home. Pertinent diagnoses were discussed with the patient. Patient was given return precautions.    Alanzo Lamb,  Audrie Kuri, MD 07/26/17 0871

## 2017-07-26 NOTE — ED Triage Notes (Signed)
Allergy started 2am tonight new to bp meds

## 2017-07-28 ENCOUNTER — Encounter: Payer: Self-pay | Admitting: Physician Assistant

## 2017-07-30 ENCOUNTER — Other Ambulatory Visit: Payer: Self-pay | Admitting: Physician Assistant

## 2017-07-30 ENCOUNTER — Encounter: Payer: Self-pay | Admitting: Physician Assistant

## 2017-07-30 DIAGNOSIS — I10 Essential (primary) hypertension: Secondary | ICD-10-CM

## 2017-08-21 ENCOUNTER — Other Ambulatory Visit: Payer: Self-pay

## 2017-08-21 ENCOUNTER — Ambulatory Visit (INDEPENDENT_AMBULATORY_CARE_PROVIDER_SITE_OTHER): Payer: Medicare Other | Admitting: Family Medicine

## 2017-08-21 ENCOUNTER — Encounter: Payer: Self-pay | Admitting: Family Medicine

## 2017-08-21 VITALS — BP 132/74 | HR 97 | Temp 99.2°F | Resp 17 | Ht 63.78 in | Wt 138.6 lb

## 2017-08-21 DIAGNOSIS — R059 Cough, unspecified: Secondary | ICD-10-CM

## 2017-08-21 DIAGNOSIS — J3089 Other allergic rhinitis: Secondary | ICD-10-CM | POA: Diagnosis not present

## 2017-08-21 DIAGNOSIS — R05 Cough: Secondary | ICD-10-CM

## 2017-08-21 MED ORDER — FLUTICASONE PROPIONATE 50 MCG/ACT NA SUSP: 1.0000 | Freq: Two times a day (BID) | NASAL | 6 refills | Status: DC

## 2017-08-21 NOTE — Patient Instructions (Addendum)
Please start an over the counter oral antihistamine (generic claritin, zyrtec, allegra or xyzal) Lets start flonase, twice a day, once symptoms controlled can try weaning to once a day.    IF you received an x-ray today, you will receive an invoice from [facility] Rad[facility]. Please contact [facility] Radiology at [phone] with questions or concerns regarding your invoice.   IF you received labwork today, you will receive an invoice from [place]. Please contact [facility] at [phone] with questions or concerns regarding your invoice.   Our billing staff will not be able to assist you with questions regarding bills from these companies.  You will be contacted with the lab results as soon as they are available. The fastest way to get your results is to activate your My Chart account. Instructions are located on the last page of this paperwork. If you have not heard from [place] regarding the results in 2 weeks, please contact this office.     Postnasal Drip Postnasal drip is the feeling of mucus going down the back of your throat. Mucus is a slimy substance that moistens and cleans your nose and throat, as well as the air pockets in face bones near your forehead and cheeks (sinuses). Small amounts of mucus pass from your nose and sinuses down the back of your throat all the time. This is normal. When you produce too much mucus or the mucus gets too thick, you can feel it. Some common causes of postnasal drip include:  Having more mucus because of: ? A cold or the flu. ? Allergies. ? Cold air. ? Certain medicines.  Having more mucus that is thicker because of: ? A sinus or nasal infection. ? Dry air. ? A food allergy.  Follow these instructions at home: Relieving discomfort  Gargle with a salt-water mixture 3-4 times a day or as needed. To make a salt-water mixture, completely dissolve -1 tsp of salt in 1 cup of warm water.  If the air in your home is dry, use a humidifier to add  moisture to the air.  Use a saline spray or container (neti pot) to flush out the nose (nasal irrigation). These methods can help clear away mucus and keep the nasal passages moist. General instructions  Take over-the-counter and prescription medicines only as told by your health care provider.  Follow instructions from your health care provider about eating or drinking restrictions. You may need to avoid caffeine.  Avoid things that you know you are allergic to (allergens), like dust, mold, pollen, pets, or certain foods.  Drink enough fluid to keep your urine pale yellow.  Keep all follow-up visits as told by your health care provider. This is important. Contact a health care provider if:  You have a fever.  You have a sore throat.  You have difficulty swallowing.  You have headache.  You have sinus pain.  You have a cough that does not go away.  The mucus from your nose becomes thick and is green or yellow in color.  You have cold or flu symptoms that last more than 10 days. Summary  Postnasal drip is the feeling of mucus going down the back of your throat.  If your health care provider approves, use nasal irrigation or a nasal spray 2?4 times a day.  Avoid things that you know you are allergic to (allergens), like dust, mold, pollen, pets, or certain foods. This information is not intended to replace advice given to you by your health care provider. Make  sure you discuss any questions you have with your health care provider. Document Released: 05/02/2016 Document Revised: 05/02/2016 Document Reviewed: 05/02/2016 Elsevier Interactive Patient Education  [employer].

## 2017-08-21 NOTE — Progress Notes (Signed)
7/22/20195:42 PM  Erin Benitez 01-15-1951, 67 y.o. female [id number]  Chief Complaint  Patient presents with  . URI    x 2 weeks, coughing all night, cough drainage is clear after using vaporizer but otherwise no drainage from cough.  Tried several otc meds for symptoms with no relief.  Throat is scratchy from coughing.    HPI:   Patient is a 67 y.o. female with past medical history significant for HTN, DM2, GERD, HLP and diverticulosis who presents today for 2 weeks of coughing  Patient reports 2 weeks of clear rhinorrhea, non productive cough, worse at night occ sneezing + ear pressure but no pain No sore throat or sinus pain No fever, chills, or SOB Has been taking OTC cold medications but still having sx  Fall Risk  08/21/2017 07/08/2017 06/03/2017 03/09/2015  Falls in the past year? No No No No     Depression screen [facility] 2/9 08/21/2017 07/08/2017 06/03/2017  Decreased Interest 0 0 0  Down, Depressed, Hopeless 0 0 0  PHQ - 2 Score 0 0 0    Allergies  Allergen Reactions  . Lisinopril Swelling  . Peanut-Containing Drug Products Anaphylaxis, Hives, Swelling and Other (See Comments)    Pistachio's - swelling of the throat     . Tramadol Nausea And Vomiting    Headache & dizziness with n&v    Prior to Admission medications   Medication Sig Start Date End Date Taking? Authorizing Provider  amLODipine (NORVASC) 10 MG tablet Take 0.5 tablets (5 mg total) by mouth daily. 07/08/17  Yes Timmothy Euler, [place] D, PA-C  blood glucose meter kit and supplies KIT Dispense based on patient and insurance preference. Use up to four times daily as directed. (FOR ICD-9 250.00, 250.01). 07/08/17  Yes Timmothy Euler [place] D, PA-C  Blood Pressure Monitor KIT 1 Units by Does not apply route daily. 06/03/17  Yes Timmothy Euler, [place] D, PA-C  esomeprazole (NEXIUM) 20 MG capsule Take 40 mg by mouth daily at 12 noon. Reported on 03/07/2015   Yes [provider]  famotidine (PEPCID) 20 MG tablet Take 1 tablet (20  mg total) by mouth 2 (two) times daily. 07/26/17  Yes Palumbo, April, MD  metFORMIN (GLUCOPHAGE-XR) 500 MG 24 hr tablet Take 4 tablets (2,000 mg total) by mouth daily with breakfast. Patient taking differently: Take 500 mg by mouth daily with breakfast.  07/08/17  Yes Timmothy Euler, [place] D, PA-C  rosuvastatin (CRESTOR) 10 MG tablet Take 1 tablet (10 mg total) by mouth daily. 06/03/17  Yes Leonie Douglas, PA-C    Past Medical History:  Diagnosis Date  . Allergy   . Asthma   . Bronchitis   . Cataract   . DM2 (diabetes mellitus, type 2) ([place])   . GERD (gastroesophageal reflux disease)   . Heart murmur   . Hypertension     Past Surgical History:  Procedure Laterality Date  . CESAREAN SECTION    . COLONOSCOPY    . TONSILLECTOMY      Social History   Tobacco Use  . Smoking status: Never Smoker  . Smokeless tobacco: Never Used  Substance Use Topics  . Alcohol use: No    Family History  Problem Relation Age of Onset  . Cancer Mother        ovarian cancer  . Heart disease Mother   . Liver disease Mother   . Hyperlipidemia Mother   . Stroke Mother   . Cancer Father        lung  cancer  . Cancer Sister   . Hyperlipidemia Sister   . Cancer Sister   . Hyperlipidemia Sister   . Diabetes Maternal Aunt   . Colon cancer Maternal Aunt        x2 aunts  . Colon cancer Maternal Uncle        2 uncles  . Colon cancer Paternal Grandfather   . Esophageal cancer Neg Hx   . Rectal cancer Neg Hx   . Stomach cancer Neg Hx     ROS Per hpi  OBJECTIVE:  Blood pressure 132/74, pulse 97, temperature 99.2 F (37.3 C), temperature source Oral, resp. rate 17, height 5' 3.78" (1.62 m), weight 138 lb 9.6 oz (62.9 kg), SpO2 98 %. Body mass index is 23.96 kg/m.   Physical Exam  Constitutional: She is oriented to person, place, and time. She appears well-developed and well-nourished.  HENT:  Head: Normocephalic and atraumatic.  Right Ear: Hearing, tympanic membrane, external ear and ear  canal normal.  Left Ear: Hearing, tympanic membrane, external ear and ear canal normal.  Nose: Mucosal edema and rhinorrhea present.  Mouth/Throat: Oropharynx is clear and moist.  Eyes: Pupils are equal, round, and reactive to light. EOM are normal.  Neck: Neck supple.  Cardiovascular: Normal rate, regular rhythm and normal heart sounds. Exam reveals no gallop and no friction rub.  No murmur heard. Pulmonary/Chest: Effort normal and breath sounds normal. She has no wheezes. She has no rales.  Lymphadenopathy:    She has no cervical adenopathy.  Neurological: She is alert and oriented to person, place, and time.  Skin: Skin is warm and dry.  Psychiatric: She has a normal mood and affect.  Nursing note and vitals reviewed.   ASSESSMENT and PLAN 1. Non-seasonal allergic rhinitis, unspecified trigger 2. Cough Discussed supportive measures, new meds r/se/b and RTC precautions. Patient educational handout given. Other orders - fluticasone (FLONASE) 50 MCG/ACT nasal spray; Place 1 spray into both nostrils 2 (two) times daily..   Return if symptoms worsen or fail to improve.    [name], MD Primary Care at [place], [place] [address] Ph.  [phone] Fax [phone]

## 2017-09-06 ENCOUNTER — Encounter: Payer: Self-pay | Admitting: Family Medicine

## 2017-09-06 DIAGNOSIS — E1169 Type 2 diabetes mellitus with other specified complication: Secondary | ICD-10-CM | POA: Insufficient documentation

## 2017-09-06 DIAGNOSIS — E119 Type 2 diabetes mellitus without complications: Secondary | ICD-10-CM | POA: Insufficient documentation

## 2017-09-30 ENCOUNTER — Other Ambulatory Visit: Payer: Self-pay

## 2017-09-30 ENCOUNTER — Encounter: Payer: Self-pay | Admitting: Physician Assistant

## 2017-09-30 ENCOUNTER — Ambulatory Visit (INDEPENDENT_AMBULATORY_CARE_PROVIDER_SITE_OTHER): Payer: Medicare Other | Admitting: Physician Assistant

## 2017-09-30 VITALS — BP 144/80 | HR 76 | Temp 98.6°F | Resp 18 | Ht 63.78 in | Wt 140.6 lb

## 2017-09-30 DIAGNOSIS — I10 Essential (primary) hypertension: Secondary | ICD-10-CM | POA: Diagnosis not present

## 2017-09-30 DIAGNOSIS — Z111 Encounter for screening for respiratory tuberculosis: Secondary | ICD-10-CM

## 2017-09-30 DIAGNOSIS — E119 Type 2 diabetes mellitus without complications: Secondary | ICD-10-CM

## 2017-09-30 LAB — POCT GLYCOSYLATED HEMOGLOBIN (HGB A1C): HEMOGLOBIN A1C: 10 % — AB (ref 4.0–5.6)

## 2017-09-30 MED ORDER — AMLODIPINE BESYLATE 10 MG PO TABS: 5.0000 mg | ORAL_TABLET | Freq: Every day | ORAL | 1 refills | Status: DC

## 2017-09-30 MED ORDER — METFORMIN HCL ER 500 MG PO TB24: 2000.0000 mg | ORAL_TABLET | Freq: Every day | ORAL | 0 refills | Status: DC

## 2017-09-30 NOTE — Patient Instructions (Addendum)
In terms of elevated blood pressure, I would like you to check your blood pressure at least a couple times over the next week outside of the office and document these values. It is best if you check the blood pressure at different times in the day. Your goal is <140/90. If your values are consistently above this goal, please return to office for further evaluation.  If you start to have chest pain, blurred vision, shortness of breath, severe headache, lower leg swelling, or nausea/vomiting please seek care immediately here or at the ED.   For diabetes; start metformin XR today. Days 1-4: take one pill in the am, Days 5-7: Take two tablets in the am, Days 8-11: Take 3 tablets in the am, Thereafter: take 4 tablets in the am.   Return in 2 weeks for reevaluation.   If you have lab work done today you will be contacted with your lab results within the next 2 weeks.  If you have not heard from [place] then please contact us. The fastest way to get your results is to register for My Chart.   Diabetes is a very complicated disease...lets simplify it.   An easy way to look at it to understand the complications is if you think of the extra sugar floating in your blood stream as glass shards floating through your blood stream.   Diabetes affects your small vessels first: 1) The glass shards (sugar) scrapes down the tiny blood vessels in your eyes and lead to diabetic retinopathy, the leading cause of blindness in the [place]. Diabetes is the leading cause of newly diagnosed adult (69 to 67 years of age) blindness in the [place].  2) The glass shards scratches down the tiny vessels of your legs leading to nerve damage called neuropathy and can lead to amputations of your feet. More than 60% of all non-traumatic amputations of lower limbs occur in people with diabetes.  3) Over time the small vessels in your brain are shredded and closed off, individually this does not cause any problems but over a long period of  time many of the small vessels being blocked can lead to Vascular Dementia.   4) Your kidney's are a filter system and have a "net" that keeps certain things in the body and lets bad things out. Sugar shreds this net and leads to kidney damage and eventually failure. Decreasing the sugar that is destroying the net and certain blood pressure medications can help stop or decrease progression of kidney disease. Diabetes was the primary cause of kidney failure in 44 percent of all new cases in 2011.  5) Diabetes also destroys the small vessels in your penis that lead to erectile dysfunction. Eventually the vessels are so damaged that you may not be responsive to cialis or viagra.   Diabetes and your large vessels: Your larger vessels consist of your coronary arteries in your heart and the carotid vessels to your brain. Diabetes or even increased sugars put you at 300% increased risk of heart attack and stroke and this is why.. The sugar scrapes down your large blood vessels and your body sees this as an internal injury and tries to repair itself. Just like you get a scab on your skin, your platelets will stick to the blood vessel wall trying to heal it. This is why we have diabetics on low dose aspirin daily, this prevents the platelets from sticking and can prevent plaque formation. In addition, your body takes cholesterol and tries to  shove it into the open wound. This is why we want your LDL, or bad cholesterol, below 70.   The combination of platelets and cholesterol over 5-10 years forms plaque that can break off and cause a heart attack or stroke.   PLEASE REMEMBER:   Diabetes is preventable! Up to 64 percent of complications and morbidities among individuals with type 2 diabetes can be prevented, delayed, or effectively treated and minimized with regular visits to a health professional, appropriate monitoring and medication, and a healthy diet and lifestyle.  Here is some information to help you  keep your heart healthy: Move it! - Aim for 30 mins of activity every day. Take it slowly at first. Talk to [place] before starting any new exercise program.   Lose it.  -Body Mass Index (BMI) can indicate if you need to lose weight. A healthy range is 18.5-24.9. For a BMI calculator, go to [employer]  Waist Management -Excess abdominal fat is a risk factor for heart disease, diabetes, asthma, stroke and more. Ideal waist circumference is less than 35" for women and less than 40" for men.   Eat Right -focus on fruits, vegetables, whole grains, and meals you make yourself. Avoid foods with trans fat and high sugar/sodium content.   Snooze or Snore? - Loud snoring can be a sign of sleep apnea, a significant risk factor for high blood pressure, heart attach, stroke, and heart arrhythmias.  Kick the habit -Quit Smoking! Avoid second hand smoke. A single cigarette raises your blood pressure for 20 mins and increases the risk of heart attack and stroke for the next 24 hours.   Are Aspirin and Supplements right for you? -Add ENTERIC COATED low dose 81 mg Aspirin daily OR can do every other day if you have easy bruising to protect your heart and head. As well as to reduce risk of Colon Cancer by 20 %, Skin Cancer by 26 % , Melanoma by 46% and Pancreatic cancer by 60%  Say "No to Stress -There may be little you can do about problems that cause stress. However, techniques such as long walks, meditation, and exercise can help you manage it.   Start Now! - Make changes one at a time and set reasonable goals to increase your likelihood of success.       IF you received an x-ray today, you will receive an invoice from [facility]. Please contact [facility] at [phone] with questions or concerns regarding your invoice.   IF you received labwork today, you will receive an invoice from [facility]. Please contact [facility] at [phone] with questions or concerns regarding your  invoice.   Our billing staff will not be able to assist you with questions regarding bills from these companies.  You will be contacted with the lab results as soon as they are available. The fastest way to get your results is to activate your My Chart account. Instructions are located on the last page of this paperwork. If you have not heard from [place] regarding the results in 2 weeks, please contact this office.

## 2017-09-30 NOTE — Progress Notes (Signed)
MRN: [id number] DOB: [birth year]  Subjective:   Erin Benitez is a 67 y.o. female presenting for follow up on Hypertension and T2DM.  Also needs work form completed. 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    HTN: Currently managed with amlodipine 62m.Has had ADR to both HCTZ (eye issue) and lisinopril (angioedema) in past.  Patient is checking blood pressure at home, range is 1371Gsystolic. Denies lightheadedness, dizziness, chronic headache, double vision, chest pain, shortness of breath, heart racing, palpitations, nausea, vomiting, abdominal pain, hematuria, lower leg swelling. Lifestyle: Avoiding excessive salt intake. Has not had her stress test with cardiology yet.    T2DM: Dx in 05/2017. A1C was 8.7.  Patient is currently managed with metformin. She is only taking it on the weekends because she has diarrhea when she takes it. She was given the XR version of metformin but does not remember picking it up. Has had some urinary frequency, which she initally had when diabetes was first dx.  Denies metallic taste, hypoglycemia, nausea, and vomiting. Patient is not checking home blood sugars.  Patient denies foot ulcerations, nausea, paresthesia of the feet, polydipsia, visual disturbances, vomiting and weight loss. Patient is checking their feet daily. No foot concerns. Last diabetic eye exam eye exam 12/2016. No retinopathy. Diet: Has changed a lot since last visit. Changed from canned food to fresh food, stopped eating on desserts. Started drinking water. Exercise includes walking 30 minutes at least 5 days a week. Denies smoking or alcohol use.   Review of Systems  Constitutional: Negative for weight loss.  Gastrointestinal: Negative for abdominal pain.  Musculoskeletal: Negative for back pain and joint pain.  Skin: Negative for rash.  Neurological: Negative for weakness.  Psychiatric/Behavioral: Negative for depression, hallucinations, memory loss, substance  abuse and suicidal ideas. The patient is not nervous/anxious.      Erin Benitez a current medication list which includes the following prescription(s): amlodipine, blood glucose meter kit and supplies, blood pressure monitor, esomeprazole, fluticasone, metformin, and rosuvastatin. Also is allergic to lisinopril; peanut-containing drug products; and tramadol.  Erin Benitez  has a past medical history of Allergy, Asthma, Bronchitis, Cataract, DM2 (diabetes mellitus, type 2) ([place], GERD (gastroesophageal reflux disease), Heart murmur, and Hypertension. Also  has a past surgical history that includes Cesarean section; Tonsillectomy; and Colonoscopy.    Social History   Socioeconomic History  . Marital status: Single    Spouse name: Not on file  . Number of children: 1  . Years of education: Not on file  . Highest education level: Not on file  Occupational History  . Occupation: ETourist information centre manager Social Needs  . Financial resource strain: Not on file  . Food insecurity:    Worry: Not on file    Inability: Not on file  . Transportation needs:    Medical: Not on file    Non-medical: Not on file  Tobacco Use  . Smoking status: Never Smoker  . Smokeless tobacco: Never Used  Substance and Sexual Activity  . Alcohol use: No  . Drug use: No  . Sexual activity: Yes  Lifestyle  . Physical activity:    Days per week: Not on file    Minutes per session: Not on file  . Stress: Not on file  Relationships  . Social connections:    Talks on phone: Not on file    Gets together: Not on file    Attends religious service: Not on file    Active member  of club or organization: Not on file    Attends meetings of clubs or organizations: Not on file    Relationship status: Not on file  . Intimate partner violence:    Fear of current or ex partner: Not on file    Emotionally abused: Not on file    Physically abused: Not on file    Forced sexual activity: Not on file  Other Topics Concern  . Not on file  Social  History Narrative   She reports she is single. She has 1 daughter. She is an Tourist information centre manager. 2 caffeinated beverages daily.    Objective:   Vitals: BP (!) 144/80   Pulse 76   Temp 98.6 F (37 C) (Oral)   Resp 18   Ht 5' 3.78" (1.62 m)   Wt 140 lb 9.6 oz (63.8 kg)   SpO2 98%   BMI 24.30 kg/m   Physical Exam  Constitutional: She is oriented to person, place, and time. She appears well-developed and well-nourished. No distress.  HENT:  Head: Normocephalic and atraumatic.  Mouth/Throat: Uvula is midline, oropharynx is clear and moist and mucous membranes are normal.  Eyes: Pupils are equal, round, and reactive to light. Conjunctivae and EOM are normal.  Neck: Normal range of motion.  Cardiovascular: Normal rate, regular rhythm and intact distal pulses.  Murmur heard.  Systolic murmur is present. Pulmonary/Chest: Effort normal and breath sounds normal. She has no wheezes. She has no rhonchi. She has no rales.  Musculoskeletal:       Right lower leg: She exhibits no swelling.       Left lower leg: She exhibits no swelling.  Neurological: She is alert and oriented to person, place, and time.  Skin: Skin is warm and dry.  Psychiatric: She has a normal mood and affect.  Vitals reviewed.   Results for orders placed or performed in visit on 09/30/17 (from the past 24 hour(s))  POCT glycosylated hemoglobin (Hb A1C)     Status: Abnormal   Collection Time: 09/30/17  8:43 AM  Result Value Ref Range   Hemoglobin A1C 10.0 (A) 4.0 - 5.6 %   HbA1c POC (<> result, manual entry)     HbA1c, POC (prediabetic range)     HbA1c, POC (controlled diabetic range)      BP Readings from Last 3 Encounters:  09/30/17 (!) 144/80  08/21/17 132/74  07/26/17 (!) 165/91   Wt Readings from Last 3 Encounters:  09/30/17 140 lb 9.6 oz (63.8 kg)  08/21/17 138 lb 9.6 oz (62.9 kg)  07/08/17 142 lb 3.2 oz (64.5 kg)     Assessment and Plan :  1. Essential hypertension Slightly uncontrolled in office. Pt's  home recordings are well controlled. Rec documenting these and bringing them to next OV. She is asx. Continue medication regimen. Follow up in 2 weeks for reevaluation.  - amLODipine (NORVASC) 10 MG tablet; Take 0.5 tablets (5 mg total) by mouth daily.  Dispense: 90 tablet; Refill: 1  2. Type 2 diabetes mellitus without complication, without long-term current use of insulin (HCC) Uncontrolled. A1C increased from 8.7 to 10. Likely due to the fact that she is only taking metformin 2 days of the week due to diarrhea. Had long discussion with pt about tx options for diabetes and the potential complications of uncontrolled diabetes at this level. She will try the XR version of metformin, which was sent to her pharmacy in 07/2017. Will resend Rx as we cannot get ahold of the pharmacy. Rec  tapering up to a total of 2029m daily. Informed pt that if she cannot tolerate this medication, to schedule an appointment ASAP so we can find something she can tolerate. Continue healthy diet and exercise.  Encouraged to start checking sugars. FBS goal is 90-110. F/u in 2 weeks for reevaluation.  - CMP14+EGFR - POCT glycosylated hemoglobin (Hb A1C) - metFORMIN (GLUCOPHAGE-XR) 500 MG 24 hr tablet; Take 4 tablets (2,000 mg total) by mouth daily with breakfast.  Dispense: 360 tablet; Refill: 0 - Urinalysis, dipstick only  3. 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 Work form completed, scanned, and returned to pt. F/u in 3 days for TB skin test reading.  - TB Skin Test  A total of 40 minutes was spent in the room with the patient, greater than 50% of which was in counseling/coordination of care regarding uncontrolled HTN and T2DM.  BTenna Delaine PA-C  [facility] 09/30/2017 9:23 AM

## 2017-09-30 NOTE — Progress Notes (Signed)
  Tuberculosis Risk Questionnaire  1. No Were you born outside the [place] in one of the following parts of the world: [place], [place], [place], [place] or [place]?    2. No Have you traveled outside the [place] and lived for more than one month in one of the following parts of the world: [place], [place], [place], [place] or [place]?    3. Yes Diabetes0 Do you have a compromised immune system such as from any of the following conditions:HIV/AIDS, organ or bone marrow transplantation, diabetes, immunosuppressive medicines (e.g. Prednisone, Remicaide), leukemia, lymphoma, cancer of the head or neck, gastrectomy or jejunal bypass, end-stage renal disease (on dialysis), or silicosis?     4. Yes [place]- Have you ever or do you plan on working in: a residential care center, a health care facility, a jail or prison or homeless shelter?    5. No Have you ever: injected illegal drugs, used crack cocaine, lived in a homeless shelter  or been in jail or prison?     6. No Have you ever been exposed to anyone with infectious tuberculosis?  7. No Have you ever had a BCG vaccine? (BCG is a vaccine for tuberculosis  (TB) used in OTHER countries, NOT in the [place]).  8. No Have you ever been advised by a health care provider NOT to have a TB skin test?  9. No Have you ever had a POSITIVE TB skin test?  IF SO, when? n/a  IF SO, were you treated with INH? n/a  IF SO, where? n/a  Tuberculosis Symptom Questionnaire  Do you currently have any of the following symptoms?  1. No  Unexplained cough lasting more than 3 weeks?   2. No Unexplained fever lasting more than 3 weeks.   3. No Night Sweats (sweating that leaves the bedclothes and sheets wet)     4. No Shortness of Breath   5. No Chest Pain   6. No Unintentional weight loss    7. No Unexplained fatigue (very tired for no reason)  .

## 2017-10-01 LAB — CMP14+EGFR
ALBUMIN: 4.8 g/dL (ref 3.6–4.8)
ALK PHOS: 112 IU/L (ref 39–117)
ALT: 18 IU/L (ref 0–32)
AST: 17 IU/L (ref 0–40)
Albumin/Globulin Ratio: 1.7 (ref 1.2–2.2)
BILIRUBIN TOTAL: 0.4 mg/dL (ref 0.0–1.2)
BUN / CREAT RATIO: 16 (ref 12–28)
BUN: 12 mg/dL (ref 8–27)
CHLORIDE: 98 mmol/L (ref 96–106)
CO2: 18 mmol/L — ABNORMAL LOW (ref 20–29)
Calcium: 9.7 mg/dL (ref 8.7–10.3)
Creatinine, Ser: 0.73 mg/dL (ref 0.57–1.00)
GFR calc non Af Amer: 86 mL/min/{1.73_m2} (ref >59)
GFR, EST AFRICAN AMERICAN: 99 mL/min/{1.73_m2} (ref >59)
GLOBULIN, TOTAL: 2.9 g/dL (ref 1.5–4.5)
GLUCOSE: 257 mg/dL — AB (ref 65–99)
POTASSIUM: 4.9 mmol/L (ref 3.5–5.2)
SODIUM: 138 mmol/L (ref 134–144)
TOTAL PROTEIN: 7.7 g/dL (ref 6.0–8.5)

## 2017-10-02 ENCOUNTER — Encounter: Payer: Self-pay | Admitting: Physician Assistant

## 2017-10-03 ENCOUNTER — Ambulatory Visit (INDEPENDENT_AMBULATORY_CARE_PROVIDER_SITE_OTHER): Payer: Medicare Other | Admitting: Family Medicine

## 2017-10-03 DIAGNOSIS — Z111 Encounter for screening for respiratory tuberculosis: Secondary | ICD-10-CM

## 2017-10-03 LAB — URINALYSIS, DIPSTICK ONLY
BILIRUBIN UA: NEGATIVE
KETONES UA: NEGATIVE
Leukocytes, UA: NEGATIVE
NITRITE UA: NEGATIVE
Protein, UA: NEGATIVE
RBC UA: NEGATIVE
SPEC GRAV UA: 1.026 (ref 1.005–1.030)
UUROB: 0.2 mg/dL (ref 0.2–1.0)
pH, UA: 7 (ref 5.0–7.5)

## 2017-10-03 LAB — TB SKIN TEST
INDURATION: 0 mm
TB Skin Test: NEGATIVE

## 2017-10-03 NOTE — Patient Instructions (Signed)
° ° ° °  If you have lab work done today you will be contacted with your lab results within the next 2 weeks.  If you have not heard from us then please contact us. The fastest way to get your results is to register for My Chart. ° ° °IF you received an x-ray today, you will receive an invoice from [facility]. Please contact [facility] at [phone] with questions or concerns regarding your invoice.  ° °IF you received labwork today, you will receive an invoice from [facility]. Please contact [facility] at [phone] with questions or concerns regarding your invoice.  ° °Our billing staff will not be able to assist you with questions regarding bills from these companies. ° °You will be contacted with the lab results as soon as they are available. The fastest way to get your results is to activate your My Chart account. Instructions are located on the last page of this paperwork. If you have not heard from us regarding the results in 2 weeks, please contact this office. °  ° ° ° °

## 2017-10-05 ENCOUNTER — Ambulatory Visit: Payer: Medicare Other | Admitting: Physician Assistant

## 2018-05-28 ENCOUNTER — Telehealth: Payer: Medicare Other | Admitting: Family

## 2018-05-28 DIAGNOSIS — Z20822 Contact with and (suspected) exposure to covid-19: Secondary | ICD-10-CM

## 2018-05-28 DIAGNOSIS — R6889 Other general symptoms and signs: Secondary | ICD-10-CM | POA: Diagnosis not present

## 2018-05-28 MED ORDER — PROMETHAZINE-DM 6.25-15 MG/5ML PO SYRP: 5.0000 mL | ORAL_SOLUTION | Freq: Three times a day (TID) | ORAL | 0 refills | Status: DC | PRN

## 2018-05-28 MED ORDER — ALBUTEROL SULFATE HFA 108 (90 BASE) MCG/ACT IN AERS: 2.0000 | INHALATION_SPRAY | Freq: Four times a day (QID) | RESPIRATORY_TRACT | 0 refills | Status: DC | PRN

## 2018-05-28 MED ORDER — BENZONATATE 100 MG PO CAPS: 100.0000 mg | ORAL_CAPSULE | Freq: Three times a day (TID) | ORAL | 0 refills | Status: DC | PRN

## 2018-05-28 NOTE — Progress Notes (Signed)
E-Visit for Corona Virus Screening  Based on your current symptoms, you may very well have the virus, however your symptoms are mild. Currently, not all patients are being tested. If the symptoms are mild and there is not a known exposure, performing the test is not indicated.  Called patient and she is stable. States she is worried about going back to work. Discussed if symptoms worsen to go ED. She agrees.   Approximately 5 minutes was spent documenting and reviewing patient's chart.    Coronavirus disease 2019 (COVID-19) is a respiratory illness that can spread from person to person. The virus that causes COVID-19 is a new virus that was first identified in the country of [place] but is now found in multiple other countries and has spread to the [place].  Symptoms associated with the virus are mild to severe fever, cough, and shortness of breath. There is currently no vaccine to protect against COVID-19, and there is no specific antiviral treatment for the virus.   To be considered HIGH RISK for Coronavirus (COVID-19), you have to meet the following criteria:  . Traveled to [place], [place], [place], [place] or [place]; or in the [place] to [place], [place], [place], or [place]; and have fever, cough, and shortness of breath within the last 2 weeks of travel OR  . Been in close contact with a person diagnosed with COVID-19 within the last 2 weeks and have fever, cough, and shortness of breath  . IF YOU DO NOT MEET THESE CRITERIA, YOU ARE CONSIDERED LOW RISK FOR COVID-19.   It is vitally important that if you feel that you have an infection such as this virus or any other virus that you stay home and away from places where you may spread it to others.  You should self-quarantine for 14 days if you have symptoms that could potentially be coronavirus and avoid contact with people age 68 and older.   You can use medication such as A prescription cough medication called Tessalon  Perles 100 mg. You may take 1-2 capsules every 8 hours as needed for cough and A prescription inhaler called Albuterol MDI 90 mcg /actuation 2 puffs every 4 hours as needed for shortness of breath, wheezing, cough.  You may also take acetaminophen (Tylenol) as needed for fever.   Reduce your risk of any infection by using the same precautions used for avoiding the common cold or flu:  Marland Kitchen Wash your hands often with soap and warm water for at least 20 seconds.  If soap and water are not readily available, use an alcohol-based hand sanitizer with at least 60% alcohol.  . If coughing or sneezing, cover your mouth and nose by coughing or sneezing into the elbow areas of your shirt or coat, into a tissue or into your sleeve (not your hands). . Avoid shaking hands with others and consider head nods or verbal greetings only. . Avoid touching your eyes, nose, or mouth with unwashed hands.  . Avoid close contact with people who are sick. . Avoid places or events with large numbers of people in one location, like concerts or sporting events. . Carefully consider travel plans you have or are making. . If you are planning any travel outside or inside the [place], visit the CDC's Travelers' Health webpage for the latest health notices. . If you have some symptoms but not all symptoms, continue to monitor at home and seek medical attention if your symptoms worsen. . If you are  having a medical emergency, call 911.  HOME CARE . Only take medications as instructed by your medical team. . Drink plenty of fluids and get plenty of rest. . A steam or ultrasonic humidifier can help if you have congestion.   GET HELP RIGHT AWAY IF: . You develop worsening fever. . You become short of breath . You cough up blood. . Your symptoms become more severe MAKE SURE YOU   Understand these instructions.  Will watch your condition.  Will get help right away if you are not doing well or get worse.  Your e-visit answers  were reviewed by a board certified advanced clinical practitioner to complete your personal care plan.  Depending on the condition, your plan could have included both over the counter or prescription medications.  If there is a problem please reply once you have received a response from your provider. Your safety is important to [place].  If you have drug allergies check your prescription carefully.    You can use MyChart to ask questions about today's visit, request a non-urgent call back, or ask for a work or school excuse for 24 hours related to this e-Visit. If it has been greater than 24 hours you will need to follow up with your provider, or enter a new e-Visit to address those concerns. You will get an e-mail in the next two days asking about your experience.  I hope that your e-visit has been valuable and will speed your recovery. Thank you for using e-visits.

## 2018-05-28 NOTE — Addendum Note (Signed)
Addended by: Evelina Dun A on: 05/28/2018 07:38 PM   Modules accepted: Orders

## 2018-06-07 ENCOUNTER — Telehealth: Payer: Self-pay | Admitting: *Deleted

## 2018-06-07 NOTE — Telephone Encounter (Signed)
Calling to schedule TOC from [facility] [employer]

## 2018-07-18 ENCOUNTER — Encounter: Payer: Self-pay | Admitting: Family Medicine

## 2018-07-18 ENCOUNTER — Ambulatory Visit (INDEPENDENT_AMBULATORY_CARE_PROVIDER_SITE_OTHER): Payer: Medicare Other | Admitting: Family Medicine

## 2018-07-18 ENCOUNTER — Other Ambulatory Visit: Payer: Self-pay

## 2018-07-18 VITALS — BP 114/80 | Ht 64.0 in | Wt 140.0 lb

## 2018-07-18 VITALS — BP 150/92 | HR 108 | Temp 98.2°F | Ht 64.0 in | Wt 144.2 lb

## 2018-07-18 DIAGNOSIS — R0602 Shortness of breath: Secondary | ICD-10-CM

## 2018-07-18 DIAGNOSIS — Z0000 Encounter for general adult medical examination without abnormal findings: Secondary | ICD-10-CM

## 2018-07-18 DIAGNOSIS — E119 Type 2 diabetes mellitus without complications: Secondary | ICD-10-CM | POA: Diagnosis not present

## 2018-07-18 DIAGNOSIS — K219 Gastro-esophageal reflux disease without esophagitis: Secondary | ICD-10-CM | POA: Diagnosis not present

## 2018-07-18 DIAGNOSIS — R011 Cardiac murmur, unspecified: Secondary | ICD-10-CM | POA: Diagnosis not present

## 2018-07-18 DIAGNOSIS — E785 Hyperlipidemia, unspecified: Secondary | ICD-10-CM | POA: Diagnosis not present

## 2018-07-18 DIAGNOSIS — E782 Mixed hyperlipidemia: Secondary | ICD-10-CM

## 2018-07-18 DIAGNOSIS — I10 Essential (primary) hypertension: Secondary | ICD-10-CM

## 2018-07-18 DIAGNOSIS — R079 Chest pain, unspecified: Secondary | ICD-10-CM | POA: Diagnosis not present

## 2018-07-18 LAB — POCT URINALYSIS DIP (MANUAL ENTRY)
Bilirubin, UA: NEGATIVE — AB
Blood, UA: NEGATIVE
Glucose, UA: 500 mg/dL — AB
Leukocytes, UA: NEGATIVE
Nitrite, UA: NEGATIVE
Protein Ur, POC: NEGATIVE mg/dL
Spec Grav, UA: 1.01 (ref 1.010–1.025)
Urobilinogen, UA: 0.2 E.U./dL
pH, UA: 6 (ref 5.0–8.0)

## 2018-07-18 LAB — POCT GLYCOSYLATED HEMOGLOBIN (HGB A1C): Hemoglobin A1C: 12 % — AB (ref 4.0–5.6)

## 2018-07-18 LAB — LIPID PANEL

## 2018-07-18 MED ORDER — PIOGLITAZONE HCL 30 MG PO TABS: 30.0000 mg | ORAL_TABLET | Freq: Every day | ORAL | 2 refills | Status: DC

## 2018-07-18 MED ORDER — GLIPIZIDE 5 MG PO TABS: 5.0000 mg | ORAL_TABLET | Freq: Two times a day (BID) | ORAL | 3 refills | Status: DC

## 2018-07-18 MED ORDER — BLOOD GLUCOSE METER KIT: PACK | 5 refills | Status: DC

## 2018-07-18 MED ORDER — TRIAMTERENE-HCTZ 37.5-25 MG PO TABS: 1.0000 | ORAL_TABLET | Freq: Every day | ORAL | 1 refills | Status: DC

## 2018-07-18 MED ORDER — ROSUVASTATIN CALCIUM 40 MG PO TABS: 40.0000 mg | ORAL_TABLET | Freq: Every day | ORAL | 3 refills | Status: DC

## 2018-07-18 MED ORDER — ESOMEPRAZOLE MAGNESIUM 20 MG PO CPDR: 20.0000 mg | DELAYED_RELEASE_CAPSULE | Freq: Two times a day (BID) | ORAL | 0 refills | Status: DC

## 2018-07-18 MED ORDER — AMLODIPINE BESYLATE 10 MG PO TABS: 10.0000 mg | ORAL_TABLET | Freq: Every day | ORAL | 1 refills | Status: DC

## 2018-07-18 NOTE — Progress Notes (Signed)
6/17/20202:08 PM  Erin Benitez 1950/04/20, 68 y.o., female [id number]  Chief Complaint  Patient presents with  . Shortness of Breath    for 2 months   . Sore Throat    feels like something is stuck in the throat  . Hypertension  . Diabetes    HPI:   Patient is a 68 y.o. female with past medical history significant for HTN, DM2, GERD, HLP, and diverticulosis who presents today for transfer of care  Previous PCP Ellery Plunk Last OV Aug 2019  Stopped metformin due to diarrhea, even with XR Has been without any medication for diabetes since late last year Does not check her cbgs Checks BP occasionally  Denies diagnosis of asthma, albuterol given when she called for concerns of covid about a month ago  Reports constant SOB associated with chest tightness and pain/heavy/pressure mostly at night, endorses chest pain with activity such as carrying her trash, midsternal, taking tums and rest helps, twice it has radiated down her left arm - sharp, feels a bit fatigued, no nausea, diaphoresis, palpitations, edema  Denies any fever, chills, cough, orthopnea, PND, abd pain, vomiting, diarrhea, constipation, dysuria, hematuria, black tarry stools  Endorses increased thirst and urination  Takes PPI daily, reports that reflux is not well controlled  Colonoscopy in 2016 - repeat in 2021  Saw cards, Dr Einar Gip, May 2019 - did not complete workup Plan was to add HCTZ and increase rouvastatin, stress test  Patient used to work in day care, requesting exemption due 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 0 0 No No No  Number falls in past yr: 0 0 - - -  Injury with Fall? 0 0 - - -  Follow up - Falls  evaluation completed;Education provided;Falls prevention discussed - - -     Depression screen [facility] 2/9 07/18/2018 09/30/2017 08/21/2017  Decreased Interest 0 0 0  Down, Depressed, Hopeless 0 0 0  PHQ - 2 Score 0 0 0    Allergies  Allergen Reactions  . Lisinopril Swelling  . Peanut-Containing Drug Products Anaphylaxis, Hives, Swelling and Other (See Comments)    Pistachio's - swelling of the throat     . Tramadol Nausea And Vomiting    Headache & dizziness with n&v    Prior to Admission medications   Medication Sig Start Date Benitez Date Taking? Authorizing Provider  albuterol (VENTOLIN HFA) 108 (90 Base) MCG/ACT inhaler Inhale 2 puffs into the lungs every 6 (six) hours as needed for wheezing or shortness of breath. 05/28/18  Yes Hawks, Christy A, FNP  amLODipine (NORVASC) 10 MG tablet Take 0.5 tablets (5 mg total) by mouth daily. 09/30/17  Yes Tenna Delaine D, PA-C  Blood Pressure Monitor KIT 1 Units by Does not apply route daily. 06/03/17  Yes Timmothy Euler, [place] D, PA-C  esomeprazole (NEXIUM) 20 MG capsule Take 40 mg by mouth daily at 12 noon. Reported on 03/07/2015   Yes [provider]  fluticasone (FLONASE) 50 MCG/ACT nasal spray Place 1 spray into both nostrils 2 (two) times daily. 08/21/17  Yes [name], MD  rosuvastatin (CRESTOR) 10 MG tablet Take 1 tablet (10 mg total) by mouth  daily. 06/03/17  Yes Leonie Douglas, PA-C    Past Medical History:  Diagnosis Date  . Allergy   . Asthma   . Bronchitis   . Cataract   . DM2 (diabetes mellitus, type 2) ([place])   . GERD (gastroesophageal reflux disease)   . Heart murmur   . Hypertension     Past Surgical History:  Procedure Laterality Date  . CESAREAN SECTION    . COLONOSCOPY    . TONSILLECTOMY      Social History   Tobacco Use  . Smoking status: Never Smoker  . Smokeless tobacco: Never Used  Substance Use Topics  . Alcohol use: No    Family History  Problem Relation Age of Onset  . Cancer Mother         ovarian cancer  . Heart disease Mother   . Liver disease Mother   . Hyperlipidemia Mother   . Stroke Mother   . Cancer Father        lung cancer  . Cancer Sister   . Hyperlipidemia Sister   . Cancer Sister   . Hyperlipidemia Sister   . Diabetes Maternal Aunt   . Colon cancer Maternal Aunt        x2 aunts  . Colon cancer Maternal Uncle        2 uncles  . Colon cancer Paternal Grandfather   . Esophageal cancer Neg Hx   . Rectal cancer Neg Hx   . Stomach cancer Neg Hx     ROS Per hpi  OBJECTIVE:  Today's Vitals   07/18/18 1401  BP: (!) 150/92  Pulse: (!) 108  Temp: 98.2 F (36.8 C)  TempSrc: Oral  Weight: 144 lb 3.2 oz (65.4 kg)  Height: 5' 4"  (1.626 m)  PF: 96 L/min   Body mass index is 24.75 kg/m.  Wt Readings from Last 3 Encounters:  07/18/18 144 lb 3.2 oz (65.4 kg)  07/18/18 140 lb (63.5 kg)  09/30/17 140 lb 9.6 oz (63.8 kg)    Physical Exam Vitals signs and nursing note reviewed.  Constitutional:      Appearance: She is well-developed.  HENT:     Head: Normocephalic and atraumatic.     Mouth/Throat:     Pharynx: No oropharyngeal exudate.  Eyes:     General: No scleral icterus.    Conjunctiva/sclera: Conjunctivae normal.     Pupils: Pupils are equal, round, and reactive to light.  Neck:     Musculoskeletal: Neck supple.  Cardiovascular:     Rate and Rhythm: Normal rate and regular rhythm.     Heart sounds: Murmur present. No friction rub. No gallop.   Pulmonary:     Effort: Pulmonary effort is normal.     Breath sounds: Normal breath sounds. No decreased breath sounds, wheezing, rhonchi or rales.  Chest:     Chest wall: No tenderness.  Abdominal:     General: Bowel sounds are normal.     Palpations: Abdomen is soft. There is no hepatomegaly, splenomegaly or mass.     Tenderness: There is no abdominal tenderness.  Musculoskeletal:     Right lower leg: No edema.     Left lower leg: No edema.  Skin:    General: Skin is warm and dry.   Neurological:     Mental Status: She is alert and oriented to person, place, and time.     Results for orders placed or performed in visit on 07/18/18 (from the past 24 hour(s))  POCT A1C     Status: Abnormal   Collection Time: 07/18/18  2:33 PM  Result Value Ref Range   Hemoglobin A1C 12.0 (A) 4.0 - 5.6 %   HbA1c POC (<> result, manual entry)     HbA1c, POC (prediabetic range)     HbA1c, POC (controlled diabetic range)    POCT urinalysis dipstick     Status: Abnormal   Collection Time: 07/18/18  2:54 PM  Result Value Ref Range   Color, UA yellow yellow   Clarity, UA clear clear   Glucose, UA =500 (A) negative mg/dL   Bilirubin, UA negative (A) negative   Ketones, POC UA trace (5) (A) negative mg/dL   Spec Grav, UA 1.010 1.010 - 1.025   Blood, UA negative negative   pH, UA 6.0 5.0 - 8.0   Protein Ur, POC negative negative mg/dL   Urobilinogen, UA 0.2 0.2 or 1.0 E.U./dL   Nitrite, UA Negative Negative   Leukocytes, UA Negative Negative    My interpretation of EKG:  Sinus, HR 98, nonspecific T wave abnormalities, PR 116  ASSESSMENT and PLAN  1. Type 2 diabetes mellitus without complication, without long-term current use of insulin (HCC) Uncontrolled. Patient will not do injections. Intolerant of metformin. Adding actos and glipizide as step therapy required. Discussed new meds r/se/b and RTC precautions. - Comprehensive metabolic panel - Microalbumin/Creatinine Ratio, Urine - POCT urinalysis dipstick - POCT A1C  2. Essential hypertension Uncontrolled. Adding HCTZ. Cont home monitoring.  - CBC - Comprehensive metabolic panel - Care order/instruction: - amLODipine (NORVASC) 10 MG tablet; Take 1 tablet (10 mg total) by mouth daily.  3. Hyperlipidemia, unspecified hyperlipidemia type Increased crestor as recommended by last cards note.  - Lipid panel - TSH  4. SOB (shortness of breath) No signs or symptoms of CHF. Lungs clear on exam. CXR pending. Referring to cards  for further eval. Consider pulm eval.  - EKG 12-Lead - Ambulatory referral to Cardiology - DG Chest 2 View; Future  5. Heart murmur - Ambulatory referral to Cardiology  6. Chest pain, unspecified type Nocturnal sx suggestive of reflux. However CP with DOE are concerning. Referring to cards for further eval and treatment. ER precautions given. - Ambulatory referral to Cardiology  7. Gastroesophageal reflux disease without esophagitis Uncontrolled. Increasing PPI to BID  8. Mixed hyperlipidemia - rosuvastatin (CRESTOR) 40 MG tablet; Take 1 tablet (40 mg total) by mouth daily.  Letter given to excuse from work due to risk of complications if infected with covid 19 was given.  Other orders - triamterene-hydrochlorothiazide (MAXZIDE-25) 37.5-25 MG tablet; Take 1 tablet by mouth daily. - pioglitazone (ACTOS) 30 MG tablet; Take 1 tablet (30 mg total) by mouth daily. - glipiZIDE (GLUCOTROL) 5 MG tablet; Take 1 tablet (5 mg total) by mouth 2 (two) times daily before a meal. - esomeprazole (NEXIUM) 20 MG capsule; Take 1 capsule (20 mg total) by mouth 2 (two) times daily before a meal. Reported on 03/07/2015 - blood glucose meter kit and supplies; Per insurance preference. Check glucose once a day, alternate between fasting and 2 hours after dinner.  Return in about 4 weeks (around 08/15/2018).    [name], MD Primary [facility] at [facility] [place], [place] [address] Ph.  [phone] Fax [phone]

## 2018-07-18 NOTE — Progress Notes (Signed)
Presents today for [employer] Visit   Date of last exam: 09/30/2017  Interpreter used for this visit? No  I connected with  Erin Benitez on 07/18/18 by a  telephone verified that I am speaking with the correct person using two identifiers.   I discussed the limitations of evaluation and management by telemedicine. The patient expressed understanding and agreed to proceed.   Patient Care Team: Donzetta Kohut as PCP - General (Physician Assistant)   Other items to address today:   Discussed Eye/Dental exam (patient will schedule) Discussed mammogram (patient will schedule)  Patient has a follow up with Dr. Pamella Pert 07/18/2018 1:40 Will discuss medication stopped taken Metformin last sugar was 257    Other Screening: Last screening for diabetes: 06/03/2017 Last lipid screening: 06/03/2017  ADVANCE DIRECTIVES: Discussed: yes On File: no Materials Provided: yes (mailed)  Immunization status:  Immunization History  Administered Date(s) Administered  . Influenza,inj,Quad PF,6+ Mos 03/09/2015  . PPD Test 04/01/2013, 09/30/2017  . Pneumococcal Conjugate-13 07/08/2017  . Td 07/08/2017     Health Maintenance Due  Topic Date Due  . MAMMOGRAM  11/02/2000  . DEXA SCAN  11/03/2015  . OPHTHALMOLOGY EXAM  01/12/2018  . HEMOGLOBIN A1C  03/31/2018  . FOOT EXAM  07/09/2018  . URINE MICROALBUMIN  07/09/2018  . PNA vac Low Risk Adult (2 of 2 - PPSV23) 07/09/2018     Functional Status Survey: Is the patient deaf or have difficulty hearing?: No Does the patient have difficulty concentrating, remembering, or making decisions?: No Does the patient have difficulty walking or climbing stairs?: Yes(shortness of breath) Does the patient have difficulty dressing or bathing?: No Does the patient have difficulty doing errands alone such as visiting a doctor's office or shopping?: No   6CIT Screen 07/18/2018 09/30/2017  What Year? 0 points 0 points  What  month? 0 points 0 points  What time? 0 points 0 points  Count back from 20 0 points 0 points  Months in reverse 0 points 0 points  Repeat phrase 0 points 0 points  Total Score 0 0        Clinical Support from 07/18/2018 in [facility] at [place]  AUDIT-C Score  0       Home Environment:    Lives with daughter in one story home  Can climb stairs but does get short of breath   No scattered rugs Yes Grab bars Adequate lighting   Patient Active Problem List   Diagnosis Date Noted  . Type 2 diabetes mellitus without complication, without long-term current use of insulin ([place]) 09/06/2017  . Diverticulosis of colon without hemorrhage 03/09/2015  . Atypical chest pain 03/09/2015  . Hyperlipidemia 03/09/2015  . Gastroesophageal reflux disease without esophagitis 03/09/2015  . HTN (hypertension) 04/01/2013  . Asthma 04/01/2013     Past Medical History:  Diagnosis Date  . Allergy   . Asthma   . Bronchitis   . Cataract   . DM2 (diabetes mellitus, type 2) ([place])   . GERD (gastroesophageal reflux disease)   . Heart murmur   . Hypertension      Past Surgical History:  Procedure Laterality Date  . CESAREAN SECTION    . COLONOSCOPY    . TONSILLECTOMY       Family History  Problem Relation Age of Onset  . Cancer Mother        ovarian cancer  . Heart disease Mother   . Liver disease Mother   .  Hyperlipidemia Mother   . Stroke Mother   . Cancer Father        lung cancer  . Cancer Sister   . Hyperlipidemia Sister   . Cancer Sister   . Hyperlipidemia Sister   . Diabetes Maternal Aunt   . Colon cancer Maternal Aunt        x2 aunts  . Colon cancer Maternal Uncle        2 uncles  . Colon cancer Paternal Grandfather   . Esophageal cancer Neg Hx   . Rectal cancer Neg Hx   . Stomach cancer Neg Hx      Social History   Socioeconomic History  . Marital status: Single    Spouse name: Not on file  . Number of children: 1  . Years of education: Not on file  .  Highest education level: Not on file  Occupational History  . Occupation: Tourist information centre manager  Social Needs  . Financial resource strain: Not on file  . Food insecurity    Worry: Not on file    Inability: Not on file  . Transportation needs    Medical: Not on file    Non-medical: Not on file  Tobacco Use  . Smoking status: Never Smoker  . Smokeless tobacco: Never Used  Substance and Sexual Activity  . Alcohol use: No  . Drug use: No  . Sexual activity: Yes  Lifestyle  . Physical activity    Days per week: Not on file    Minutes per session: Not on file  . Stress: Not on file  Relationships  . Social Herbalist on phone: Not on file    Gets together: Not on file    Attends religious service: Not on file    Active member of club or organization: Not on file    Attends meetings of clubs or organizations: Not on file    Relationship status: Not on file  . Intimate partner violence    Fear of current or ex partner: Not on file    Emotionally abused: Not on file    Physically abused: Not on file    Forced sexual activity: Not on file  Other Topics Concern  . Not on file  Social History Narrative   She reports she is single. She has 1 daughter. She is an Tourist information centre manager. 2 caffeinated beverages daily.     Allergies  Allergen Reactions  . Lisinopril Swelling  . Peanut-Containing Drug Products Anaphylaxis, Hives, Swelling and Other (See Comments)    Pistachio's - swelling of the throat     . Tramadol Nausea And Vomiting    Headache & dizziness with n&v     Prior to Admission medications   Medication Sig Start Date Benitez Date Taking? Authorizing Provider  amLODipine (NORVASC) 10 MG tablet Take 0.5 tablets (5 mg total) by mouth daily. 09/30/17  Yes Timmothy Euler, [place] D, PA-C  blood glucose meter kit and supplies KIT Dispense based on patient and insurance preference. Use up to four times daily as directed. (FOR ICD-9 250.00, 250.01). 07/08/17  Yes Timmothy Euler [place] D, PA-C  Blood  Pressure Monitor KIT 1 Units by Does not apply route daily. 06/03/17  Yes Timmothy Euler, [place] D, PA-C  esomeprazole (NEXIUM) 20 MG capsule Take 40 mg by mouth daily at 12 noon. Reported on 03/07/2015   Yes [provider]  fluticasone (FLONASE) 50 MCG/ACT nasal spray Place 1 spray into both nostrils 2 (two) times daily. 08/21/17  Yes Pamella Pert, Benay Spice  M, MD  rosuvastatin (CRESTOR) 10 MG tablet Take 1 tablet (10 mg total) by mouth daily. 06/03/17  Yes Timmothy Euler, [place] D, PA-C  albuterol (VENTOLIN HFA) 108 (90 Base) MCG/ACT inhaler Inhale 2 puffs into the lungs every 6 (six) hours as needed for wheezing or shortness of breath. Patient not taking: Reported on 07/18/2018 05/28/18   Sharion Balloon, FNP  benzonatate (TESSALON PERLES) 100 MG capsule Take 1 capsule (100 mg total) by mouth 3 (three) times daily as needed. Patient not taking: Reported on 07/18/2018 05/28/18   Sharion Balloon, FNP  metFORMIN (GLUCOPHAGE-XR) 500 MG 24 hr tablet Take 4 tablets (2,000 mg total) by mouth daily with breakfast. Patient not taking: Reported on 07/18/2018 09/30/17   Leonie Douglas, PA-C  promethazine-dextromethorphan (PROMETHAZINE-DM) 6.25-15 MG/5ML syrup Take 5 mLs by mouth 3 (three) times daily as needed for cough. Patient not taking: Reported on 07/18/2018 05/28/18   Sharion Balloon, FNP     Depression screen [facility] 2/9 07/18/2018 09/30/2017 08/21/2017 07/08/2017 06/03/2017  Decreased Interest 0 0 0 0 0  Down, Depressed, Hopeless 0 0 0 0 0  PHQ - 2 Score 0 0 0 0 0     Fall Risk  07/18/2018 09/30/2017 08/21/2017 07/08/2017 06/03/2017  Falls in the past year? 0 No No No No  Number falls in past yr: 0 - - - -  Injury with Fall? 0 - - - -  Follow up Falls evaluation completed;Education provided;Falls prevention discussed - - - -      PHYSICAL EXAM: BP 114/80 Comment: taken from most recent visit  Ht _0  (1.626 m)   Wt 140 lb (63.5 kg)   BMI 24.03 kg/m    Wt Readings from Last 3 Encounters:  07/18/18 140 lb (63.5  kg)  09/30/17 140 lb 9.6 oz (63.8 kg)  08/21/17 138 lb 9.6 oz (62.9 kg)     No exam data present    Physical Exam   Education/Counseling provided regarding diet and exercise, prevention of chronic diseases, smoking/tobacco cessation, if applicable, and reviewed "Covered Medicare Preventive Services."

## 2018-07-18 NOTE — Patient Instructions (Signed)
° ° ° °  If you have lab work done today you will be contacted with your lab results within the next 2 weeks.  If you have not heard from us then please contact us. The fastest way to get your results is to register for My Chart. ° ° °IF you received an x-ray today, you will receive an invoice from [facility]. Please contact [facility] at [phone] with questions or concerns regarding your invoice.  ° °IF you received labwork today, you will receive an invoice from [facility]. Please contact [facility] at [phone] with questions or concerns regarding your invoice.  ° °Our billing staff will not be able to assist you with questions regarding bills from these companies. ° °You will be contacted with the lab results as soon as they are available. The fastest way to get your results is to activate your My Chart account. Instructions are located on the last page of this paperwork. If you have not heard from us regarding the results in 2 weeks, please contact this office. °  ° ° ° °

## 2018-07-18 NOTE — Patient Instructions (Addendum)
Thank you for taking time to come for your Medicare Wellness Visit. I appreciate your ongoing commitment to your health goals. Please review the following plan we discussed and let me know if I can assist you in the future.  Erin Pribyl LPN   Preventive Care 68 Years and Older, Female Preventive care refers to lifestyle choices and visits with your health care provider that can promote health and wellness. What does preventive care include?  A yearly physical exam. This is also called an annual well check.  Dental exams once or twice a year.  Routine eye exams. Ask your health care provider how often you should have your eyes checked.  Personal lifestyle choices, including: ? Daily care of your teeth and gums. ? Regular physical activity. ? Eating a healthy diet. ? Avoiding tobacco and drug use. ? Limiting alcohol use. ? Practicing safe sex. ? Taking low-dose aspirin every day. ? Taking vitamin and mineral supplements as recommended by your health care provider. What happens during an annual well check? The services and screenings done by your health care provider during your annual well check will depend on your age, overall health, lifestyle risk factors, and family history of disease. Counseling Your health care provider may ask you questions about your:  Alcohol use.  Tobacco use.  Drug use.  Emotional well-being.  Home and relationship well-being.  Sexual activity.  Eating habits.  History of falls.  Memory and ability to understand (cognition).  Work and work environment.  Reproductive health.  Screening You may have the following tests or measurements:  Height, weight, and BMI.  Blood pressure.  Lipid and cholesterol levels. These may be checked every 5 years, or more frequently if you are over 50 years old.  Skin check.  Lung cancer screening. You may have this screening every year starting at age 55 if you have a 30-pack-year history of smoking  and currently smoke or have quit within the past 15 years.  Colorectal cancer screening. All adults should have this screening starting at age 50 and continuing until age 75. You will have tests every 1-10 years, depending on your results and the type of screening test. People at increased risk should start screening at an earlier age. Screening tests may include: ? Guaiac-based fecal occult blood testing. ? Fecal immunochemical test (FIT). ? Stool DNA test. ? Virtual colonoscopy. ? Sigmoidoscopy. During this test, a flexible tube with a tiny camera (sigmoidoscope) is used to examine your rectum and lower colon. The sigmoidoscope is inserted through your anus into your rectum and lower colon. ? Colonoscopy. During this test, a long, thin, flexible tube with a tiny camera (colonoscope) is used to examine your entire colon and rectum.  Hepatitis C blood test.  Hepatitis B blood test.  Sexually transmitted disease (STD) testing.  Diabetes screening. This is done by checking your blood sugar (glucose) after you have not eaten for a while (fasting). You may have this done every 1-3 years.  Bone density scan. This is done to screen for osteoporosis. You may have this done starting at age 68.  Mammogram. This may be done every 1-2 years. Talk to your health care provider about how often you should have regular mammograms. Talk with your health care provider about your test results, treatment options, and if necessary, the need for more tests. Vaccines Your health care provider may recommend certain vaccines, such as:  Influenza vaccine. This is recommended every year.  Tetanus, diphtheria, and acellular pertussis (Tdap,   Td) vaccine. You may need a Td booster every 10 years.  Varicella vaccine. You may need this if you have not been vaccinated.  Zoster vaccine. You may need this after age 60.  Measles, mumps, and rubella (MMR) vaccine. You may need at least one dose of MMR if you were born  in 1957 or later. You may also need a second dose.  Pneumococcal 13-valent conjugate (PCV13) vaccine. One dose is recommended after age 68.  Pneumococcal polysaccharide (PPSV23) vaccine. One dose is recommended after age 68.  Meningococcal vaccine. You may need this if you have certain conditions.  Hepatitis A vaccine. You may need this if you have certain conditions or if you travel or work in places where you may be exposed to hepatitis A.  Hepatitis B vaccine. You may need this if you have certain conditions or if you travel or work in places where you may be exposed to hepatitis B.  Haemophilus influenzae type b (Hib) vaccine. You may need this if you have certain conditions. Talk to your health care provider about which screenings and vaccines you need and how often you need them. This information is not intended to replace advice given to you by your health care provider. Make sure you discuss any questions you have with your health care provider. Document Released: 02/13/2015 Document Revised: 03/09/2017 Document Reviewed: 11/18/2014 Elsevier Interactive Patient Education  2019 [employer]vier Inc.  

## 2018-07-19 LAB — COMPREHENSIVE METABOLIC PANEL
ALT: 22 IU/L (ref 0–32)
AST: 18 IU/L (ref 0–40)
Albumin/Globulin Ratio: 1.8 (ref 1.2–2.2)
Albumin: 4.9 g/dL — ABNORMAL HIGH (ref 3.8–4.8)
Alkaline Phosphatase: 110 IU/L (ref 39–117)
BUN/Creatinine Ratio: 18 (ref 12–28)
BUN: 14 mg/dL (ref 8–27)
Bilirubin Total: 0.4 mg/dL (ref 0.0–1.2)
CO2: 23 mmol/L (ref 20–29)
Calcium: 10 mg/dL (ref 8.7–10.3)
Chloride: 96 mmol/L (ref 96–106)
Creatinine, Ser: 0.76 mg/dL (ref 0.57–1.00)
GFR calc Af Amer: 94 mL/min/{1.73_m2} (ref >59)
GFR calc non Af Amer: 81 mL/min/{1.73_m2} (ref >59)
Globulin, Total: 2.7 g/dL (ref 1.5–4.5)
Glucose: 369 mg/dL — ABNORMAL HIGH (ref 65–99)
Potassium: 5.1 mmol/L (ref 3.5–5.2)
Sodium: 136 mmol/L (ref 134–144)
Total Protein: 7.6 g/dL (ref 6.0–8.5)

## 2018-07-19 LAB — CBC
Hematocrit: 41.9 % (ref 34.0–46.6)
Hemoglobin: 14.3 g/dL (ref 11.1–15.9)
MCH: 29.1 pg (ref 26.6–33.0)
MCHC: 34.1 g/dL (ref 31.5–35.7)
MCV: 85 fL (ref 79–97)
Platelets: 241 10*3/uL (ref 150–450)
RBC: 4.92 x10E6/uL (ref 3.77–5.28)
RDW: 13.1 % (ref 11.7–15.4)
WBC: 5.3 10*3/uL (ref 3.4–10.8)

## 2018-07-19 LAB — LIPID PANEL
Chol/HDL Ratio: 5 ratio — ABNORMAL HIGH (ref 0.0–4.4)
Cholesterol, Total: 209 mg/dL — ABNORMAL HIGH (ref 100–199)
HDL: 42 mg/dL (ref >39)
LDL Calculated: 118 mg/dL — ABNORMAL HIGH (ref 0–99)
Triglycerides: 246 mg/dL — ABNORMAL HIGH (ref 0–149)
VLDL Cholesterol Cal: 49 mg/dL — ABNORMAL HIGH (ref 5–40)

## 2018-07-19 LAB — MICROALBUMIN / CREATININE URINE RATIO
Creatinine, Urine: 63.1 mg/dL
Microalb/Creat Ratio: 55 mg/g creat — ABNORMAL HIGH (ref 0–29)
Microalbumin, Urine: 34.4 ug/mL

## 2018-07-19 LAB — TSH: TSH: 1.42 u[IU]/mL (ref 0.450–4.500)

## 2018-07-24 ENCOUNTER — Ambulatory Visit
Admission: RE | Admit: 2018-07-24 | Discharge: 2018-07-24 | Disposition: A | Discharge status: Home / Self Care | Payer: Medicare Other | Source: Ambulatory Visit | Attending: Family Medicine | Admitting: Family Medicine

## 2018-07-24 ENCOUNTER — Other Ambulatory Visit: Payer: Self-pay

## 2018-07-24 ENCOUNTER — Telehealth: Payer: Self-pay | Admitting: Family Medicine

## 2018-07-24 DIAGNOSIS — R0602 Shortness of breath: Secondary | ICD-10-CM | POA: Diagnosis not present

## 2018-07-24 MED ORDER — PANTOPRAZOLE SODIUM 20 MG PO TBEC: 20.0000 mg | DELAYED_RELEASE_TABLET | Freq: Two times a day (BID) | ORAL | 5 refills | Status: DC

## 2018-07-24 NOTE — Telephone Encounter (Signed)
The medication Esomeprazole Magnesium was denied by the insurance company. She has to try and fail two of the following alternatives: omeprazole caps, pantoprazole tabs, or lansoprazole caps and atleast a 30 day supply. The denial letter will be in providers box at nurses station. She has tried omeprazole tablets in the past, not sure of the outcome.

## 2018-07-24 NOTE — Telephone Encounter (Signed)
Please let patient know that I sent in a rx for pantoprazole. thanks

## 2018-07-24 NOTE — Telephone Encounter (Signed)
Called and informed pt that RX has been sent to pharmacy.

## 2018-08-09 ENCOUNTER — Encounter: Payer: Self-pay | Admitting: Family Medicine

## 2018-08-19 NOTE — Progress Notes (Signed)
Primary Physician:  [name], MD   Patient ID: Erin Benitez, female    DOB: [birth year], 68 y.o.   MRN: [id number]  Subjective:    Chief Complaint  Patient presents with  . Hypertension  . Chest Pain  . Shortness of Breath  . New Patient (Initial Visit)    HPI: Erin Benitez  is a 68 y.o. female  with hypertension, type 2 diabetes, GERD, referred to [place] for evaluation of shortness of breath associated with chest tightness.    She notices chest pain and shortness of breathwith activities such as carrying her trash or going up steps. She also has episodes with lying down at night. Chest pain is relieved by resting or by taking Tums. She does have a history of GERD. No PND or orthopnea. No leg swelling or palpitations. She was previously evaluated by [place] in May 2019 for dyspnea on exertion.  Exercise nuclear stress testing and echocardiogram was recommended; however, was not performed and patient was lost to follow-up. She has just started Protonix twice a day that she states has helped some.  Hyperlipidemia was felt to be likely familial hyperlipidemia.  Crestor was increased to 40 mg daily. Diabetes is uncontrolled, but she has made significant diet changes. Has known heart murmur for many years. Denies any previous cardiac workup.   She does walk 1 mile a day that she tolerates fairly well. She does notice tiredness if she walks more than 1 mile per day. She does report that her cousin had a MI at age 51. No former tobacco use.   Past Medical History:  Diagnosis Date  . Allergy   . Asthma   . Bronchitis   . Cataract   . DM2 (diabetes mellitus, type 2) ([place])   . GERD (gastroesophageal reflux disease)   . Heart murmur   . Hypertension     Past Surgical History:  Procedure Laterality Date  . CESAREAN SECTION    . COLONOSCOPY    . TONSILLECTOMY      Social History   Socioeconomic History  . Marital status: Single    Spouse name: Not on file  . Number of children: 1  .  Years of education: Not on file  . Highest education level: Not on file  Occupational History  . Occupation: Tourist information centre manager  Social Needs  . Financial resource strain: Not on file  . Food insecurity    Worry: Not on file    Inability: Not on file  . Transportation needs    Medical: Not on file    Non-medical: Not on file  Tobacco Use  . Smoking status: Never Smoker  . Smokeless tobacco: Never Used  Substance and Sexual Activity  . Alcohol use: No  . Drug use: No  . Sexual activity: Yes  Lifestyle  . Physical activity    Days per week: Not on file    Minutes per session: Not on file  . Stress: Not on file  Relationships  . Social Herbalist on phone: Not on file    Gets together: Not on file    Attends religious service: Not on file    Active member of club or organization: Not on file    Attends meetings of clubs or organizations: Not on file    Relationship status: Not on file  . Intimate partner violence    Fear of current or ex partner: Not on file    Emotionally abused: Not on file  Physically abused: Not on file    Forced sexual activity: Not on file  Other Topics Concern  . Not on file  Social History Narrative   She reports she is single. She has 1 daughter. She is an Tourist information centre manager. 2 caffeinated beverages daily.    Review of Systems  Constitution: Negative for decreased appetite, malaise/fatigue, weight gain and weight loss.  Eyes: Negative for visual disturbance.  Cardiovascular: Positive for chest pain and dyspnea on exertion. Negative for claudication, leg swelling, orthopnea, palpitations and syncope.  Respiratory: Positive for shortness of breath. Negative for hemoptysis and wheezing.   Endocrine: Negative for cold intolerance and heat intolerance.  Hematologic/Lymphatic: Does not bruise/bleed easily.  Skin: Negative for nail changes.  Musculoskeletal: Negative for muscle weakness and myalgias.  Gastrointestinal: Negative for abdominal pain, change in  bowel habit, nausea and vomiting.  Neurological: Negative for difficulty with concentration, dizziness, focal weakness and headaches.  Psychiatric/Behavioral: Negative for altered mental status and suicidal ideas.  All other systems reviewed and are negative.     Objective:  Blood pressure (!) 142/80, pulse 84, temperature 98.2 F (36.8 C), height 5' 4"  (1.626 m), weight 141 lb 4.8 oz (64.1 kg), SpO2 99 %. Body mass index is 24.25 kg/m.    Physical Exam  Constitutional: She is oriented to person, place, and time. Vital signs are normal. She appears well-developed and well-nourished.  HENT:  Head: Normocephalic and atraumatic.  Neck: Normal range of motion.  Cardiovascular: Normal rate, regular rhythm and intact distal pulses.  Murmur heard.  Harsh early systolic murmur is present with a grade of 2/6 at the upper right sternal border and apex. Pulmonary/Chest: Effort normal and breath sounds normal. No accessory muscle usage. No respiratory distress.  Abdominal: Soft. Bowel sounds are normal.  Musculoskeletal: Normal range of motion.  Neurological: She is alert and oriented to person, place, and time.  Skin: Skin is warm and dry.  Vitals reviewed.  Radiology: No results found.  Laboratory examination:    CMP Latest Ref Rng & Units 07/18/2018 09/30/2017 06/03/2017  Glucose 65 - 99 mg/dL 369(H) 257(H) 168(H)  BUN 8 - 27 mg/dL 14 12 13   Creatinine 0.57 - 1.00 mg/dL 0.76 0.73 0.68  Sodium 134 - 144 mmol/L 136 138 142  Potassium 3.5 - 5.2 mmol/L 5.1 4.9 4.6  Chloride 96 - 106 mmol/L 96 98 101  CO2 20 - 29 mmol/L 23 18(L) 26  Calcium 8.7 - 10.3 mg/dL 10.0 9.7 10.0  Total Protein 6.0 - 8.5 g/dL 7.6 7.7 7.5  Total Bilirubin 0.0 - 1.2 mg/dL 0.4 0.4 0.6  Alkaline Phos 39 - 117 IU/L 110 112 115  AST 0 - 40 IU/L 18 17 17   ALT 0 - 32 IU/L 22 18 17    CBC Latest Ref Rng & Units 07/18/2018 06/03/2017 03/07/2015  WBC 3.4 - 10.8 x10E3/uL 5.3 4.0 4.4(A)  Hemoglobin 11.1 - 15.9 g/dL 14.3 13.3 13.3   Hematocrit 34.0 - 46.6 % 41.9 39.6 38.1  Platelets 150 - 450 x10E3/uL 241 271 -   Lipid Panel     Component Value Date/Time   CHOL 209 (H) 07/18/2018 1413   TRIG 246 (H) 07/18/2018 1413   HDL 42 07/18/2018 1413   CHOLHDL 5.0 (H) 07/18/2018 1413   CHOLHDL 7.0 (H) 03/07/2015 1016   VLDL 37 (H) 03/07/2015 1016   LDLCALC 118 (H) 07/18/2018 1413   HEMOGLOBIN A1C Lab Results  Component Value Date   HGBA1C 12.0 (A) 07/18/2018   TSH Recent Labs  07/18/18 1413  TSH 1.420    PRN Meds:. There are no discontinued medications. Current Meds  Medication Sig  . albuterol (VENTOLIN HFA) 108 (90 Base) MCG/ACT inhaler Inhale 2 puffs into the lungs every 6 (six) hours as needed for wheezing or shortness of breath.  Marland Kitchen amLODipine (NORVASC) 10 MG tablet Take 1 tablet (10 mg total) by mouth daily.  . blood glucose meter kit and supplies Per insurance preference. Check glucose once a day, alternate between fasting and 2 hours after dinner.  . Blood Pressure Monitor KIT 1 Units by Does not apply route daily.  . fluticasone (FLONASE) 50 MCG/ACT nasal spray Place 1 spray into both nostrils 2 (two) times daily.  Marland Kitchen glipiZIDE (GLUCOTROL) 5 MG tablet Take 1 tablet (5 mg total) by mouth 2 (two) times daily before a meal.  . pantoprazole (PROTONIX) 20 MG tablet Take 1 tablet (20 mg total) by mouth 2 (two) times daily before a meal.  . pioglitazone (ACTOS) 30 MG tablet Take 1 tablet (30 mg total) by mouth daily.  . rosuvastatin (CRESTOR) 40 MG tablet Take 1 tablet (40 mg total) by mouth daily.  Marland Kitchen triamterene-hydrochlorothiazide (MAXZIDE-25) 37.5-25 MG tablet Take 1 tablet by mouth daily.    Cardiac Studies:     Assessment:     ICD-10-CM   1. Atypical chest pain  R07.89 PCV MYOCARDIAL PERFUSION WITH LEXISCAN    PCV ECHOCARDIOGRAM COMPLETE  2. Shortness of breath  R06.02 PCV MYOCARDIAL PERFUSION WITH LEXISCAN    PCV ECHOCARDIOGRAM COMPLETE  3. Essential hypertension  I10 EKG 12-Lead  4.  Gastroesophageal reflux disease without esophagitis  K21.9   5. Systolic murmur  F81.0   6. Uncontrolled type 2 diabetes mellitus without complication, without long-term current use of insulin (HCC)  E11.65   7. Familial hypercholesterolemia  E78.01     EKG 08/20/2018: Normal sinus rhythm at 80 bpm, normal axis, Q wave in ifnferior lead, most prominent in lead 3. Early R wave progression, cannot exclude posterior infarct old. No evidence of ischemia.   Recommendations:   Although some of patient symptoms are concerning for angina, feel that the majority of her symptoms are atypical and likely related to GERD; however, in view of her risk factors, CAD should be excluded.  We will schedule for Lexiscan nuclear stress testing.  She does have systolic murmur on exam that is not new for her, but given her symptoms, will obtain echocardiogram to exclude any structural abnormalities.  No clinical evidence of heart failure.  EKG is essentially normal, although has Q wave most prominent in lead III and slight early R wave progression.  I will start her on metoprolol succinate 25 mg daily both for better blood pressure control and anti-anginal therapy.  She has been taking aspirin, in view of her worsening GERD symptoms, I have asked her to hold her aspirin to see if she has any improvement in her symptoms as this could also be contributing to her worsening GERD.  She will need to resume aspirin if stress testing is remarkably abnormal.  She has made significant diet changes to help with her diabetes and is to follow-up with her PCP tomorrow regarding this.  Lipids have significantly improved since she was last seen by [place] in 2019, would recommend continuing with Crestor.  I will see her back after the test for further recommendations and reevaluation.   *I have discussed this case with Dr. Virgina Jock and he personally examined the patient and participated in formulating the  plan.*    Miquel Dunn,  MSN, APRN, FNP-C [facility]. [place] Office: [phone] Fax: [phone]

## 2018-08-20 ENCOUNTER — Encounter: Payer: Self-pay | Admitting: Cardiology

## 2018-08-20 ENCOUNTER — Other Ambulatory Visit: Payer: Self-pay

## 2018-08-20 ENCOUNTER — Ambulatory Visit (INDEPENDENT_AMBULATORY_CARE_PROVIDER_SITE_OTHER): Payer: Medicare Other | Admitting: Cardiology

## 2018-08-20 VITALS — BP 142/80 | HR 84 | Temp 98.2°F | Ht 64.0 in | Wt 141.3 lb

## 2018-08-20 DIAGNOSIS — R011 Cardiac murmur, unspecified: Secondary | ICD-10-CM

## 2018-08-20 DIAGNOSIS — R0602 Shortness of breath: Secondary | ICD-10-CM

## 2018-08-20 DIAGNOSIS — E7801 Familial hypercholesterolemia: Secondary | ICD-10-CM | POA: Diagnosis not present

## 2018-08-20 DIAGNOSIS — I10 Essential (primary) hypertension: Secondary | ICD-10-CM | POA: Diagnosis not present

## 2018-08-20 DIAGNOSIS — R0789 Other chest pain: Secondary | ICD-10-CM

## 2018-08-20 DIAGNOSIS — IMO0001 Reserved for inherently not codable concepts without codable children: Secondary | ICD-10-CM

## 2018-08-20 DIAGNOSIS — K219 Gastro-esophageal reflux disease without esophagitis: Secondary | ICD-10-CM | POA: Diagnosis not present

## 2018-08-20 DIAGNOSIS — E1165 Type 2 diabetes mellitus with hyperglycemia: Secondary | ICD-10-CM | POA: Diagnosis not present

## 2018-08-20 DIAGNOSIS — E78019 Familial hypercholesterolemia, unspecified: Secondary | ICD-10-CM

## 2018-08-20 MED ORDER — METOPROLOL SUCCINATE ER 25 MG PO TB24: 25.0000 mg | ORAL_TABLET | Freq: Every day | ORAL | 1 refills | Status: DC

## 2018-08-21 ENCOUNTER — Ambulatory Visit (INDEPENDENT_AMBULATORY_CARE_PROVIDER_SITE_OTHER): Payer: Medicare Other | Admitting: Family Medicine

## 2018-08-21 ENCOUNTER — Other Ambulatory Visit: Payer: Self-pay

## 2018-08-21 ENCOUNTER — Encounter: Payer: Self-pay | Admitting: Family Medicine

## 2018-08-21 VITALS — BP 122/74 | HR 76 | Temp 98.3°F | Ht 64.0 in | Wt 141.0 lb

## 2018-08-21 DIAGNOSIS — Z23 Encounter for immunization: Secondary | ICD-10-CM

## 2018-08-21 DIAGNOSIS — E119 Type 2 diabetes mellitus without complications: Secondary | ICD-10-CM

## 2018-08-21 DIAGNOSIS — E785 Hyperlipidemia, unspecified: Secondary | ICD-10-CM | POA: Diagnosis not present

## 2018-08-21 DIAGNOSIS — I10 Essential (primary) hypertension: Secondary | ICD-10-CM

## 2018-08-21 LAB — LIPID PANEL
Chol/HDL Ratio: 5.8 ratio — ABNORMAL HIGH (ref 0.0–4.4)
Cholesterol, Total: 273 mg/dL — ABNORMAL HIGH (ref 100–199)
HDL: 47 mg/dL (ref >39)
LDL Calculated: 181 mg/dL — ABNORMAL HIGH (ref 0–99)
Triglycerides: 223 mg/dL — ABNORMAL HIGH (ref 0–149)
VLDL Cholesterol Cal: 45 mg/dL — ABNORMAL HIGH (ref 5–40)

## 2018-08-21 LAB — COMPREHENSIVE METABOLIC PANEL
ALT: 17 IU/L (ref 0–32)
AST: 15 IU/L (ref 0–40)
Albumin/Globulin Ratio: 1.6 (ref 1.2–2.2)
Albumin: 4.9 g/dL — ABNORMAL HIGH (ref 3.8–4.8)
Alkaline Phosphatase: 85 IU/L (ref 39–117)
BUN/Creatinine Ratio: 16 (ref 12–28)
BUN: 18 mg/dL (ref 8–27)
Bilirubin Total: 0.3 mg/dL (ref 0.0–1.2)
CO2: 25 mmol/L (ref 20–29)
Calcium: 10.1 mg/dL (ref 8.7–10.3)
Chloride: 97 mmol/L (ref 96–106)
Creatinine, Ser: 1.11 mg/dL — ABNORMAL HIGH (ref 0.57–1.00)
GFR calc Af Amer: 59 mL/min/{1.73_m2} — ABNORMAL LOW (ref >59)
GFR calc non Af Amer: 52 mL/min/{1.73_m2} — ABNORMAL LOW (ref >59)
Globulin, Total: 3 g/dL (ref 1.5–4.5)
Glucose: 185 mg/dL — ABNORMAL HIGH (ref 65–99)
Potassium: 5.1 mmol/L (ref 3.5–5.2)
Sodium: 139 mmol/L (ref 134–144)
Total Protein: 7.9 g/dL (ref 6.0–8.5)

## 2018-08-21 NOTE — Patient Instructions (Signed)
Carbohydrate Counting for Diabetes Mellitus, Adult  Carbohydrate counting is a method of keeping track of how many carbohydrates you eat. Eating carbohydrates naturally increases the amount of sugar (glucose) in the blood. Counting how many carbohydrates you eat helps keep your blood glucose within normal limits, which helps you manage your diabetes (diabetes mellitus). It is important to know how many carbohydrates you can safely have in each meal. This is different for every person. A diet and nutrition specialist (registered dietitian) can help you make a meal plan and calculate how many carbohydrates you should have at each meal and snack. Carbohydrates are found in the following foods:  Grains, such as breads and cereals.  Dried beans and soy products.  Starchy vegetables, such as potatoes, peas, and corn.  Fruit and fruit juices.  Milk and yogurt.  Sweets and snack foods, such as cake, cookies, candy, chips, and soft drinks. How do I count carbohydrates? There are two ways to count carbohydrates in food. You can use either of the methods or a combination of both. Reading "Nutrition Facts" on packaged food The "Nutrition Facts" list is included on the labels of almost all packaged foods and beverages in the [place] It includes:  The serving size.  Information about nutrients in each serving, including the grams (g) of carbohydrate per serving. To use the "Nutrition Facts":  Decide how many servings you will have.  Multiply the number of servings by the number of carbohydrates per serving.  The resulting number is the total amount of carbohydrates that you will be having. Learning standard serving sizes of other foods When you eat carbohydrate foods that are not packaged or do not include "Nutrition Facts" on the label, you need to measure the servings in order to count the amount of carbohydrates:  Measure the foods that you will eat with a food scale or measuring cup, if needed.   Decide how many standard-size servings you will eat.  Multiply the number of servings by 15. Most carbohydrate-rich foods have about 15 g of carbohydrates per serving. ? For example, if you eat 8 oz (170 g) of strawberries, you will have eaten 2 servings and 30 g of carbohydrates (2 servings x 15 g = 30 g).  For foods that have more than one food mixed, such as soups and casseroles, you must count the carbohydrates in each food that is included. The following list contains standard serving sizes of common carbohydrate-rich foods. Each of these servings has about 15 g of carbohydrates:   hamburger bun or  English muffin.   oz (15 mL) syrup.   oz (14 g) jelly.  1 slice of bread.  1 six-inch tortilla.  3 oz (85 g) cooked rice or pasta.  4 oz (113 g) cooked dried beans.  4 oz (113 g) starchy vegetable, such as peas, corn, or potatoes.  4 oz (113 g) hot cereal.  4 oz (113 g) mashed potatoes or  of a large baked potato.  4 oz (113 g) canned or frozen fruit.  4 oz (120 mL) fruit juice.  4-6 crackers.  6 chicken nuggets.  6 oz (170 g) unsweetened dry cereal.  6 oz (170 g) plain fat-free yogurt or yogurt sweetened with artificial sweeteners.  8 oz (240 mL) milk.  8 oz (170 g) fresh fruit or one small piece of fruit.  24 oz (680 g) popped popcorn. Example of carbohydrate counting Sample meal  3 oz (85 g) chicken breast.  6 oz (170 g)   brown rice.  4 oz (113 g) corn.  8 oz (240 mL) milk.  8 oz (170 g) strawberries with sugar-free whipped topping. Carbohydrate calculation 1. Identify the foods that contain carbohydrates: ? Rice. ? Corn. ? Milk. ? Strawberries. 2. Calculate how many servings you have of each food: ? 2 servings rice. ? 1 serving corn. ? 1 serving milk. ? 1 serving strawberries. 3. Multiply each number of servings by 15 g: ? 2 servings rice x 15 g = 30 g. ? 1 serving corn x 15 g = 15 g. ? 1 serving milk x 15 g = 15 g. ? 1 serving  strawberries x 15 g = 15 g. 4. Add together all of the amounts to find the total grams of carbohydrates eaten: ? 30 g + 15 g + 15 g + 15 g = 75 g of carbohydrates total. Summary  Carbohydrate counting is a method of keeping track of how many carbohydrates you eat.  Eating carbohydrates naturally increases the amount of sugar (glucose) in the blood.  Counting how many carbohydrates you eat helps keep your blood glucose within normal limits, which helps you manage your diabetes.  A diet and nutrition specialist (registered dietitian) can help you make a meal plan and calculate how many carbohydrates you should have at each meal and snack. This information is not intended to replace advice given to you by your health care provider. Make sure you discuss any questions you have with your health care provider. Document Released: 01/17/2005 Document Revised: 08/11/2016 Document Reviewed: 07/01/2015 Elsevier Patient Education  2020 [employer] Inc.  

## 2018-08-21 NOTE — Progress Notes (Signed)
7/21/20209:38 AM  Erin Benitez 02/18/1950, 68 y.o., female [id number]  Chief Complaint  Patient presents with  . Hypertension  . Diabetes    wants to make sure she is doding everthing she can to keep  dm under control. Living to come off all medication    HPI:   Patient is a 68 y.o. female with past medical history significant for HTN, DM2, GERD, HLP, diverticulosis who presents today for routine followup  Last OV June 2020 Started actos and glipizide Added HCTZ Increased crestor Referred to cards Increased PPI to BID  Saw cards yesterday - plan for lexiscan stress test and echo, started metoprolol XR 69m, hold ASA  Patient reports that she has has been feeling better Has been checking BP at home Has cut back on candy, cookies, etc, simple sugars Not checking cbgs at home - insurance did not cover Tolerating new DM2 meds Her nocturia has significantly decreased Nocturnal GERD has resolved DOE has improved but still present, going up stairs, heavy house work She is fasting today   Lab Results  Component Value Date   HGBA1C 12.0 (A) 07/18/2018   HGBA1C 10.0 (A) 09/30/2017   HGBA1C 8.7 (H) 06/03/2017   Lab Results  Component Value Date   LDLCALC 118 (H) 07/18/2018   CREATININE 0.76 07/18/2018    Depression screen PHQ 2/9 08/21/2018 07/18/2018 09/30/2017  Decreased Interest 0 0 0  Down, Depressed, Hopeless 0 0 0  PHQ - 2 Score 0 0 0    Fall Risk  08/21/2018 07/18/2018 07/18/2018 09/30/2017 08/21/2017  Falls in the past year? 0 0 0 No No  Number falls in past yr: 0 0 0 - -  Injury with Fall? 0 0 0 - -  Follow up - - Falls evaluation completed;Education provided;Falls prevention discussed - -     Allergies  Allergen Reactions  . Lisinopril Swelling  . Peanut-Containing Drug Products Anaphylaxis, Hives, Swelling and Other (See Comments)    Pistachio's - swelling of the throat     . Tramadol Nausea And Vomiting    Headache & dizziness with n&v    Prior to  Admission medications   Medication Sig Start Date Benitez Date Taking? Authorizing Provider  albuterol (VENTOLIN HFA) 108 (90 Base) MCG/ACT inhaler Inhale 2 puffs into the lungs every 6 (six) hours as needed for wheezing or shortness of breath. 05/28/18  Yes Hawks, Christy A, FNP  amLODipine (NORVASC) 10 MG tablet Take 1 tablet (10 mg total) by mouth daily. 07/18/18  Yes SRutherford Guys MD  aspirin 81 MG chewable tablet Chew 81 mg by mouth daily.   Yes [provider]  blood glucose meter kit and supplies Per insurance preference. Check glucose once a day, alternate between fasting and 2 hours after dinner. 07/18/18  Yes SRutherford Guys MD  Blood Pressure Monitor KIT 1 Units by Does not apply route daily. 06/03/17  Yes WTimmothy Euler BT[place], PA-C  fluticasone (FLONASE) 50 MCG/ACT nasal spray Place 1 spray into both nostrils 2 (two) times daily. 08/21/17  Yes SRutherford Guys MD  glipiZIDE (GLUCOTROL) 5 MG tablet Take 1 tablet (5 mg total) by mouth 2 (two) times daily before a meal. 07/18/18  Yes SRutherford Guys MD  metoprolol succinate (TOPROL XL) 25 MG 24 hr tablet Take 1 tablet (25 mg total) by mouth daily. 08/20/18  Yes KMiquel Dunn NP  pantoprazole (PROTONIX) 20 MG tablet Take 1 tablet (20 mg total) by mouth 2 (two) times daily  before a meal. 07/24/18  Yes [name], MD  pioglitazone (ACTOS) 30 MG tablet Take 1 tablet (30 mg total) by mouth daily. 07/18/18  Yes [name], MD  rosuvastatin (CRESTOR) 40 MG tablet Take 1 tablet (40 mg total) by mouth daily. 07/18/18  Yes [name], MD  triamterene-hydrochlorothiazide (MAXZIDE-25) 37.5-25 MG tablet Take 1 tablet by mouth daily. 07/18/18  Yes [name], MD    Past Medical History:  Diagnosis Date  . Allergy   . Asthma   . Bronchitis   . Cataract   . DM2 (diabetes mellitus, type 2) ([place])   . GERD (gastroesophageal reflux disease)   . Heart murmur   . Hypertension     Past Surgical History:  Procedure  Laterality Date  . CESAREAN SECTION    . COLONOSCOPY    . TONSILLECTOMY      Social History   Tobacco Use  . Smoking status: Never Smoker  . Smokeless tobacco: Never Used  Substance Use Topics  . Alcohol use: No    Family History  Problem Relation Age of Onset  . Cancer Mother        ovarian cancer  . Heart disease Mother   . Liver disease Mother   . Hyperlipidemia Mother   . Stroke Mother   . Cancer Father        lung cancer  . Cancer Sister   . Hyperlipidemia Sister   . Cancer Sister   . Hyperlipidemia Sister   . Diabetes Maternal Aunt   . Colon cancer Maternal Aunt        x2 aunts  . Colon cancer Maternal Uncle        2 uncles  . Colon cancer Paternal Grandfather   . Esophageal cancer Neg Hx   . Rectal cancer Neg Hx   . Stomach cancer Neg Hx     ROS Per hpi  OBJECTIVE:  Today's Vitals   08/21/18 0911  BP: 122/74  Pulse: 76  Temp: 98.3 F (36.8 C)  TempSrc: Oral  SpO2: 100%  Weight: 141 lb (64 kg)  Height: _0  (1.626 m)   Body mass index is 24.2 kg/m.   Physical Exam Vitals signs and nursing note reviewed.  Constitutional:      Appearance: She is well-developed.  HENT:     Head: Normocephalic and atraumatic.  Eyes:     General: No scleral icterus.    Conjunctiva/sclera: Conjunctivae normal.     Pupils: Pupils are equal, round, and reactive to light.  Neck:     Musculoskeletal: Neck supple.  Pulmonary:     Effort: Pulmonary effort is normal.  Skin:    General: Skin is warm and dry.  Neurological:     Mental Status: She is alert and oriented to person, place, and time.     ASSESSMENT and PLAN  1. Type 2 diabetes mellitus without complication, without long-term current use of insulin (HCC) More than 50% of this 25 min visit was spent discussing LFM, carb counting, sharing resources.  - Ambulatory referral to Ophthalmology  2. Hyperlipidemia, unspecified hyperlipidemia type Checking labs today, medications will be adjusted as  needed.  - Lipid panel - Comprehensive metabolic panel  3. Essential hypertension Controlled. Continue current regime.   4. Need for vaccination - Pneumococcal polysaccharide vaccine 23-valent greater than or equal to 2yo subcutaneous/IM  Return in about 2 months (around 10/22/2018).    [name], MD [facility]  [place], [place] [address] Ph.  [phone] Fax [phone]

## 2018-08-29 ENCOUNTER — Other Ambulatory Visit: Payer: Self-pay

## 2018-08-29 ENCOUNTER — Ambulatory Visit (INDEPENDENT_AMBULATORY_CARE_PROVIDER_SITE_OTHER): Payer: Medicare Other

## 2018-08-29 DIAGNOSIS — R0602 Shortness of breath: Secondary | ICD-10-CM

## 2018-08-29 DIAGNOSIS — R0789 Other chest pain: Secondary | ICD-10-CM

## 2018-09-03 DIAGNOSIS — E119 Type 2 diabetes mellitus without complications: Secondary | ICD-10-CM | POA: Diagnosis not present

## 2018-09-03 DIAGNOSIS — Z961 Presence of intraocular lens: Secondary | ICD-10-CM | POA: Diagnosis not present

## 2018-09-03 DIAGNOSIS — H2640 Unspecified secondary cataract: Secondary | ICD-10-CM | POA: Diagnosis not present

## 2018-09-03 DIAGNOSIS — H40013 Open angle with borderline findings, low risk, bilateral: Secondary | ICD-10-CM | POA: Diagnosis not present

## 2018-09-03 LAB — HM DIABETES EYE EXAM

## 2018-09-24 DIAGNOSIS — H26493 Other secondary cataract, bilateral: Secondary | ICD-10-CM | POA: Diagnosis not present

## 2018-09-24 DIAGNOSIS — H35033 Hypertensive retinopathy, bilateral: Secondary | ICD-10-CM | POA: Diagnosis not present

## 2018-09-24 DIAGNOSIS — H26492 Other secondary cataract, left eye: Secondary | ICD-10-CM | POA: Diagnosis not present

## 2018-09-24 LAB — HM DIABETES EYE EXAM

## 2018-09-26 ENCOUNTER — Telehealth: Payer: Medicare Other | Admitting: Nurse Practitioner

## 2018-09-26 DIAGNOSIS — J069 Acute upper respiratory infection, unspecified: Secondary | ICD-10-CM

## 2018-09-26 MED ORDER — BENZONATATE 100 MG PO CAPS: 100.0000 mg | ORAL_CAPSULE | Freq: Three times a day (TID) | ORAL | 0 refills | Status: DC | PRN

## 2018-09-26 NOTE — Progress Notes (Signed)

## 2018-10-01 ENCOUNTER — Other Ambulatory Visit: Payer: Self-pay

## 2018-10-01 ENCOUNTER — Encounter: Payer: Self-pay | Admitting: Cardiology

## 2018-10-01 ENCOUNTER — Ambulatory Visit (INDEPENDENT_AMBULATORY_CARE_PROVIDER_SITE_OTHER): Payer: Medicare Other | Admitting: Cardiology

## 2018-10-01 ENCOUNTER — Ambulatory Visit (INDEPENDENT_AMBULATORY_CARE_PROVIDER_SITE_OTHER): Payer: Medicare Other

## 2018-10-01 VITALS — BP 145/80 | HR 74 | Ht 64.0 in | Wt 147.0 lb

## 2018-10-01 DIAGNOSIS — K219 Gastro-esophageal reflux disease without esophagitis: Secondary | ICD-10-CM

## 2018-10-01 DIAGNOSIS — R0789 Other chest pain: Secondary | ICD-10-CM

## 2018-10-01 DIAGNOSIS — I350 Nonrheumatic aortic (valve) stenosis: Secondary | ICD-10-CM | POA: Diagnosis not present

## 2018-10-01 DIAGNOSIS — R0602 Shortness of breath: Secondary | ICD-10-CM

## 2018-10-01 DIAGNOSIS — I10 Essential (primary) hypertension: Secondary | ICD-10-CM | POA: Diagnosis not present

## 2018-10-01 NOTE — Progress Notes (Signed)
Primary Physician:  [name], MD   Patient ID: Erin Benitez, female    DOB: 1951/01/01, 68 y.o.   MRN: [id number]  Subjective:    Chief Complaint  Patient presents with  . Hypertension  . Follow-up    HPI: Erin Benitez  is a 68 y.o. female  with hypertension, type 2 diabetes, GERD, recently evaluated by [place] for symptoms of both angina and atypical chest pain. Underwent lexiscan nuclear stress testing and echocardiogram and now presents for follow up.   Since last seen by [place], she reports that she is feeling some better. States that her chest discomfort and shortness of breath have essentially resolved. She does mention occasional chest discomfort after laying down at night, that improves with raising up. She has just started Protonix twice a day that she states has helped some.  Hyperlipidemia was felt to be likely familial hyperlipidemia.  Crestor was increased to 40 mg daily. Diabetes is uncontrolled, but she has made significant diet changes. Has known heart murmur for many years. Denies any previous cardiac workup.   She does walk 1 mile a day that she tolerates fairly well. She does notice tiredness if she walks more than 1 mile per day. She does report that her cousin had a MI at age 19. No former tobacco use.   Past Medical History:  Diagnosis Date  . Allergy   . Asthma   . Bronchitis   . Cataract   . DM2 (diabetes mellitus, type 2) ([place])   . GERD (gastroesophageal reflux disease)   . Heart murmur   . Hypertension     Past Surgical History:  Procedure Laterality Date  . CESAREAN SECTION    . COLONOSCOPY    . TONSILLECTOMY      Social History   Socioeconomic History  . Marital status: Single    Spouse name: Not on file  . Number of children: 1  . Years of education: Not on file  . Highest education level: Not on file  Occupational History  . Occupation: Tourist information centre manager  Social Needs  . Financial resource strain: Not on file  . Food insecurity    Worry: Not  on file    Inability: Not on file  . Transportation needs    Medical: Not on file    Non-medical: Not on file  Tobacco Use  . Smoking status: Never Smoker  . Smokeless tobacco: Never Used  Substance and Sexual Activity  . Alcohol use: No  . Drug use: No  . Sexual activity: Yes  Lifestyle  . Physical activity    Days per week: Not on file    Minutes per session: Not on file  . Stress: Not on file  Relationships  . Social Herbalist on phone: Not on file    Gets together: Not on file    Attends religious service: Not on file    Active member of club or organization: Not on file    Attends meetings of clubs or organizations: Not on file    Relationship status: Not on file  . Intimate partner violence    Fear of current or ex partner: Not on file    Emotionally abused: Not on file    Physically abused: Not on file    Forced sexual activity: Not on file  Other Topics Concern  . Not on file  Social History Narrative   She reports she is single. She has 1 daughter. She is an Tourist information centre manager.  2 caffeinated beverages daily.    Review of Systems  Constitution: Negative for decreased appetite, malaise/fatigue, weight gain and weight loss.  Eyes: Negative for visual disturbance.  Cardiovascular: Negative for chest pain, claudication, dyspnea on exertion, leg swelling, orthopnea, palpitations and syncope.  Respiratory: Negative for hemoptysis, shortness of breath and wheezing.   Endocrine: Negative for cold intolerance and heat intolerance.  Hematologic/Lymphatic: Does not bruise/bleed easily.  Skin: Negative for nail changes.  Musculoskeletal: Negative for muscle weakness and myalgias.  Gastrointestinal: Negative for abdominal pain, change in bowel habit, nausea and vomiting.  Neurological: Negative for difficulty with concentration, dizziness, focal weakness and headaches.  Psychiatric/Behavioral: Negative for altered mental status and suicidal ideas.  All other systems  reviewed and are negative.     Objective:  Blood pressure (!) 145/80, pulse 74, height 5' 4"  (1.626 m), weight 147 lb (66.7 kg), SpO2 98 %. Body mass index is 25.23 kg/m.    Physical Exam  Constitutional: She is oriented to person, place, and time. Vital signs are normal. She appears well-developed and well-nourished.  HENT:  Head: Normocephalic and atraumatic.  Neck: Normal range of motion.  Cardiovascular: Normal rate, regular rhythm and intact distal pulses.  Murmur heard.  Harsh early systolic murmur is present with a grade of 2/6 at the upper right sternal border and apex. Pulmonary/Chest: Effort normal and breath sounds normal. No accessory muscle usage. No respiratory distress.  Abdominal: Soft. Bowel sounds are normal.  Musculoskeletal: Normal range of motion.  Neurological: She is alert and oriented to person, place, and time.  Skin: Skin is warm and dry.  Vitals reviewed.  Radiology: No results found.  Laboratory examination:    CMP Latest Ref Rng & Units 08/21/2018 07/18/2018 09/30/2017  Glucose 65 - 99 mg/dL 185(H) 369(H) 257(H)  BUN 8 - 27 mg/dL 18 14 12   Creatinine 0.57 - 1.00 mg/dL 1.11(H) 0.76 0.73  Sodium 134 - 144 mmol/L 139 136 138  Potassium 3.5 - 5.2 mmol/L 5.1 5.1 4.9  Chloride 96 - 106 mmol/L 97 96 98  CO2 20 - 29 mmol/L 25 23 18(L)  Calcium 8.7 - 10.3 mg/dL 10.1 10.0 9.7  Total Protein 6.0 - 8.5 g/dL 7.9 7.6 7.7  Total Bilirubin 0.0 - 1.2 mg/dL 0.3 0.4 0.4  Alkaline Phos 39 - 117 IU/L 85 110 112  AST 0 - 40 IU/L 15 18 17   ALT 0 - 32 IU/L 17 22 18    CBC Latest Ref Rng & Units 07/18/2018 06/03/2017 03/07/2015  WBC 3.4 - 10.8 x10E3/uL 5.3 4.0 4.4(A)  Hemoglobin 11.1 - 15.9 g/dL 14.3 13.3 13.3  Hematocrit 34.0 - 46.6 % 41.9 39.6 38.1  Platelets 150 - 450 x10E3/uL 241 271 -   Lipid Panel     Component Value Date/Time   CHOL 273 (H) 08/21/2018 1123   TRIG 223 (H) 08/21/2018 1123   HDL 47 08/21/2018 1123   CHOLHDL 5.8 (H) 08/21/2018 1123   CHOLHDL 7.0  (H) 03/07/2015 1016   VLDL 37 (H) 03/07/2015 1016   LDLCALC 181 (H) 08/21/2018 1123   HEMOGLOBIN A1C Lab Results  Component Value Date   HGBA1C 12.0 (A) 07/18/2018   TSH Recent Labs    07/18/18 1413  TSH 1.420    PRN Meds:. Medications Discontinued During This Encounter  Medication Reason  . aspirin 81 MG chewable tablet Error  . benzonatate (TESSALON PERLES) 100 MG capsule Error   Current Meds  Medication Sig  . albuterol (VENTOLIN HFA) 108 (90 Base) MCG/ACT  inhaler Inhale 2 puffs into the lungs every 6 (six) hours as needed for wheezing or shortness of breath.  Marland Kitchen amLODipine (NORVASC) 10 MG tablet Take 1 tablet (10 mg total) by mouth daily.  . blood glucose meter kit and supplies Per insurance preference. Check glucose once a day, alternate between fasting and 2 hours after dinner.  . Blood Pressure Monitor KIT 1 Units by Does not apply route daily.  . fluticasone (FLONASE) 50 MCG/ACT nasal spray Place 1 spray into both nostrils 2 (two) times daily.  Marland Kitchen glipiZIDE (GLUCOTROL) 5 MG tablet Take 1 tablet (5 mg total) by mouth 2 (two) times daily before a meal.  . metoprolol succinate (TOPROL XL) 25 MG 24 hr tablet Take 1 tablet (25 mg total) by mouth daily.  . pantoprazole (PROTONIX) 20 MG tablet Take 1 tablet (20 mg total) by mouth 2 (two) times daily before a meal.  . pioglitazone (ACTOS) 30 MG tablet Take 1 tablet (30 mg total) by mouth daily.  . rosuvastatin (CRESTOR) 40 MG tablet Take 1 tablet (40 mg total) by mouth daily.  Marland Kitchen triamterene-hydrochlorothiazide (MAXZIDE-25) 37.5-25 MG tablet Take 1 tablet by mouth daily.    Cardiac Studies:   Lexiscan Myoview Stress Test 08/29/2018: Stress EKG is non-diagnostic, as this is pharmacological stress test. During infusion the patient developed nausea, chest pain, & headache.  Myocardial perfusion imaging is normal. Left ventricular ejection fraction is  62% with normal wall motion. Low risk study.   Assessment:     ICD-10-CM    1. Atypical chest pain  R07.89   2. Gastroesophageal reflux disease without esophagitis  K21.9   3. Essential hypertension  I10   4. Trace aortic stenosis by prior echocardiogram  I35.0     EKG 08/20/2018: Normal sinus rhythm at 80 bpm, normal axis, Q wave in ifnferior lead, most prominent in lead 3. Early R wave progression, cannot exclude posterior infarct old. No evidence of ischemia.   Recommendations:   Patient has had improvement in symptoms since last seen by [place] and reports that she is feeling much better. She has had a few episodes after laying down at night. She is to be evaluated by GI. I have discussed nuclear stress test results with the patient, low risk study without evidence of perfusion abnormalities. Echocardiogram showed normal LVEF, mild LVH, and trace AS. I have recommend aggressive BP control. Her blood pressure is elevated today, but states that she was nervous regarding her test results. She has been monitoring at home and states that it has been much better. I have recommended that she continue with metoprolol. Would recommend repeating echocardiogram if any clinical changes to aortic murmur for follow up on AS. She will continue to follow up with her PCP for risk factor modification. I will see her back on a PRN basis, but encouraged her to contact me if she has any worsening or new symptoms.    Miquel Dunn, MSN, APRN, FNP-C [facility] [place] Office: [phone] Fax: [phone]

## 2018-10-03 ENCOUNTER — Encounter: Payer: Self-pay | Admitting: Cardiology

## 2018-10-13 ENCOUNTER — Other Ambulatory Visit: Payer: Self-pay | Admitting: Cardiology

## 2018-10-13 ENCOUNTER — Other Ambulatory Visit: Payer: Self-pay | Admitting: Family Medicine

## 2018-10-23 ENCOUNTER — Ambulatory Visit (INDEPENDENT_AMBULATORY_CARE_PROVIDER_SITE_OTHER): Payer: Medicare Other | Admitting: Family Medicine

## 2018-10-23 ENCOUNTER — Other Ambulatory Visit: Payer: Self-pay

## 2018-10-23 ENCOUNTER — Encounter: Payer: Self-pay | Admitting: Family Medicine

## 2018-10-23 ENCOUNTER — Ambulatory Visit (INDEPENDENT_AMBULATORY_CARE_PROVIDER_SITE_OTHER): Payer: Medicare Other

## 2018-10-23 VITALS — BP 136/71 | HR 75 | Temp 98.4°F | Resp 12 | Wt 147.0 lb

## 2018-10-23 DIAGNOSIS — E782 Mixed hyperlipidemia: Secondary | ICD-10-CM

## 2018-10-23 DIAGNOSIS — Z23 Encounter for immunization: Secondary | ICD-10-CM

## 2018-10-23 DIAGNOSIS — I10 Essential (primary) hypertension: Secondary | ICD-10-CM

## 2018-10-23 DIAGNOSIS — R059 Cough, unspecified: Secondary | ICD-10-CM

## 2018-10-23 DIAGNOSIS — E1165 Type 2 diabetes mellitus with hyperglycemia: Secondary | ICD-10-CM

## 2018-10-23 DIAGNOSIS — Z780 Asymptomatic menopausal state: Secondary | ICD-10-CM

## 2018-10-23 DIAGNOSIS — R05 Cough: Secondary | ICD-10-CM

## 2018-10-23 LAB — POCT GLYCOSYLATED HEMOGLOBIN (HGB A1C): Hemoglobin A1C: 9 % — AB (ref 4.0–5.6)

## 2018-10-23 MED ORDER — METOPROLOL SUCCINATE ER 25 MG PO TB24: 25.0000 mg | ORAL_TABLET | Freq: Every day | ORAL | 0 refills | Status: DC

## 2018-10-23 MED ORDER — PIOGLITAZONE HCL 30 MG PO TABS: 30.0000 mg | ORAL_TABLET | Freq: Every day | ORAL | 1 refills | Status: DC

## 2018-10-23 NOTE — Patient Instructions (Signed)
° ° ° °  If you have lab work done today you will be contacted with your lab results within the next 2 weeks.  If you have not heard from us then please contact us. The fastest way to get your results is to register for My Chart. ° ° °IF you received an x-ray today, you will receive an invoice from [facility]. Please contact [facility] at [phone] with questions or concerns regarding your invoice.  ° °IF you received labwork today, you will receive an invoice from [facility]. Please contact [facility] at [phone] with questions or concerns regarding your invoice.  ° °Our billing staff will not be able to assist you with questions regarding bills from these companies. ° °You will be contacted with the lab results as soon as they are available. The fastest way to get your results is to activate your My Chart account. Instructions are located on the last page of this paperwork. If you have not heard from us regarding the results in 2 weeks, please contact this office. °  ° ° ° °

## 2018-10-23 NOTE — Progress Notes (Signed)
9/22/20209:21 AM  [name] 17-Aug-1950, 68 y.o., female [id number]  Chief Complaint  Patient presents with  . chronic medical condition     35monthf/u on chronic med conditions    HPI:   Patient is a 68y.o. female with past medical history significant for HTN, DM2, GERD, HLP, diverticulosis who presents today for routine followup  Last oV July 2020 no changes She started taking her cholesterol medication daily after our visit Not checking cbgs regularly at home Has been working on her diet, sign cut back on sweets, sodas, eating more veggies Exercising, walking daily  Patient worried about a dry cough since July 2020 No known triggers No fever, chills, changes in taste or smell, sneezing, postnasal drip, reflux, wheezing Albuterol sometimes helps Has also been taking OTC cough syrup Non smoker Denies h/o asthma  Lab Results  Component Value Date   HGBA1C 12.0 (A) 07/18/2018   HGBA1C 10.0 (A) 09/30/2017   HGBA1C 8.7 (H) 06/03/2017   Lab Results  Component Value Date   LDLCALC 181 (H) 08/21/2018   CREATININE 1.11 (H) 08/21/2018    Depression screen PHQ 2/9 10/23/2018 08/21/2018 07/18/2018  Decreased Interest 0 0 0  Down, Depressed, Hopeless 0 0 0  PHQ - 2 Score 0 0 0    Fall Risk  10/23/2018 08/21/2018 07/18/2018 07/18/2018 09/30/2017  Falls in the past year? 0 0 0 0 No  Number falls in past yr: 0 0 0 0 -  Injury with Fall? 0 0 0 0 -  Follow up Falls evaluation completed - - Falls evaluation completed;Education provided;Falls prevention discussed -     Allergies  Allergen Reactions  . Lisinopril Swelling  . Peanut-Containing Drug Products Anaphylaxis, Hives, Swelling and Other (See Comments)    Pistachio's - swelling of the throat     . Tramadol Nausea And Vomiting    Headache & dizziness with n&v    Prior to Admission medications   Medication Sig Start Date End Date Taking? Authorizing Provider  albuterol (VENTOLIN HFA) 108 (90 Base) MCG/ACT inhaler  Inhale 2 puffs into the lungs every 6 (six) hours as needed for wheezing or shortness of breath. 05/28/18  Yes Hawks, Christy A, FNP  amLODipine (NORVASC) 10 MG tablet Take 1 tablet (10 mg total) by mouth daily. 07/18/18  Yes SRutherford Guys MD  blood glucose meter kit and supplies Per insurance preference. Check glucose once a day, alternate between fasting and 2 hours after dinner. 07/18/18  Yes SRutherford Guys MD  Blood Pressure Monitor KIT 1 Units by Does not apply route daily. 06/03/17  Yes WTimmothy Euler BT[place]aniaD, PA-C  glipiZIDE (GLUCOTROL) 5 MG tablet Take 1 tablet (5 mg total) by mouth 2 (two) times daily before a meal. 07/18/18  Yes SRutherford Guys MD  metoprolol succinate (TOPROL-XL) 25 MG 24 hr tablet Take 1 tablet by mouth once daily 10/15/18  Yes KMiquel Dunn NP  pantoprazole (PROTONIX) 20 MG tablet Take 1 tablet (20 mg total) by mouth 2 (two) times daily before a meal. 07/24/18  Yes SRutherford Guys MD  pioglitazone (ACTOS) 30 MG tablet Take 1 tablet by mouth once daily 10/13/18  Yes SRutherford Guys MD  rosuvastatin (CRESTOR) 40 MG tablet Take 1 tablet (40 mg total) by mouth daily. 07/18/18  Yes SRutherford Guys MD  triamterene-hydrochlorothiazide (MAXZIDE-25) 37.5-25 MG tablet Take 1 tablet by mouth daily. 07/18/18  Yes SRutherford Guys MD    Past Medical History:  Diagnosis  Date  . Allergy   . Asthma   . Bronchitis   . Cataract   . DM2 (diabetes mellitus, type 2) ([place])   . GERD (gastroesophageal reflux disease)   . Heart murmur   . Hypertension     Past Surgical History:  Procedure Laterality Date  . CESAREAN SECTION    . COLONOSCOPY    . TONSILLECTOMY      Social History   Tobacco Use  . Smoking status: Never Smoker  . Smokeless tobacco: Never Used  Substance Use Topics  . Alcohol use: No    Family History  Problem Relation Age of Onset  . Cancer Mother        ovarian cancer  . Heart disease Mother   . Liver disease Mother   . Hyperlipidemia  Mother   . Stroke Mother   . Cancer Father        lung cancer  . Cancer Sister   . Hyperlipidemia Sister   . Cancer Sister   . Hyperlipidemia Sister   . Diabetes Maternal Aunt   . Colon cancer Maternal Aunt        x2 aunts  . Colon cancer Maternal Uncle        2 uncles  . Colon cancer Paternal Grandfather   . Esophageal cancer Neg Hx   . Rectal cancer Neg Hx   . Stomach cancer Neg Hx     Review of Systems  Constitutional: Negative for chills and fever.  Respiratory: Positive for cough. Negative for shortness of breath and wheezing.   Cardiovascular: Negative for chest pain, palpitations and leg swelling.  Gastrointestinal: Positive for nausea. Negative for abdominal pain, constipation, diarrhea, heartburn and vomiting.  Genitourinary: Negative for frequency and urgency.  Endo/Heme/Allergies: Negative for polydipsia.   OBJECTIVE:  Today's Vitals   10/23/18 0906  BP: 136/71  Pulse: 75  Resp: 12  Temp: 98.4 F (36.9 C)  TempSrc: Oral  SpO2: 98%  Weight: 147 lb (66.7 kg)   Body mass index is 25.23 kg/m.  Wt Readings from Last 3 Encounters:  10/23/18 147 lb (66.7 kg)  10/01/18 147 lb (66.7 kg)  08/21/18 141 lb (64 kg)    Physical Exam Vitals signs and nursing note reviewed.  Constitutional:      Appearance: She is well-developed.  HENT:     Head: Normocephalic and atraumatic.     Right Ear: Hearing, tympanic membrane, ear canal and external ear normal.     Left Ear: Hearing, tympanic membrane, ear canal and external ear normal.  Eyes:     Conjunctiva/sclera: Conjunctivae normal.     Pupils: Pupils are equal, round, and reactive to light.  Neck:     Musculoskeletal: Neck supple.  Cardiovascular:     Rate and Rhythm: Normal rate and regular rhythm.     Heart sounds: Normal heart sounds. No murmur. No friction rub. No gallop.   Pulmonary:     Effort: Pulmonary effort is normal.     Breath sounds: Normal breath sounds. No wheezing or rales.  Lymphadenopathy:      Cervical: No cervical adenopathy.  Skin:    General: Skin is warm and dry.  Neurological:     Mental Status: She is alert and oriented to person, place, and time.     Results for orders placed or performed in visit on 10/23/18 (from the past 24 hour(s))  POCT glycosylated hemoglobin (Hb A1C)     Status: Abnormal   Collection Time: 10/23/18  9:44  AM  Result Value Ref Range   Hemoglobin A1C 9.0 (A) 4.0 - 5.6 %   HbA1c POC (<> result, manual entry)     HbA1c, POC (prediabetic range)     HbA1c, POC (controlled diabetic range)      Dg Chest 2 View  Result Date: 10/23/2018 CLINICAL DATA:  Cough for 2 months EXAM: CHEST - 2 VIEW COMPARISON:  July 24, 2018 FINDINGS: The heart size and mediastinal contours are within normal limits. Aortic knob calcifications. Both lungs are clear. No acute osseous abnormality IMPRESSION: No acute cardiopulmonary process. Electronically Signed   By: Prudencio Pair M.D.   On: 10/23/2018 09:53     ASSESSMENT and PLAN  1. Type 2 diabetes mellitus with hyperglycemia, without long-term current use of insulin (HCC) Improved, cont current mgt and LFM - POCT glycosylated hemoglobin (Hb A1C)  2. Essential hypertension Controlled. Continue current regime.  - Comprehensive metabolic panel  3. Mixed hyperlipidemia Checking labs today, medications will be adjusted as needed.  - Lipid panel  4. Postmenopausal estrogen deficiency - DG Bone Density; Future  5. Need for prophylactic vaccination and inoculation against influenza - Flu Vaccine QUAD High Dose(Fluad)  6. Cough Mild, intermittent, exam, VSS and xray reassuring, cont with supportive measures. RTC precautions given. - DG Chest 2 View; Future  Other orders - metoprolol succinate (TOPROL-XL) 25 MG 24 hr tablet; Take 1 tablet (25 mg total) by mouth daily. - pioglitazone (ACTOS) 30 MG tablet; Take 1 tablet (30 mg total) by mouth daily.  Return in about 3 months (around 01/22/2019).    [name], MD Primary Care at [place], [place] [address] Ph.  [phone] Fax [phone]

## 2018-10-24 LAB — LIPID PANEL
Chol/HDL Ratio: 4.5 ratio — ABNORMAL HIGH (ref 0.0–4.4)
Cholesterol, Total: 193 mg/dL (ref 100–199)
HDL: 43 mg/dL (ref >39)
LDL Chol Calc (NIH): 105 mg/dL — ABNORMAL HIGH (ref 0–99)
Triglycerides: 263 mg/dL — ABNORMAL HIGH (ref 0–149)
VLDL Cholesterol Cal: 45 mg/dL — ABNORMAL HIGH (ref 5–40)

## 2018-10-24 LAB — COMPREHENSIVE METABOLIC PANEL
ALT: 15 IU/L (ref 0–32)
AST: 13 IU/L (ref 0–40)
Albumin/Globulin Ratio: 1.8 (ref 1.2–2.2)
Albumin: 4.9 g/dL — ABNORMAL HIGH (ref 3.8–4.8)
Alkaline Phosphatase: 96 IU/L (ref 39–117)
BUN/Creatinine Ratio: 19 (ref 12–28)
BUN: 28 mg/dL — ABNORMAL HIGH (ref 8–27)
Bilirubin Total: 0.4 mg/dL (ref 0.0–1.2)
CO2: 25 mmol/L (ref 20–29)
Calcium: 9.9 mg/dL (ref 8.7–10.3)
Chloride: 98 mmol/L (ref 96–106)
Creatinine, Ser: 1.45 mg/dL — ABNORMAL HIGH (ref 0.57–1.00)
GFR calc Af Amer: 43 mL/min/{1.73_m2} — ABNORMAL LOW (ref >59)
GFR calc non Af Amer: 37 mL/min/{1.73_m2} — ABNORMAL LOW (ref >59)
Globulin, Total: 2.8 g/dL (ref 1.5–4.5)
Glucose: 271 mg/dL — ABNORMAL HIGH (ref 65–99)
Potassium: 4.8 mmol/L (ref 3.5–5.2)
Sodium: 138 mmol/L (ref 134–144)
Total Protein: 7.7 g/dL (ref 6.0–8.5)

## 2018-11-01 NOTE — Addendum Note (Signed)
Addended by: [name] on: 11/01/2018 03:15 PM   Modules accepted: Orders

## 2018-11-04 ENCOUNTER — Telehealth: Payer: Medicare Other | Admitting: Family

## 2018-11-04 DIAGNOSIS — J069 Acute upper respiratory infection, unspecified: Secondary | ICD-10-CM

## 2018-11-04 DIAGNOSIS — B9689 Other specified bacterial agents as the cause of diseases classified elsewhere: Secondary | ICD-10-CM

## 2018-11-04 MED ORDER — AMOXICILLIN-POT CLAVULANATE 875-125 MG PO TABS: 1.0000 | ORAL_TABLET | Freq: Two times a day (BID) | ORAL | 0 refills | Status: DC

## 2018-11-04 NOTE — Progress Notes (Signed)

## 2018-12-21 ENCOUNTER — Other Ambulatory Visit: Payer: Self-pay

## 2018-12-21 DIAGNOSIS — Z20822 Contact with and (suspected) exposure to covid-19: Secondary | ICD-10-CM

## 2018-12-24 LAB — NOVEL CORONAVIRUS, NAA: SARS-CoV-2, NAA: NOT DETECTED

## 2019-01-08 ENCOUNTER — Ambulatory Visit
Admission: RE | Admit: 2019-01-08 | Discharge: 2019-01-08 | Disposition: A | Discharge status: Home / Self Care | Payer: Medicare Other | Source: Ambulatory Visit | Attending: Family Medicine | Admitting: Family Medicine

## 2019-01-08 ENCOUNTER — Other Ambulatory Visit: Payer: Self-pay

## 2019-01-08 DIAGNOSIS — Z780 Asymptomatic menopausal state: Secondary | ICD-10-CM

## 2019-01-08 DIAGNOSIS — M85852 Other specified disorders of bone density and structure, left thigh: Secondary | ICD-10-CM | POA: Diagnosis not present

## 2019-01-21 ENCOUNTER — Ambulatory Visit: Payer: Medicare Other | Admitting: Family Medicine

## 2019-01-28 ENCOUNTER — Ambulatory Visit (INDEPENDENT_AMBULATORY_CARE_PROVIDER_SITE_OTHER): Payer: Medicare Other | Admitting: Family Medicine

## 2019-01-28 ENCOUNTER — Other Ambulatory Visit: Payer: Self-pay

## 2019-01-28 ENCOUNTER — Encounter: Payer: Self-pay | Admitting: Family Medicine

## 2019-01-28 ENCOUNTER — Telehealth: Payer: Self-pay

## 2019-01-28 VITALS — BP 122/80 | HR 87 | Temp 97.7°F | Ht 64.0 in | Wt 148.0 lb

## 2019-01-28 DIAGNOSIS — E1165 Type 2 diabetes mellitus with hyperglycemia: Secondary | ICD-10-CM

## 2019-01-28 DIAGNOSIS — M8580 Other specified disorders of bone density and structure, unspecified site: Secondary | ICD-10-CM

## 2019-01-28 DIAGNOSIS — Z1231 Encounter for screening mammogram for malignant neoplasm of breast: Secondary | ICD-10-CM

## 2019-01-28 DIAGNOSIS — E782 Mixed hyperlipidemia: Secondary | ICD-10-CM

## 2019-01-28 DIAGNOSIS — I10 Essential (primary) hypertension: Secondary | ICD-10-CM

## 2019-01-28 DIAGNOSIS — Z780 Asymptomatic menopausal state: Secondary | ICD-10-CM

## 2019-01-28 LAB — POCT GLYCOSYLATED HEMOGLOBIN (HGB A1C): Hemoglobin A1C: 7.6 % — AB (ref 4.0–5.6)

## 2019-01-28 MED ORDER — AMLODIPINE BESYLATE 10 MG PO TABS: 10.0000 mg | ORAL_TABLET | Freq: Every day | ORAL | 1 refills | Status: DC

## 2019-01-28 MED ORDER — GLIPIZIDE 5 MG PO TABS: 5.0000 mg | ORAL_TABLET | Freq: Two times a day (BID) | ORAL | 1 refills | Status: DC

## 2019-01-28 NOTE — Patient Instructions (Addendum)
Aim for daily intake of 1000mg  of calcium.  Start vitamin D3 supplement 2000 units daily.    If you have lab work done today you will be contacted with your lab results within the next 2 weeks.  If you have not heard from [place] then please contact us. The fastest way to get your results is to register for My Chart.   IF you received an x-ray today, you will receive an invoice from [facility]. Please contact [facility] at [phone] with questions or concerns regarding your invoice.   IF you received labwork today, you will receive an invoice from [employer]. Please contact [facility] at [phone] with questions or concerns regarding your invoice.   Our billing staff will not be able to assist you with questions regarding bills from these companies.  You will be contacted with the lab results as soon as they are available. The fastest way to get your results is to activate your My Chart account. Instructions are located on the last page of this paperwork. If you have not heard from [place] regarding the results in 2 weeks, please contact this office.     Calcium Content in Foods Calcium is the most abundant mineral in your body. Most of your body's calcium supply is stored in your bones and teeth. Calcium helps many parts of the body function normally, including:  Blood and blood vessels.  Nerves.  Hormones.  Muscles.  Bones and teeth. When your calcium stores are low, you may be at risk for low bone mass, bone loss, and broken bones (fractures). When you get enough calcium, it helps to support strong bones and teeth throughout your life. Calcium is especially important for:  Children during growth spurts.  Girls during adolescence.  Women who are pregnant or breastfeeding.  Women after their menstrual cycle stops (postmenopause).  Women whose menstrual cycle has stopped due to anorexia nervosa or regular intense exercise.  People who cannot eat or digest dairy  products.  Vegans. What are tips for getting more calcium? General information  Try to get most of your calcium from food. Eat foods that are high in calcium.  Some people may benefit from taking calcium supplements. Check with your health care provider or diet and nutrition specialist (dietitian) before starting any calcium supplements. Calcium supplements may interact with certain medicines. Too much calcium may cause other health problems, like constipation and kidney stones.  For the body to absorb calcium, it needs vitamin D. Sources of vitamin D include: ? Skin exposure to direct sunlight. ? Foods, such as egg yolks, liver, saltwater fish, and fortified milk. ? Vitamin D supplements. Check with your health care provider or dietitian before starting any vitamin D supplements. What foods are high in calcium?  High-calcium foods are those that contain more than 100 milligrams (mg) of calcium per serving. Fruits  Fortified orange or other fruit juice, 300 mg per 8 oz serving. Vegetables  Collard greens, 360 mg per 8 oz serving.  Kale, 180 mg per 8 oz serving.  Bok choy, 160 mg per 8 oz serving. Grains  Fortified ready-to-eat cereals, 100-1,000 mg per 8 oz serving.  Fortified frozen waffles, 200 mg in two waffles. Meats and other proteins  Sardines, canned with bones, 325 mg per 3 oz serving.  Salmon, canned with bones, 180 mg per 3 oz serving.  Canned shrimp, 125 mg per 3 oz serving.  Baked beans, 160 mg per 4 oz serving. Dairy  Yogurt, plain, low-fat, 310 mg  per 6 oz serving.  Milk, 300 mg per 8 oz serving.  American cheese, 195 mg per 1 oz serving.  Cheddar cheese, 205 mg per 1 oz serving.  Cottage cheese 2%, 105 mg per 4 oz serving.  Fortified soy, rice, or almond milk, 300 mg per 8 oz serving. The items listed above may not be a complete list of foods high in calcium. Actual amounts of calcium may be different depending on processing. Contact a dietitian  for more information. What foods are lower in calcium? Foods lower in calcium are those that contain 50 mg of calcium or less per serving. Fruits  Apple, about 6 mg in one apple.  Banana, about 12 mg in one banana. Vegetables  Lettuce, 19 mg per 2 oz serving.  Tomato, about 11 mg in one tomato. Grains  Rice, 4 mg per 6 oz serving.  Boiled potatoes, 14 mg per 8 oz serving.  White bread, 6 mg in one slice. Meats and other proteins  Egg, 27 mg per 2 oz serving.  Red meat, 7 mg per 4 oz serving.  Chicken, 17 mg per 4 oz serving.  Fish, cod or trout, 20 mg per 4 oz serving. The items listed above may not be a complete list of foods lower in calcium. Actual amounts of calcium may be different depending on processing. Contact a dietitian for more information. Summary  Calcium is an important mineral in the body because it affects many functions. Getting enough calcium helps support strong bones and teeth throughout your life.  Try to get most of your calcium from food.  Calcium supplements may interact with certain medicines. Check with your health care provider before starting any calcium supplements. This information is not intended to replace advice given to you by your health care provider. Make sure you discuss any questions you have with your health care provider. Document Released: 09/01/2003 Document Revised: 01/10/2017 Document Reviewed: 01/10/2017 Elsevier Patient Education  2020 [employer].

## 2019-01-28 NOTE — Progress Notes (Signed)
12/28/20208:35 AM  Erin Benitez 08/08/1950, 68 y.o., female [id number]  Chief Complaint  Patient presents with  . Follow-up    went to have bones scan, was told to start on b12. Has been trying to schedule endo appt like instucted. Calling to do that for pt.    HPI:   Patient is a 68 y.o. female with past medical history significant for HTN, DM2, GERD, HLP, diverticulosiswho presents today forroutine followup  Last OV sept 2020 - no changes Has not yet heard from endo Worsening CKD Dexa; osteopenia, low frax score  Not checking cbgs Tries to walk on a regular basis Active at work Reports that she relaxed her diet over the holidays, reports polydipsia and polyuria Denies any lows  Lab Results  Component Value Date   HGBA1C 9.0 (A) 10/23/2018   HGBA1C 12.0 (A) 07/18/2018   HGBA1C 10.0 (A) 09/30/2017   Lab Results  Component Value Date   LDLCALC 105 (H) 10/23/2018   CREATININE 1.45 (H) 10/23/2018    Depression screen PHQ 2/9 01/28/2019 10/23/2018 08/21/2018  Decreased Interest 0 0 0  Down, Depressed, Hopeless 0 0 0  PHQ - 2 Score 0 0 0    Fall Risk  01/28/2019 10/23/2018 08/21/2018 07/18/2018 07/18/2018  Falls in the past year? 0 0 0 0 0  Number falls in past yr: 0 0 0 0 0  Injury with Fall? 0 0 0 0 0  Follow up - Falls evaluation completed - - Falls evaluation completed;Education provided;Falls prevention discussed     Allergies  Allergen Reactions  . Lisinopril Swelling  . Peanut-Containing Drug Products Anaphylaxis, Hives, Swelling and Other (See Comments)    Pistachio's - swelling of the throat     . Tramadol Nausea And Vomiting    Headache & dizziness with n&v    Prior to Admission medications   Medication Sig Start Date [place] Date Taking? Authorizing Provider  albuterol (VENTOLIN HFA) 108 (90 Base) MCG/ACT inhaler Inhale 2 puffs into the lungs every 6 (six) hours as needed for wheezing or shortness of breath. 05/28/18  Yes Hawks, Christy A, FNP    amLODipine (NORVASC) 10 MG tablet Take 1 tablet (10 mg total) by mouth daily. 07/18/18  Yes [name], MD  amoxicillin-clavulanate (AUGMENTIN) 875-125 MG tablet Take 1 tablet by mouth 2 (two) times daily. 11/04/18  Yes Kennyth Arnold, FNP  blood glucose meter kit and supplies Per insurance preference. Check glucose once a day, alternate between fasting and 2 hours after dinner. 07/18/18  Yes [name], MD  Blood Pressure Monitor KIT 1 Units by Does not apply route daily. 06/03/17  Yes Timmothy Euler, [place] D, PA-C  glipiZIDE (GLUCOTROL) 5 MG tablet Take 1 tablet (5 mg total) by mouth 2 (two) times daily before a meal. 07/18/18  Yes [name], MD  metoprolol succinate (TOPROL-XL) 25 MG 24 hr tablet Take 1 tablet (25 mg total) by mouth daily. 10/23/18  Yes [name], MD  pantoprazole (PROTONIX) 20 MG tablet Take 1 tablet (20 mg total) by mouth 2 (two) times daily before a meal. 07/24/18  Yes [name], MD  pioglitazone (ACTOS) 30 MG tablet Take 1 tablet (30 mg total) by mouth daily. 10/23/18  Yes [name], MD  rosuvastatin (CRESTOR) 40 MG tablet Take 1 tablet (40 mg total) by mouth daily. 07/18/18  Yes [name], MD  triamterene-hydrochlorothiazide (MAXZIDE-25) 37.5-25 MG tablet Take 1 tablet by mouth daily. 07/18/18  Yes Pamella Pert,  Lilia Argue, MD    Past Medical History:  Diagnosis Date  . Allergy   . Asthma   . Bronchitis   . Cataract   . DM2 (diabetes mellitus, type 2) ([place])   . GERD (gastroesophageal reflux disease)   . Heart murmur   . Hypertension     Past Surgical History:  Procedure Laterality Date  . CESAREAN SECTION    . COLONOSCOPY    . TONSILLECTOMY      Social History   Tobacco Use  . Smoking status: Never Smoker  . Smokeless tobacco: Never Used  Substance Use Topics  . Alcohol use: No    Family History  Problem Relation Age of Onset  . Cancer Mother        ovarian cancer  . Heart disease Mother   . Liver disease Mother   .  Hyperlipidemia Mother   . Stroke Mother   . Cancer Father        lung cancer  . Cancer Sister   . Hyperlipidemia Sister   . Cancer Sister   . Hyperlipidemia Sister   . Diabetes Maternal Aunt   . Colon cancer Maternal Aunt        x2 aunts  . Colon cancer Maternal Uncle        2 uncles  . Colon cancer Paternal Grandfather   . Esophageal cancer Neg Hx   . Rectal cancer Neg Hx   . Stomach cancer Neg Hx     Review of Systems  Constitutional: Negative for chills and fever.  Respiratory: Negative for cough and shortness of breath.   Cardiovascular: Negative for chest pain, palpitations and leg swelling.  Gastrointestinal: Negative for abdominal pain, nausea and vomiting.  per hpi   OBJECTIVE:  Today's Vitals   01/28/19 0820  BP: 122/80  Pulse: 87  Temp: 97.7 F (36.5 C)  SpO2: 99%  Weight: 148 lb (67.1 kg)  Height: 5' 4"  (1.626 m)   Body mass index is 25.4 kg/m.  Wt Readings from Last 3 Encounters:  01/28/19 148 lb (67.1 kg)  10/23/18 147 lb (66.7 kg)  10/01/18 147 lb (66.7 kg)    Physical Exam Vitals and nursing note reviewed.  Constitutional:      Appearance: She is well-developed.  HENT:     Head: Normocephalic and atraumatic.     Mouth/Throat:     Pharynx: No oropharyngeal exudate.  Eyes:     General: No scleral icterus.    Conjunctiva/sclera: Conjunctivae normal.     Pupils: Pupils are equal, round, and reactive to light.  Cardiovascular:     Rate and Rhythm: Normal rate and regular rhythm.     Heart sounds: Normal heart sounds. No murmur. No friction rub. No gallop.   Pulmonary:     Effort: Pulmonary effort is normal.     Breath sounds: Normal breath sounds. No wheezing or rales.  Musculoskeletal:     Cervical back: Neck supple.  Skin:    General: Skin is warm and dry.  Neurological:     Mental Status: She is alert and oriented to person, place, and time.     Results for orders placed or performed in visit on 01/28/19 (from the past 24  hour(s))  POCT glycosylated hemoglobin (Hb A1C)     Status: Abnormal   Collection Time: 01/28/19  8:59 AM  Result Value Ref Range   Hemoglobin A1C 7.6 (A) 4.0 - 5.6 %   HbA1c POC (<> result, manual entry)  HbA1c, POC (prediabetic range)     HbA1c, POC (controlled diabetic range)      No results found.   ASSESSMENT and PLAN  1. Type 2 diabetes mellitus with hyperglycemia, without long-term current use of insulin (HCC) Much improved. Cont with current meds and LFM - TSH - Lipid panel - CMET with GFR - POCT glycosylated hemoglobin (Hb A1C)  2. Essential hypertension Controlled. Continue current regime.  - TSH - Lipid panel - CMET with GFR - POCT glycosylated hemoglobin (Hb A1C) - amLODipine (NORVASC) 10 MG tablet; Take 1 tablet (10 mg total) by mouth daily.  3. Mixed hyperlipidemia Checking labs today, medications will be adjusted as needed.  - TSH - Lipid panel - CMET with GFR - POCT glycosylated hemoglobin (Hb A1C)  4. Encounter for screening mammogram for malignant neoplasm of breast - MM Digital Screening; Future  5. Osteopenia after menopause Discussed LFM recommendations  Other orders - glipiZIDE (GLUCOTROL) 5 MG tablet; Take 1 tablet (5 mg total) by mouth 2 (two) times daily before a meal.  Return in about 3 months (around 04/28/2019).    [name], MD Primary Care at [place],  [address] Ph.  [phone] Fax [phone]

## 2019-01-29 LAB — CMP14+EGFR
ALT: 16 IU/L (ref 0–32)
AST: 19 IU/L (ref 0–40)
Albumin/Globulin Ratio: 1.7 (ref 1.2–2.2)
Albumin: 4.8 g/dL (ref 3.8–4.8)
Alkaline Phosphatase: 87 IU/L (ref 39–117)
BUN/Creatinine Ratio: 24 (ref 12–28)
BUN: 23 mg/dL (ref 8–27)
Bilirubin Total: 0.4 mg/dL (ref 0.0–1.2)
CO2: 24 mmol/L (ref 20–29)
Calcium: 10.3 mg/dL (ref 8.7–10.3)
Chloride: 101 mmol/L (ref 96–106)
Creatinine, Ser: 0.95 mg/dL (ref 0.57–1.00)
GFR calc Af Amer: 71 mL/min/{1.73_m2} (ref >59)
GFR calc non Af Amer: 62 mL/min/{1.73_m2} (ref >59)
Globulin, Total: 2.9 g/dL (ref 1.5–4.5)
Glucose: 146 mg/dL — ABNORMAL HIGH (ref 65–99)
Potassium: 4.5 mmol/L (ref 3.5–5.2)
Sodium: 141 mmol/L (ref 134–144)
Total Protein: 7.7 g/dL (ref 6.0–8.5)

## 2019-01-29 LAB — TSH: TSH: 1.74 u[IU]/mL (ref 0.450–4.500)

## 2019-01-29 LAB — LIPID PANEL
Chol/HDL Ratio: 4.6 ratio — ABNORMAL HIGH (ref 0.0–4.4)
Cholesterol, Total: 222 mg/dL — ABNORMAL HIGH (ref 100–199)
HDL: 48 mg/dL (ref >39)
LDL Chol Calc (NIH): 134 mg/dL — ABNORMAL HIGH (ref 0–99)
Triglycerides: 227 mg/dL — ABNORMAL HIGH (ref 0–149)
VLDL Cholesterol Cal: 40 mg/dL (ref 5–40)

## 2019-03-06 DIAGNOSIS — H26491 Other secondary cataract, right eye: Secondary | ICD-10-CM | POA: Diagnosis not present

## 2019-03-06 DIAGNOSIS — H40013 Open angle with borderline findings, low risk, bilateral: Secondary | ICD-10-CM | POA: Diagnosis not present

## 2019-04-05 ENCOUNTER — Other Ambulatory Visit: Payer: Self-pay

## 2019-04-05 ENCOUNTER — Encounter: Payer: Self-pay | Admitting: Family Medicine

## 2019-04-05 ENCOUNTER — Ambulatory Visit (INDEPENDENT_AMBULATORY_CARE_PROVIDER_SITE_OTHER): Payer: PPO | Admitting: Family Medicine

## 2019-04-05 VITALS — BP 130/76 | HR 84 | Temp 98.0°F | Ht 64.0 in | Wt 153.0 lb

## 2019-04-05 DIAGNOSIS — R109 Unspecified abdominal pain: Secondary | ICD-10-CM

## 2019-04-05 DIAGNOSIS — M7551 Bursitis of right shoulder: Secondary | ICD-10-CM

## 2019-04-05 DIAGNOSIS — R1013 Epigastric pain: Secondary | ICD-10-CM | POA: Diagnosis not present

## 2019-04-05 DIAGNOSIS — I10 Essential (primary) hypertension: Secondary | ICD-10-CM | POA: Diagnosis not present

## 2019-04-05 DIAGNOSIS — M25511 Pain in right shoulder: Secondary | ICD-10-CM | POA: Diagnosis not present

## 2019-04-05 LAB — POCT URINALYSIS DIP (MANUAL ENTRY)
Bilirubin, UA: NEGATIVE
Glucose, UA: NEGATIVE mg/dL
Ketones, POC UA: NEGATIVE mg/dL
Leukocytes, UA: NEGATIVE
Nitrite, UA: NEGATIVE
Protein Ur, POC: NEGATIVE mg/dL
Spec Grav, UA: 1.02 (ref 1.010–1.025)
Urobilinogen, UA: 1 E.U./dL
pH, UA: 8 (ref 5.0–8.0)

## 2019-04-05 MED ORDER — METHOCARBAMOL 500 MG PO TABS: 500.0000 mg | ORAL_TABLET | Freq: Four times a day (QID) | ORAL | 1 refills | Status: DC

## 2019-04-05 MED ORDER — TRIAMTERENE-HCTZ 37.5-25 MG PO TABS: 1.0000 | ORAL_TABLET | Freq: Every day | ORAL | 1 refills | Status: DC

## 2019-04-05 MED ORDER — PANTOPRAZOLE SODIUM 20 MG PO TBEC: 20.0000 mg | DELAYED_RELEASE_TABLET | Freq: Every day | ORAL | 3 refills | Status: DC

## 2019-04-05 MED ORDER — DICLOFENAC SODIUM 1 % EX GEL: 2.0000 g | Freq: Three times a day (TID) | CUTANEOUS | 3 refills | Status: DC | PRN

## 2019-04-05 NOTE — Patient Instructions (Addendum)
Get some Voltaren gel and use it on your shoulder 2 or 3 times a day.  I am giving you a prescription for it, but many insurances refused to pay for it and you can buy it over-the-counter now.  Take Tylenol 500 mg (acetaminophen) 2 pills 3 times daily for pain  Take Robaxin (methocarbamol) 500 mg 1 pill 3 or 4 times daily as needed for muscle relaxant for shoulder  Do some gentle range of motion exercises of your shoulder.  If you keep having a lot of shoulder pain you might have to get an x-ray and cortisone injection in it.  Take the Protonix (pantoprazole) 1 daily for stomach acid reduction.  I think that even though your bowels are moving fairly well, I would encourage you to take a little of the MiraLAX 2 or 3 days a week to see if you can get yourself cleaned out a little and if that will help how your abdomen is feeling.  Referral is being made to Dr. Carlean Purl who did your colonoscopy 5 or 6 years ago.  He is a gastroenterologist.  If you are feeling remarkably better after using the Protonix and MiraLAX you may want to delay the referral, but if you are still having any persistent problems I would go ahead and try and see him.  Keep your regular appointment with Dr. Pamella Benitez for your routine care.  Plan to stay off work through next Wednesday and return Thursday.  Avoid lifting and straining.  I have given a handout on shoulder exercises as below, but I would start gently and gradually increase.  Shoulder Exercises Ask your health care provider which exercises are safe for you. Do exercises exactly as told by your health care provider and adjust them as directed. It is normal to feel mild stretching, pulling, tightness, or discomfort as you do these exercises. Stop right away if you feel sudden pain or your pain gets worse. Do not begin these exercises until told by your health care provider. Stretching exercises External rotation and abduction This exercise is sometimes called corner  stretch. This exercise rotates your arm outward (external rotation) and moves your arm out from your body (abduction). 1. Stand in a doorway with one of your feet slightly in front of the other. This is called a staggered stance. If you cannot reach your forearms to the door frame, stand facing a corner of a room. 2. Choose one of the following positions as told by your health care provider: ? Place your hands and forearms on the door frame above your head. ? Place your hands and forearms on the door frame at the height of your head. ? Place your hands on the door frame at the height of your elbows. 3. Slowly move your weight onto your front foot until you feel a stretch across your chest and in the front of your shoulders. Keep your head and chest upright and keep your abdominal muscles tight. 4. Hold for __________ seconds. 5. To release the stretch, shift your weight to your back foot. Repeat __________ times. Complete this exercise __________ times a day. Extension, standing 1. Stand and hold a broomstick, a cane, or a similar object behind your back. ? Your hands should be a little wider than shoulder width apart. ? Your palms should face away from your back. 2. Keeping your elbows straight and your shoulder muscles relaxed, move the stick away from your body until you feel a stretch in your shoulders (extension). ?  Avoid shrugging your shoulders while you move the stick. Keep your shoulder blades tucked down toward the middle of your back. 3. Hold for __________ seconds. 4. Slowly return to the starting position. Repeat __________ times. Complete this exercise __________ times a day. Range-of-motion exercises Pendulum  1. Stand near a wall or a surface that you can hold onto for balance. 2. Bend at the waist and let your left / right arm hang straight down. Use your other arm to support you. Keep your back straight and do not lock your knees. 3. Relax your left / right arm and shoulder  muscles, and move your hips and your trunk so your left / right arm swings freely. Your arm should swing because of the motion of your body, not because you are using your arm or shoulder muscles. 4. Keep moving your hips and trunk so your arm swings in the following directions, as told by your health care provider: ? Side to side. ? Forward and backward. ? In clockwise and counterclockwise circles. 5. Continue each motion for __________ seconds, or for as long as told by your health care provider. 6. Slowly return to the starting position. Repeat __________ times. Complete this exercise __________ times a day. Shoulder flexion, standing  1. Stand and hold a broomstick, a cane, or a similar object. Place your hands a little more than shoulder width apart on the object. Your left / right hand should be palm up, and your other hand should be palm down. 2. Keep your elbow straight and your shoulder muscles relaxed. Push the stick up with your healthy arm to raise your left / right arm in front of your body, and then over your head until you feel a stretch in your shoulder (flexion). ? Avoid shrugging your shoulder while you raise your arm. Keep your shoulder blade tucked down toward the middle of your back. 3. Hold for __________ seconds. 4. Slowly return to the starting position. Repeat __________ times. Complete this exercise __________ times a day. Shoulder abduction, standing 1. Stand and hold a broomstick, a cane, or a similar object. Place your hands a little more than shoulder width apart on the object. Your left / right hand should be palm up, and your other hand should be palm down. 2. Keep your elbow straight and your shoulder muscles relaxed. Push the object across your body toward your left / right side. Raise your left / right arm to the side of your body (abduction) until you feel a stretch in your shoulder. ? Do not raise your arm above shoulder height unless your health care provider  tells you to do that. ? If directed, raise your arm over your head. ? Avoid shrugging your shoulder while you raise your arm. Keep your shoulder blade tucked down toward the middle of your back. 3. Hold for __________ seconds. 4. Slowly return to the starting position. Repeat __________ times. Complete this exercise __________ times a day. Internal rotation  1. Place your left / right hand behind your back, palm up. 2. Use your other hand to dangle an exercise band, a towel, or a similar object over your shoulder. Grasp the band with your left / right hand so you are holding on to both ends. 3. Gently pull up on the band until you feel a stretch in the front of your left / right shoulder. The movement of your arm toward the center of your body is called internal rotation. ? Avoid shrugging your shoulder while  you raise your arm. Keep your shoulder blade tucked down toward the middle of your back. 4. Hold for __________ seconds. 5. Release the stretch by letting go of the band and lowering your hands. Repeat __________ times. Complete this exercise __________ times a day. Strengthening exercises External rotation  1. Sit in a stable chair without armrests. 2. Secure an exercise band to a stable object at elbow height on your left / right side. 3. Place a soft object, such as a folded towel or a small pillow, between your left / right upper arm and your body to move your elbow about 4 inches (10 cm) away from your side. 4. Hold the end of the exercise band so it is tight and there is no slack. 5. Keeping your elbow pressed against the soft object, slowly move your forearm out, away from your abdomen (external rotation). Keep your body steady so only your forearm moves. 6. Hold for __________ seconds. 7. Slowly return to the starting position. Repeat __________ times. Complete this exercise __________ times a day. Shoulder abduction  1. Sit in a stable chair without armrests, or stand  up. 2. Hold a __________ weight in your left / right hand, or hold an exercise band with both hands. 3. Start with your arms straight down and your left / right palm facing in, toward your body. 4. Slowly lift your left / right hand out to your side (abduction). Do not lift your hand above shoulder height unless your health care provider tells you that this is safe. ? Keep your arms straight. ? Avoid shrugging your shoulder while you do this movement. Keep your shoulder blade tucked down toward the middle of your back. 5. Hold for __________ seconds. 6. Slowly lower your arm, and return to the starting position. Repeat __________ times. Complete this exercise __________ times a day. Shoulder extension 1. Sit in a stable chair without armrests, or stand up. 2. Secure an exercise band to a stable object in front of you so it is at shoulder height. 3. Hold one end of the exercise band in each hand. Your palms should face each other. 4. Straighten your elbows and lift your hands up to shoulder height. 5. Step back, away from the secured end of the exercise band, until the band is tight and there is no slack. 6. Squeeze your shoulder blades together as you pull your hands down to the sides of your thighs (extension). Stop when your hands are straight down by your sides. Do not let your hands go behind your body. 7. Hold for __________ seconds. 8. Slowly return to the starting position. Repeat __________ times. Complete this exercise __________ times a day. Shoulder row 1. Sit in a stable chair without armrests, or stand up. 2. Secure an exercise band to a stable object in front of you so it is at waist height. 3. Hold one end of the exercise band in each hand. Position your palms so that your thumbs are facing the ceiling (neutral position). 4. Bend each of your elbows to a 90-degree angle (right angle) and keep your upper arms at your sides. 5. Step back until the band is tight and there is no  slack. 6. Slowly pull your elbows back behind you. 7. Hold for __________ seconds. 8. Slowly return to the starting position. Repeat __________ times. Complete this exercise __________ times a day. Shoulder press-ups  1. Sit in a stable chair that has armrests. Sit upright, with your feet flat on  the floor. 2. Put your hands on the armrests so your elbows are bent and your fingers are pointing forward. Your hands should be about even with the sides of your body. 3. Push down on the armrests and use your arms to lift yourself off the chair. Straighten your elbows and lift yourself up as much as you comfortably can. ? Move your shoulder blades down, and avoid letting your shoulders move up toward your ears. ? Keep your feet on the ground. As you get stronger, your feet should support less of your body weight as you lift yourself up. 4. Hold for __________ seconds. 5. Slowly lower yourself back into the chair. Repeat __________ times. Complete this exercise __________ times a day. Wall push-ups  1. Stand so you are facing a stable wall. Your feet should be about one arm-length away from the wall. 2. Lean forward and place your palms on the wall at shoulder height. 3. Keep your feet flat on the floor as you bend your elbows and lean forward toward the wall. 4. Hold for __________ seconds. 5. Straighten your elbows to push yourself back to the starting position. Repeat __________ times. Complete this exercise __________ times a day. This information is not intended to replace advice given to you by your health care provider. Make sure you discuss any questions you have with your health care provider. Document Revised: 05/11/2018 Document Reviewed: 02/16/2018 [employer] Patient Education  [employer].     If you have lab work done today you will be contacted with your lab results within the next 2 weeks.  If you have not heard from [place] then please contact us. The fastest way to get your  results is to register for My Chart.   IF you received an x-ray today, you will receive an invoice from [facility]. Please contact [facility] at [phone] with questions or concerns regarding your invoice.   IF you received labwork today, you will receive an invoice from [facility]. Please contact [facility] at [phone] with questions or concerns regarding your invoice.   Our billing staff will not be able to assist you with questions regarding bills from these companies.  You will be contacted with the lab results as soon as they are available. The fastest way to get your results is to activate your My Chart account. Instructions are located on the last page of this paperwork. If you have not heard from [place] regarding the results in 2 weeks, please contact this office.

## 2019-04-05 NOTE — Progress Notes (Signed)
Patient ID: Erin Benitez, female    DOB: 28-Jun-1950  Age: 69 y.o. MRN: [id number]  Chief Complaint  Patient presents with  . Abdominal Pain    low back pain   . R shoulder pain    x 1 week    Subjective:   69 year old patient of Dr. Ardyth Gal who comes in today with a couple of problems.  She has been having chronic abdominal pain.  She thought she was going to be referred to a gastroenterologist last time, but apparently that fell through the cracks.  She also thought she was going to get a refill on her PPI which apparently was not refilled.  She had a colonoscopy 6 years ago.  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  It hurts day and night.  She has to lift children several times every afternoon and that has strained her more.  She also plays with them out on the playground.  A little bit of rest of the shoulder will help at the most.  Her bowels generally act fairly well.  She takes MiraLAX at times and a tea that she used to treat her gut.  Current allergies, medications, problem list, past/family and social histories reviewed.  Objective:  BP 130/76   Pulse 84   Temp 98 F (36.7 C) (Oral)   Ht 5\' 4"  (1.626 m)   Wt 153 lb (69.4 kg)   SpO2 96%   BMI 26.26 kg/m   No major acute distress.  Pleasant lady alert and oriented.  Right shoulder is tender from the lower end of the trapezius down into the upper arm.  However the most tenderness is right at the subacromial area which has point tenderness to it.  Lower arm pain grip is good.  Range of motion is limited due to her pain.  Abdomen has active bowel sounds with a lot of gas.  A little 10 phonetic.  Soft without masses but a little distended.  No specific tenderness today.  She says is a little bit better to feeling today.  Assessment & Plan:   Assessment: 1. Stomach pain   2. Subacromial bursitis of  right shoulder joint   3. Pain in joint of right shoulder   4. Nonspecific abdominal pain   5. Dyspepsia   6. Essential hypertension       Plan: Discussed both problems.  Oral NSAIDs would be good for her shoulder but she cannot take them.  Did not give her injection yet because she is diabetic.  However if she keeps having pain should get an x-ray of the shoulder and probably a subacromial steroid injection.  I had make referral to gastroenterology since she has been wanting to go there.  Symptoms are fairly nonspecific.  She had I CT scan several years ago which showed some diverticulosis.  I did not do extensive other studies on her at this time.  Chemistries were good 2 months ago.  Her diabetic control seems to be improving over the last year.  Orders Placed This Encounter  Procedures  . Ambulatory referral to Gastroenterology    Referral Priority:   Routine    Referral Type:   Consultation    Referral Reason:   Specialty Services Required    Referred to Provider:   Gatha Mayer, MD    Number of Visits Requested:   1  .  POCT urinalysis dipstick    Meds ordered this encounter  Medications  . triamterene-hydrochlorothiazide (MAXZIDE-25) 37.5-25 MG tablet    Sig: Take 1 tablet by mouth daily.    Dispense:  90 tablet    Refill:  1  . pantoprazole (PROTONIX) 20 MG tablet    Sig: Take 1 tablet (20 mg total) by mouth daily.    Dispense:  30 tablet    Refill:  3  . diclofenac Sodium (VOLTAREN) 1 % GEL    Sig: Apply 2 g topically 3 (three) times daily as needed.    Dispense:  100 g    Refill:  3  . methocarbamol (ROBAXIN) 500 MG tablet    Sig: Take 1 tablet (500 mg total) by mouth 4 (four) times daily.    Dispense:  30 tablet    Refill:  1         Patient Instructions   Get some Voltaren gel and use it on your shoulder 2 or 3 times a day.  I am giving you a prescription for it, but many insurances refused to pay for it and you can buy it over-the-counter now.  Take  Tylenol 500 mg (acetaminophen) 2 pills 3 times daily for pain  Take Robaxin (methocarbamol) 500 mg 1 pill 3 or 4 times daily as needed for muscle relaxant for shoulder  Do some gentle range of motion exercises of your shoulder.  If you keep having a lot of shoulder pain you might have to get an x-ray and cortisone injection in it.  Take the Protonix (pantoprazole) 1 daily for stomach acid reduction.  I think that even though your bowels are moving fairly well, I would encourage you to take a little of the MiraLAX 2 or 3 days a week to see if you can get yourself cleaned out a little and if that will help how your abdomen is feeling.  Referral is being made to Dr. Carlean Purl who did your colonoscopy 5 or 6 years ago.  He is a gastroenterologist.  If you are feeling remarkably better after using the Protonix and MiraLAX you may want to delay the referral, but if you are still having any persistent problems I would go ahead and try and see him.  Keep your regular appointment with Dr. Pamella Pert for your routine care.  Plan to stay off work through next Wednesday and return Thursday.  Avoid lifting and straining.  I have given a handout on shoulder exercises as below, but I would start gently and gradually increase.  Shoulder Exercises Ask your health care provider which exercises are safe for you. Do exercises exactly as told by your health care provider and adjust them as directed. It is normal to feel mild stretching, pulling, tightness, or discomfort as you do these exercises. Stop right away if you feel sudden pain or your pain gets worse. Do not begin these exercises until told by your health care provider. Stretching exercises External rotation and abduction This exercise is sometimes called corner stretch. This exercise rotates your arm outward (external rotation) and moves your arm out from your body (abduction). 1. Stand in a doorway with one of your feet slightly in front of the other. This  is called a staggered stance. If you cannot reach your forearms to the door frame, stand facing a corner of a room. 2. Choose one of the following positions as told by your health care provider: ? Place your hands and forearms on the door frame above your  head. ? Place your hands and forearms on the door frame at the height of your head. ? Place your hands on the door frame at the height of your elbows. 3. Slowly move your weight onto your front foot until you feel a stretch across your chest and in the front of your shoulders. Keep your head and chest upright and keep your abdominal muscles tight. 4. Hold for __________ seconds. 5. To release the stretch, shift your weight to your back foot. Repeat __________ times. Complete this exercise __________ times a day. Extension, standing 1. Stand and hold a broomstick, a cane, or a similar object behind your back. ? Your hands should be a little wider than shoulder width apart. ? Your palms should face away from your back. 2. Keeping your elbows straight and your shoulder muscles relaxed, move the stick away from your body until you feel a stretch in your shoulders (extension). ? Avoid shrugging your shoulders while you move the stick. Keep your shoulder blades tucked down toward the middle of your back. 3. Hold for __________ seconds. 4. Slowly return to the starting position. Repeat __________ times. Complete this exercise __________ times a day. Range-of-motion exercises Pendulum  1. Stand near a wall or a surface that you can hold onto for balance. 2. Bend at the waist and let your left / right arm hang straight down. Use your other arm to support you. Keep your back straight and do not lock your knees. 3. Relax your left / right arm and shoulder muscles, and move your hips and your trunk so your left / right arm swings freely. Your arm should swing because of the motion of your body, not because you are using your arm or shoulder  muscles. 4. Keep moving your hips and trunk so your arm swings in the following directions, as told by your health care provider: ? Side to side. ? Forward and backward. ? In clockwise and counterclockwise circles. 5. Continue each motion for __________ seconds, or for as long as told by your health care provider. 6. Slowly return to the starting position. Repeat __________ times. Complete this exercise __________ times a day. Shoulder flexion, standing  1. Stand and hold a broomstick, a cane, or a similar object. Place your hands a little more than shoulder width apart on the object. Your left / right hand should be palm up, and your other hand should be palm down. 2. Keep your elbow straight and your shoulder muscles relaxed. Push the stick up with your healthy arm to raise your left / right arm in front of your body, and then over your head until you feel a stretch in your shoulder (flexion). ? Avoid shrugging your shoulder while you raise your arm. Keep your shoulder blade tucked down toward the middle of your back. 3. Hold for __________ seconds. 4. Slowly return to the starting position. Repeat __________ times. Complete this exercise __________ times a day. Shoulder abduction, standing 1. Stand and hold a broomstick, a cane, or a similar object. Place your hands a little more than shoulder width apart on the object. Your left / right hand should be palm up, and your other hand should be palm down. 2. Keep your elbow straight and your shoulder muscles relaxed. Push the object across your body toward your left / right side. Raise your left / right arm to the side of your body (abduction) until you feel a stretch in your shoulder. ? Do not raise your arm above  shoulder height unless your health care provider tells you to do that. ? If directed, raise your arm over your head. ? Avoid shrugging your shoulder while you raise your arm. Keep your shoulder blade tucked down toward the middle of  your back. 3. Hold for __________ seconds. 4. Slowly return to the starting position. Repeat __________ times. Complete this exercise __________ times a day. Internal rotation  1. Place your left / right hand behind your back, palm up. 2. Use your other hand to dangle an exercise band, a towel, or a similar object over your shoulder. Grasp the band with your left / right hand so you are holding on to both ends. 3. Gently pull up on the band until you feel a stretch in the front of your left / right shoulder. The movement of your arm toward the center of your body is called internal rotation. ? Avoid shrugging your shoulder while you raise your arm. Keep your shoulder blade tucked down toward the middle of your back. 4. Hold for __________ seconds. 5. Release the stretch by letting go of the band and lowering your hands. Repeat __________ times. Complete this exercise __________ times a day. Strengthening exercises External rotation  1. Sit in a stable chair without armrests. 2. Secure an exercise band to a stable object at elbow height on your left / right side. 3. Place a soft object, such as a folded towel or a small pillow, between your left / right upper arm and your body to move your elbow about 4 inches (10 cm) away from your side. 4. Hold the end of the exercise band so it is tight and there is no slack. 5. Keeping your elbow pressed against the soft object, slowly move your forearm out, away from your abdomen (external rotation). Keep your body steady so only your forearm moves. 6. Hold for __________ seconds. 7. Slowly return to the starting position. Repeat __________ times. Complete this exercise __________ times a day. Shoulder abduction  1. Sit in a stable chair without armrests, or stand up. 2. Hold a __________ weight in your left / right hand, or hold an exercise band with both hands. 3. Start with your arms straight down and your left / right palm facing in, toward your  body. 4. Slowly lift your left / right hand out to your side (abduction). Do not lift your hand above shoulder height unless your health care provider tells you that this is safe. ? Keep your arms straight. ? Avoid shrugging your shoulder while you do this movement. Keep your shoulder blade tucked down toward the middle of your back. 5. Hold for __________ seconds. 6. Slowly lower your arm, and return to the starting position. Repeat __________ times. Complete this exercise __________ times a day. Shoulder extension 1. Sit in a stable chair without armrests, or stand up. 2. Secure an exercise band to a stable object in front of you so it is at shoulder height. 3. Hold one end of the exercise band in each hand. Your palms should face each other. 4. Straighten your elbows and lift your hands up to shoulder height. 5. Step back, away from the secured end of the exercise band, until the band is tight and there is no slack. 6. Squeeze your shoulder blades together as you pull your hands down to the sides of your thighs (extension). Stop when your hands are straight down by your sides. Do not let your hands go behind your body. 7. Hold  for __________ seconds. 8. Slowly return to the starting position. Repeat __________ times. Complete this exercise __________ times a day. Shoulder row 1. Sit in a stable chair without armrests, or stand up. 2. Secure an exercise band to a stable object in front of you so it is at waist height. 3. Hold one end of the exercise band in each hand. Position your palms so that your thumbs are facing the ceiling (neutral position). 4. Bend each of your elbows to a 90-degree angle (right angle) and keep your upper arms at your sides. 5. Step back until the band is tight and there is no slack. 6. Slowly pull your elbows back behind you. 7. Hold for __________ seconds. 8. Slowly return to the starting position. Repeat __________ times. Complete this exercise __________  times a day. Shoulder press-ups  1. Sit in a stable chair that has armrests. Sit upright, with your feet flat on the floor. 2. Put your hands on the armrests so your elbows are bent and your fingers are pointing forward. Your hands should be about even with the sides of your body. 3. Push down on the armrests and use your arms to lift yourself off the chair. Straighten your elbows and lift yourself up as much as you comfortably can. ? Move your shoulder blades down, and avoid letting your shoulders move up toward your ears. ? Keep your feet on the ground. As you get stronger, your feet should support less of your body weight as you lift yourself up. 4. Hold for __________ seconds. 5. Slowly lower yourself back into the chair. Repeat __________ times. Complete this exercise __________ times a day. Wall push-ups  1. Stand so you are facing a stable wall. Your feet should be about one arm-length away from the wall. 2. Lean forward and place your palms on the wall at shoulder height. 3. Keep your feet flat on the floor as you bend your elbows and lean forward toward the wall. 4. Hold for __________ seconds. 5. Straighten your elbows to push yourself back to the starting position. Repeat __________ times. Complete this exercise __________ times a day. This information is not intended to replace advice given to you by your health care provider. Make sure you discuss any questions you have with your health care provider. Document Revised: 05/11/2018 Document Reviewed: 02/16/2018 Elsevier Patient Education  [employer].     If you have lab work done today you will be contacted with your lab results within the next 2 weeks.  If you have not heard from [place] then please contact us. The fastest way to get your results is to register for My Chart.   IF you received an x-ray today, you will receive an invoice from [facility]. Please contact [facility] at [phone] with  questions or concerns regarding your invoice.   IF you received labwork today, you will receive an invoice from [place]. Please contact [facility] at [phone] with questions or concerns regarding your invoice.   Our billing staff will not be able to assist you with questions regarding bills from these companies.  You will be contacted with the lab results as soon as they are available. The fastest way to get your results is to activate your My Chart account. Instructions are located on the last page of this paperwork. If you have not heard from [place] regarding the results in 2 weeks, please contact this office.         Return if symptoms worsen  or fail to improve, for Keep regular appointment with Dr. Pamella Pert.   Ruben Reason, MD 04/05/2019

## 2019-04-10 ENCOUNTER — Encounter: Payer: Self-pay | Admitting: Internal Medicine

## 2019-04-11 ENCOUNTER — Ambulatory Visit (INDEPENDENT_AMBULATORY_CARE_PROVIDER_SITE_OTHER): Payer: PPO

## 2019-04-11 ENCOUNTER — Other Ambulatory Visit: Payer: Self-pay

## 2019-04-11 ENCOUNTER — Encounter: Payer: Self-pay | Admitting: Family Medicine

## 2019-04-11 ENCOUNTER — Ambulatory Visit (INDEPENDENT_AMBULATORY_CARE_PROVIDER_SITE_OTHER): Payer: PPO | Admitting: Family Medicine

## 2019-04-11 VITALS — BP 140/80 | HR 76 | Temp 98.7°F | Ht 64.0 in | Wt 149.2 lb

## 2019-04-11 DIAGNOSIS — M25511 Pain in right shoulder: Secondary | ICD-10-CM

## 2019-04-11 DIAGNOSIS — M542 Cervicalgia: Secondary | ICD-10-CM | POA: Diagnosis not present

## 2019-04-11 MED ORDER — CYCLOBENZAPRINE HCL 10 MG PO TABS: 5.0000 mg | ORAL_TABLET | Freq: Three times a day (TID) | ORAL | 0 refills | Status: DC | PRN

## 2019-04-11 MED ORDER — METHYLPREDNISOLONE 4 MG PO TBPK: ORAL_TABLET | ORAL | 0 refills | Status: DC

## 2019-04-11 NOTE — Patient Instructions (Signed)
° ° ° °  If you have lab work done today you will be contacted with your lab results within the next 2 weeks.  If you have not heard from us then please contact us. The fastest way to get your results is to register for My Chart. ° ° °IF you received an x-ray today, you will receive an invoice from [facility]. Please contact [facility] at [phone] with questions or concerns regarding your invoice.  ° °IF you received labwork today, you will receive an invoice from [facility]. Please contact [facility] at [phone] with questions or concerns regarding your invoice.  ° °Our billing staff will not be able to assist you with questions regarding bills from these companies. ° °You will be contacted with the lab results as soon as they are available. The fastest way to get your results is to activate your My Chart account. Instructions are located on the last page of this paperwork. If you have not heard from us regarding the results in 2 weeks, please contact this office. °  ° ° ° °

## 2019-04-11 NOTE — Progress Notes (Signed)
3/11/202111:15 AM  Erin Benitez [birth year], 69 y.o., female [id number]  Chief Complaint  Patient presents with  . Pain    works for preschool, hit on right side by running child 3 wks ago, since incident continuing to lift kids. Extreme pain, needs work note to continue out of work. Meds given are not working    HPI:   Patient is a 69 y.o. female with past medical history significant for HTN, DM2, GERD, HLP, diverticulosis who presents today for right shoulder pain  Seen on march 5th for same issue by Dr Linna Darner - thought to be bursitis, conservative measures discussed She has been using ice/heat, diclofenac gel and APAP Methocarbamol is not helping  Pain started after an older school aged child ran into her right arm while she was preventing the child from running out the door after another child Pain mostly along flexor surface of forearm and arm, stabbing pain, numbness Pain with all ROM of shoulder Pain along anterior shoulder that radiates up into base of neck No pain with neck movement Works at daycare center, carrying infants Last day of work Thursday March 4th Right handed   Lab Results  Component Value Date   HGBA1C 7.6 (A) 01/28/2019   Lab Results  Component Value Date   CREATININE 0.95 01/28/2019   BUN 23 01/28/2019   NA 141 01/28/2019   K 4.5 01/28/2019   CL 101 01/28/2019   CO2 24 01/28/2019    Depression screen PHQ 2/9 04/11/2019 04/05/2019 01/28/2019  Decreased Interest 0 0 0  Down, Depressed, Hopeless 0 0 0  PHQ - 2 Score 0 0 0    Fall Risk  04/11/2019 04/05/2019 01/28/2019 10/23/2018 08/21/2018  Falls in the past year? 0 0 0 0 0  Number falls in past yr: 0 0 0 0 0  Injury with Fall? 0 0 0 0 0  Follow up - Falls evaluation completed - Falls evaluation completed -     Allergies  Allergen Reactions  . Lisinopril Swelling  . Peanut-Containing Drug Products Anaphylaxis, Hives, Swelling and Other (See Comments)    Pistachio's - swelling of the throat     . Tramadol Nausea And Vomiting    Headache & dizziness with n&v    Prior to Admission medications   Medication Sig Start Date Benitez Date Taking? Authorizing Provider  albuterol (VENTOLIN HFA) 108 (90 Base) MCG/ACT inhaler Inhale 2 puffs into the lungs every 6 (six) hours as needed for wheezing or shortness of breath. 05/28/18  Yes Hawks, Christy A, FNP  amLODipine (NORVASC) 10 MG tablet Take 1 tablet (10 mg total) by mouth daily. 01/28/19  Yes [name], MD  blood glucose meter kit and supplies Per insurance preference. Check glucose once a day, alternate between fasting and 2 hours after dinner. 07/18/18  Yes [name], MD  Blood Pressure Monitor KIT 1 Units by Does not apply route daily. 06/03/17  Yes Timmothy Euler, [place] D, PA-C  diclofenac Sodium (VOLTAREN) 1 % GEL Apply 2 g topically 3 (three) times daily as needed. 04/05/19  Yes Posey Boyer, MD  glipiZIDE (GLUCOTROL) 5 MG tablet Take 1 tablet (5 mg total) by mouth 2 (two) times daily before a meal. 01/28/19  Yes [name], MD  methocarbamol (ROBAXIN) 500 MG tablet Take 1 tablet (500 mg total) by mouth 4 (four) times daily. 04/05/19  Yes Posey Boyer, MD  metoprolol succinate (TOPROL-XL) 25 MG 24 hr tablet Take 1 tablet (25 mg total)  by mouth daily. 10/23/18  Yes [name], MD  pantoprazole (PROTONIX) 20 MG tablet Take 1 tablet (20 mg total) by mouth daily. 04/05/19  Yes Posey Boyer, MD  pioglitazone (ACTOS) 30 MG tablet Take 1 tablet (30 mg total) by mouth daily. 10/23/18  Yes [name], MD  rosuvastatin (CRESTOR) 40 MG tablet Take 1 tablet (40 mg total) by mouth daily. 07/18/18  Yes [name], MD  triamterene-hydrochlorothiazide (MAXZIDE-25) 37.5-25 MG tablet Take 1 tablet by mouth daily. 04/05/19  Yes Posey Boyer, MD    Past Medical History:  Diagnosis Date  . Allergy   . Asthma   . Bronchitis   . Cataract   . DM2 (diabetes mellitus, type 2) ([place])   . GERD (gastroesophageal reflux disease)     . Heart murmur   . Hypertension     Past Surgical History:  Procedure Laterality Date  . CESAREAN SECTION    . COLONOSCOPY    . TONSILLECTOMY      Social History   Tobacco Use  . Smoking status: Never Smoker  . Smokeless tobacco: Never Used  Substance Use Topics  . Alcohol use: No    Family History  Problem Relation Age of Onset  . Cancer Mother        ovarian cancer  . Heart disease Mother   . Liver disease Mother   . Hyperlipidemia Mother   . Stroke Mother   . Cancer Father        lung cancer  . Cancer Sister   . Hyperlipidemia Sister   . Cancer Sister   . Hyperlipidemia Sister   . Diabetes Maternal Aunt   . Colon cancer Maternal Aunt        x2 aunts  . Colon cancer Maternal Uncle        2 uncles  . Colon cancer Paternal Grandfather   . Esophageal cancer Neg Hx   . Rectal cancer Neg Hx   . Stomach cancer Neg Hx     ROS Per hpi  OBJECTIVE:  Today's Vitals   04/11/19 1106  BP: 140/80  Pulse: 76  Temp: 98.7 F (37.1 C)  SpO2: 98%  Weight: 149 lb 3.2 oz (67.7 kg)  Height: 5' 4"  (1.626 m)   Body mass index is 25.61 kg/m.   Physical Exam Vitals and nursing note reviewed.  Constitutional:      Appearance: She is well-developed.  HENT:     Head: Normocephalic and atraumatic.  Eyes:     General: No scleral icterus.    Conjunctiva/sclera: Conjunctivae normal.     Pupils: Pupils are equal, round, and reactive to light.  Neck:     Comments: Negative spurling Pulmonary:     Effort: Pulmonary effort is normal.  Musculoskeletal:     Right shoulder: Bony tenderness (AC joint) present. Decreased range of motion (flexion at 65, abduction at 90, post internal rotation to hip level). Normal strength. Normal pulse.     Right upper arm: Normal. No swelling or bony tenderness.     Right elbow: Normal.     Right wrist: Normal.     Cervical back: Full passive range of motion without pain. Muscular tenderness (distal trapezius, RIght pain and spasms)  present.     Comments: Post drop arch test, empty can, hawkins Guarded exam  Skin:    General: Skin is warm and dry.  Neurological:     Mental Status: She is alert and oriented to person, place,  and time.       No results found for this or any previous visit (from the past 24 hour(s)).  DG Cervical Spine Complete  Result Date: 04/11/2019 CLINICAL DATA:  Neck and right shoulder pain after being pushed by a child. EXAM: CERVICAL SPINE - COMPLETE 4+ VIEW COMPARISON:  Report dated 02/09/1999. FINDINGS: Degenerative changes at the C3-4 through C7-T1 levels. These include uncinate spurs producing moderate foraminal stenosis on the right at the C5-6 and C6-7 levels and mild foraminal stenosis on the right at the C3-4 level. Similar changes on the left producing moderate foraminal stenosis at the C6-7 level and mild foraminal stenosis at the C5-6 level. No prevertebral soft tissue swelling, fractures or subluxations. IMPRESSION: 1. No fracture or subluxation. 2. Degenerative changes, as described above. Electronically Signed   By: Claudie Revering M.D.   On: 04/11/2019 11:31   DG Shoulder Right  Result Date: 04/11/2019 CLINICAL DATA:  Neck and right shoulder pain after being pushed by a child. EXAM: RIGHT SHOULDER - 2+ VIEW COMPARISON:  None. FINDINGS: Mild distal acromial enthesophyte formation. No fracture or dislocation. IMPRESSION: No fracture or dislocation. Electronically Signed   By: Claudie Revering M.D.   On: 04/11/2019 11:32     ASSESSMENT and PLAN  1. Acute pain of right shoulder Favoring muscle strain, contusions given mechanism. Medrol pak, flexeril. Reviewed r/se/b. Monitor cbgs at home. Cont with ice/heat, APAP and diclofenac gel. Work excuse given as no light duty restrictions possible. Re-eval in 1 week with sports medicine.  - DG Cervical Spine Complete - DG Shoulder Right; Future  Other orders - cyclobenzaprine (FLEXERIL) 10 MG tablet; Take 0.5-1 tablets (5-10 mg total) by mouth 3  (three) times daily as needed for muscle spasms. - methylPREDNISolone (MEDROL DOSEPAK) 4 MG TBPK tablet; Take by mouth with food per packet instructions  Return in about 1 week (around 04/18/2019) for with Dr Carlota Raspberry for right shoulder pain.    [name], MD [facility], [place] [address] Ph.  [phone] Fax [phone]

## 2019-04-19 ENCOUNTER — Ambulatory Visit (INDEPENDENT_AMBULATORY_CARE_PROVIDER_SITE_OTHER): Payer: PPO | Admitting: Family Medicine

## 2019-04-19 ENCOUNTER — Other Ambulatory Visit: Payer: Self-pay

## 2019-04-19 ENCOUNTER — Encounter: Payer: Self-pay | Admitting: Family Medicine

## 2019-04-19 VITALS — BP 154/89 | HR 94 | Temp 97.4°F | Ht 64.0 in | Wt 156.0 lb

## 2019-04-19 DIAGNOSIS — M7501 Adhesive capsulitis of right shoulder: Secondary | ICD-10-CM | POA: Diagnosis not present

## 2019-04-19 DIAGNOSIS — M79601 Pain in right arm: Secondary | ICD-10-CM

## 2019-04-19 DIAGNOSIS — M62838 Other muscle spasm: Secondary | ICD-10-CM | POA: Diagnosis not present

## 2019-04-19 DIAGNOSIS — M25511 Pain in right shoulder: Secondary | ICD-10-CM

## 2019-04-19 MED ORDER — CYCLOBENZAPRINE HCL 10 MG PO TABS: 5.0000 mg | ORAL_TABLET | Freq: Three times a day (TID) | ORAL | 0 refills | Status: DC | PRN

## 2019-04-19 NOTE — Patient Instructions (Addendum)
  I refilled Flexeril for now, Tylenol as needed, I will refer you to orthopedics. shoulder sling as needed for comfort for now, but recommended that a few times per day for range of motion as tolerated after shoulder, elbow, and wrist. Please  follow-up in office or urgent care if any acute worsening symptoms.    If you have lab work done today you will be contacted with your lab results within the next 2 weeks.  If you have not heard from [place] then please contact us. The fastest way to get your results is to register for My Chart.   IF you received an x-ray today, you will receive an invoice from [facility]. Please contact [facility] at [phone] with questions or concerns regarding your invoice.   IF you received labwork today, you will receive an invoice from [place]. Please contact [facility] at [phone] with questions or concerns regarding your invoice.   Our billing staff will not be able to assist you with questions regarding bills from these companies.  You will be contacted with the lab results as soon as they are available. The fastest way to get your results is to activate your My Chart account. Instructions are located on the last page of this paperwork. If you have not heard from [place] regarding the results in 2 weeks, please contact this office.

## 2019-04-19 NOTE — Progress Notes (Signed)
Subjective:  Patient ID: Erin Benitez, female    DOB: 10/08/1950  Age: 69 y.o. MRN: [id number]  CC:  Chief Complaint  Patient presents with  . Follow-up    on R shoulder pain. pain is the same in the shoulder . pt states some improvment in her R wrist and arm pain. current pain level is a 7/10. pain is a stinging and throbing sensation.     HPI Erin Benitez presents for   Right shoulder pain Follow-up from visit with Dr. Pamella Pert March 11.  Previously treated by Dr. Linna Darner March 5.  Initially thought to be bursitis.  Initial injury was from being hit on the right side by a running child 3 weeks prior.  Pain was from shoulder to flexor surface of forearm, stabbing, numbness sensation.  Pain with range of motion of the shoulder.  Pain of anterior shoulder that radiated to the base of the neck.  Worked in a daycare center with symptoms.  Right-hand-dominant.  Initially treated with diclofenac gel, acetaminophen, methocarbamol without much relief.  C-spine and shoulder x-rays reviewed, degenerative changes noted in the cervical spine but no fracture or subluxation, shoulder x-ray without fracture or subluxation, mild distal acromial enthesophyte formation. Treated with Flexeril, Medrol Dosepak on March 11.  Work note provided.  Shoulder not any better. Arm is a little better with muscle relaxant (tid - denies problems with sedation). Finished medrol. Only improved wrist/arm - able to hold objects better. Still unable to write. 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    History Patient Active Problem List   Diagnosis Date Noted  . Type 2 diabetes mellitus without complication, without long-term current use of insulin ([place]) 09/06/2017  . Diverticulosis of colon without hemorrhage 03/09/2015  . Atypical chest pain 03/09/2015  . Hyperlipidemia 03/09/2015  . Gastroesophageal reflux disease without esophagitis 03/09/2015  . HTN  (hypertension) 04/01/2013  . Asthma 04/01/2013   Past Medical History:  Diagnosis Date  . Allergy   . Asthma   . Bronchitis   . Cataract   . DM2 (diabetes mellitus, type 2) ([place])   . GERD (gastroesophageal reflux disease)   . Heart murmur   . Hypertension    Past Surgical History:  Procedure Laterality Date  . CESAREAN SECTION    . COLONOSCOPY    . TONSILLECTOMY     Allergies  Allergen Reactions  . Lisinopril Swelling  . Peanut-Containing Drug Products Anaphylaxis, Hives, Swelling and Other (See Comments)    Pistachio's - swelling of the throat     . Tramadol Nausea And Vomiting    Headache & dizziness with n&v   Prior to Admission medications   Medication Sig Start Date End Date Taking? Authorizing Provider  albuterol (VENTOLIN HFA) 108 (90 Base) MCG/ACT inhaler Inhale 2 puffs into the lungs every 6 (six) hours as needed for wheezing or shortness of breath. 05/28/18  Yes Hawks, Christy A, FNP  amLODipine (NORVASC) 10 MG tablet Take 1 tablet (10 mg total) by mouth daily. 01/28/19  Yes [name], MD  blood glucose meter kit and supplies Per insurance preference. Check glucose once a day, alternate between fasting and 2 hours after dinner. 07/18/18  Yes [name], MD  Blood Pressure Monitor KIT 1 Units by Does not apply route daily. 06/03/17  Yes Timmothy Euler, [place] D, PA-C  cyclobenzaprine (FLEXERIL) 10 MG tablet Take 0.5-1 tablets (5-10 mg total) by mouth 3 (  three) times daily as needed for muscle spasms. 04/11/19  Yes [name], MD  diclofenac Sodium (VOLTAREN) 1 % GEL Apply 2 g topically 3 (three) times daily as needed. 04/05/19  Yes Posey Boyer, MD  glipiZIDE (GLUCOTROL) 5 MG tablet Take 1 tablet (5 mg total) by mouth 2 (two) times daily before a meal. 01/28/19  Yes [name], MD  methylPREDNISolone (MEDROL DOSEPAK) 4 MG TBPK tablet Take by mouth with food per packet instructions 04/11/19  Yes [name], MD  metoprolol succinate (TOPROL-XL) 25 MG  24 hr tablet Take 1 tablet (25 mg total) by mouth daily. 10/23/18  Yes [name], MD  pantoprazole (PROTONIX) 20 MG tablet Take 1 tablet (20 mg total) by mouth daily. 04/05/19  Yes Posey Boyer, MD  pioglitazone (ACTOS) 30 MG tablet Take 1 tablet (30 mg total) by mouth daily. 10/23/18  Yes [name], MD  rosuvastatin (CRESTOR) 40 MG tablet Take 1 tablet (40 mg total) by mouth daily. 07/18/18  Yes [name], MD  triamterene-hydrochlorothiazide (MAXZIDE-25) 37.5-25 MG tablet Take 1 tablet by mouth daily. 04/05/19  Yes Posey Boyer, MD   Social History   Socioeconomic History  . Marital status: Single    Spouse name: Not on file  . Number of children: 1  . Years of education: Not on file  . Highest education level: Not on file  Occupational History  . Occupation: Educator  Tobacco Use  . Smoking status: Never Smoker  . Smokeless tobacco: Never Used  Substance and Sexual Activity  . Alcohol use: No  . Drug use: No  . Sexual activity: Yes  Other Topics Concern  . Not on file  Social History Narrative   She reports she is single. She has 1 daughter. She is an Tourist information centre manager. 2 caffeinated beverages daily.   Social Determinants of Health   Financial Resource Strain:   . Difficulty of Paying Living Expenses:   Food Insecurity:   . Worried About Charity fundraiser in the Last Year:   . Arboriculturist in the Last Year:   Transportation Needs:   . Film/video editor (Medical):   Marland Kitchen Lack of Transportation (Non-Medical):   Physical Activity:   . Days of Exercise per Week:   . Minutes of Exercise per Session:   Stress:   . Feeling of Stress :   Social Connections:   . Frequency of Communication with Friends and Family:   . Frequency of Social Gatherings with Friends and Family:   . Attends Religious Services:   . Active Member of Clubs or Organizations:   . Attends Archivist Meetings:   Marland Kitchen Marital Status:   Intimate Partner Violence:   . Fear of  Current or Ex-Partner:   . Emotionally Abused:   Marland Kitchen Physically Abused:   . Sexually Abused:     Review of Systems Per HPI  Objective:   Vitals:   04/19/19 1423  BP: (!) 154/89  Pulse: 94  Temp: (!) 97.4 F (36.3 C)  TempSrc: Temporal  SpO2: 98%  Weight: 156 lb (70.8 kg)  Height: 5' 4"  (1.626 m)     Physical Exam Vitals reviewed.  Constitutional:      General: She is not in acute distress.    Appearance: She is well-developed.  HENT:     Head: Normocephalic and atraumatic.  Cardiovascular:     Rate and Rhythm: Normal rate.  Pulmonary:  Effort: Pulmonary effort is normal.  Musculoskeletal:     Comments: C-spine, decreased rotation, lateral flexion to the right with some tingling sensation to the upper neck paraspinals only, denies arm radiation.  No midline bony tenderness.  No rash  Right shoulder, guarded exam, active and passive range of motion to 80 degrees to 90 degrees of abduction and flexion due to discomfort.  Diffusely tender across the anterior shoulder to the [facility] joint, posterior shoulder to the upper trapezius, anterior upper arm over the biceps greater than triceps area.  Elbow full range of motion, no bony tenderness Forearm no bony tenderness, minimal discomfort over the upper forearm musculature  Wrist no bony tenderness, hand no bony tenderness, neurovascular intact distally.  Neurological:     Mental Status: She is alert and oriented to person, place, and time.        Assessment & Plan:  Erin Benitez is a 69 y.o. female . Pain in joint of right shoulder - Plan: Ambulatory referral to Orthopedic Surgery, cyclobenzaprine (FLEXERIL) 10 MG tablet, Sling immobilizer  Adhesive capsulitis of right shoulder - Plan: Ambulatory referral to Orthopedic Surgery, cyclobenzaprine (FLEXERIL) 10 MG tablet  Neck muscle spasm - Plan: cyclobenzaprine (FLEXERIL) 10 MG tablet, Sling immobilizer  Right arm pain - Plan: Ambulatory referral to Orthopedic Surgery,  Sling immobilizer  Previous imaging noted.  Persistent shoulder discomfort with some radiation to arm but no focal bony tenderness of elbow/forearm/wrist.  Repeat imaging was deferred.  Guarded exam with both active and passive motion at shoulder, suspect possible adhesive capsulitis.  Differential includes rotator cuff tear.  Also appears to have component of paraspinal spasm but do not think that cervical source is the only cause of shoulder/arm symptoms.  -Continue Flexeril as needed for spasm, shoulder sling provided for comfort but range of motion as tolerated few times per day discussed.  Refer to orthopedics to decide on advanced imaging with MRI versus physical therapy versus injection.  RTC precautions if acutely worse.  Meds ordered this encounter  Medications  . cyclobenzaprine (FLEXERIL) 10 MG tablet    Sig: Take 0.5-1 tablets (5-10 mg total) by mouth 3 (three) times daily as needed for muscle spasms.    Dispense:  30 tablet    Refill:  0   Patient Instructions    I refilled Flexeril for now, Tylenol as needed, I will refer you to orthopedics. shoulder sling as needed for comfort for now, but recommended that a few times per day for range of motion as tolerated after shoulder, elbow, and wrist. Please  follow-up in office or urgent care if any acute worsening symptoms.    If you have lab work done today you will be contacted with your lab results within the next 2 weeks.  If you have not heard from [place] then please contact us. The fastest way to get your results is to register for My Chart.   IF you received an x-ray today, you will receive an invoice from [facility]. Please contact [facility] at [phone] with questions or concerns regarding your invoice.   IF you received labwork today, you will receive an invoice from [place]. Please contact [facility] at [phone] with questions or concerns regarding your invoice.   Our billing staff will not be able to  assist you with questions regarding bills from these companies.  You will be contacted with the lab results as soon as they are available. The fastest way to get your results is to activate your My Chart  account. Instructions are located on the last page of this paperwork. If you have not heard from [place] regarding the results in 2 weeks, please contact this office.         Signed, Merri Ray, MD [facility]

## 2019-04-20 ENCOUNTER — Encounter: Payer: Self-pay | Admitting: Family Medicine

## 2019-04-25 ENCOUNTER — Other Ambulatory Visit: Payer: Self-pay

## 2019-04-25 ENCOUNTER — Ambulatory Visit (INDEPENDENT_AMBULATORY_CARE_PROVIDER_SITE_OTHER): Payer: Self-pay | Admitting: Orthopedic Surgery

## 2019-04-25 ENCOUNTER — Encounter: Payer: Self-pay | Admitting: Orthopedic Surgery

## 2019-04-25 DIAGNOSIS — M25511 Pain in right shoulder: Secondary | ICD-10-CM

## 2019-04-26 ENCOUNTER — Encounter: Payer: Self-pay | Admitting: Orthopedic Surgery

## 2019-04-26 NOTE — Progress Notes (Signed)
Office Visit Note   Patient: Erin Benitez           Date of Birth: November 18, 1950           MRN: [id number] Visit Date: 04/25/2019 Requested by: Wendie Agreste, MD [address] [place],  [place] [address] PCP: [name], MD  Subjective: Chief Complaint  Patient presents with  . Right Shoulder - Pain  . Neck - Pain    HPI: Erin Benitez is a 69 y.o. female who presents to the office complaining of right arm pain.  Patient notes that about 4 weeks ago patient was planning to interact with a large child.  The child was running and accidentally ran into the patient's right shoulder.  She notes that her arm "went dead" and she had difficulty lifting it.  She notes shooting pain from her shoulder to her fingers.  Pain was waking her up at night and she notes occasional numbness/tingling.  She had some neck pain that has now resolved.  She was seen by her primary care physician who ordered radiographs of the right shoulder.  These turned out to be negative for any acute injury.  She works as a Pharmacist, hospital and was trying to work through it but had to stop due to pain.  She recently began taking a steroid Dosepak which has significantly improved her pain.  Currently her right shoulder pain only radiates to the elbow and is rated a 3-4 out of 10 whereas a week ago it was 8 out of 10.  She finishes her steroid Dosepak tomorrow.  She has no surgical history regarding her shoulder or neck.  She has never had an MRI before..                ROS:  All systems reviewed are negative as they relate to the chief complaint within the history of present illness.  Patient denies fevers or chills.  Assessment & Plan: Visit Diagnoses:  1. Arthralgia of right acromioclavicular joint     Plan: Patient is a 70 year old female who presents complaint of right arm pain.  This is following a collision by a large child into the patient's shoulder.  Radiographs were reviewed from the PCP office.  Impression from exam is  that patient's pain is likely originating from the [facility] joint.  However, certain aspects of her history are concerning for possible radicular symptoms from her cervical spine.  Steroid Dosepak has been helping her symptoms.  Plan for patient to finish the Dosepak tomorrow and follow-up with the office in 3 weeks for clinical recheck.  At that time we will decide for or against an [facility] joint injection versus shoulder or C-spine MRI.  Patient agreed with this plan and will follow up in 3 weeks.  Follow-Up Instructions: No follow-ups on file.   Orders:  No orders of the defined types were placed in this encounter.  No orders of the defined types were placed in this encounter.     Procedures: No procedures performed   Clinical Data: No additional findings.  Objective: Vital Signs: There were no vitals taken for this visit.  Physical Exam:  Constitutional: Patient appears well-developed HEENT:  Head: Normocephalic Eyes:EOM are normal Neck: Normal range of motion Cardiovascular: Normal rate Pulmonary/chest: Effort normal Neurologic: Patient is alert Skin: Skin is warm Psychiatric: Patient has normal mood and affect  Ortho Exam:  Right shoulder Exam Tender to palpation over the right AC joint.  Positive crossarm test. Able to fully  forward flex and abduct shoulder overhead No loss of ER relative to the other shoulder.  Good endpoint with ER Good subscapularis, supraspinatus, and infraspinatus strength Negative Hawkins impingement 5/5 grip strength, forearm pronation/supination, and bicep strength   No significant pain elicited with cervical spine range of motion.  5/5 motor strength of the bilateral grip strength, pronation, supination, finger abduction, bicep, tricep, deltoid  Specialty Comments:  No specialty comments available.  Imaging: No results found.   PMFS History: Patient Active Problem List   Diagnosis Date Noted  . Type 2 diabetes mellitus without complication,  without long-term current use of insulin ([place]) 09/06/2017  . Diverticulosis of colon without hemorrhage 03/09/2015  . Atypical chest pain 03/09/2015  . Hyperlipidemia 03/09/2015  . Gastroesophageal reflux disease without esophagitis 03/09/2015  . HTN (hypertension) 04/01/2013  . Asthma 04/01/2013   Past Medical History:  Diagnosis Date  . Allergy   . Asthma   . Bronchitis   . Cataract   . Diverticulosis   . DM2 (diabetes mellitus, type 2) ([place])   . GERD (gastroesophageal reflux disease)   . Heart murmur   . Hyperplastic colon polyp   . Hypertension     Family History  Problem Relation Age of Onset  . Cancer Mother        ovarian cancer  . Heart disease Mother   . Liver disease Mother   . Hyperlipidemia Mother   . Stroke Mother   . Cancer Father        lung cancer  . Cancer Sister   . Hyperlipidemia Sister   . Cancer Sister   . Hyperlipidemia Sister   . Diabetes Maternal Aunt   . Colon cancer Maternal Aunt        x2 aunts  . Colon cancer Maternal Uncle        2 uncles  . Colon cancer Paternal Grandfather   . Esophageal cancer Neg Hx   . Rectal cancer Neg Hx   . Stomach cancer Neg Hx     Past Surgical History:  Procedure Laterality Date  . CESAREAN SECTION    . COLONOSCOPY    . TONSILLECTOMY     Social History   Occupational History  . Occupation: Educator  Tobacco Use  . Smoking status: Never Smoker  . Smokeless tobacco: Never Used  Substance and Sexual Activity  . Alcohol use: No  . Drug use: No  . Sexual activity: Yes

## 2019-04-29 ENCOUNTER — Encounter: Payer: Self-pay | Admitting: Family Medicine

## 2019-04-29 ENCOUNTER — Ambulatory Visit (INDEPENDENT_AMBULATORY_CARE_PROVIDER_SITE_OTHER): Payer: PPO | Admitting: Family Medicine

## 2019-04-29 ENCOUNTER — Telehealth: Payer: Self-pay | Admitting: *Deleted

## 2019-04-29 ENCOUNTER — Other Ambulatory Visit: Payer: Self-pay

## 2019-04-29 VITALS — BP 130/75 | HR 92 | Temp 98.0°F | Ht 64.0 in | Wt 153.0 lb

## 2019-04-29 DIAGNOSIS — I10 Essential (primary) hypertension: Secondary | ICD-10-CM

## 2019-04-29 DIAGNOSIS — M25511 Pain in right shoulder: Secondary | ICD-10-CM | POA: Diagnosis not present

## 2019-04-29 DIAGNOSIS — E782 Mixed hyperlipidemia: Secondary | ICD-10-CM

## 2019-04-29 DIAGNOSIS — E1165 Type 2 diabetes mellitus with hyperglycemia: Secondary | ICD-10-CM

## 2019-04-29 NOTE — Telephone Encounter (Signed)
Key: [id number]  CYCLOBENZAPRINE

## 2019-04-29 NOTE — Patient Instructions (Signed)
° ° ° °  If you have lab work done today you will be contacted with your lab results within the next 2 weeks.  If you have not heard from us then please contact us. The fastest way to get your results is to register for My Chart. ° ° °IF you received an x-ray today, you will receive an invoice from [facility]. Please contact [facility] at [phone] with questions or concerns regarding your invoice.  ° °IF you received labwork today, you will receive an invoice from [facility]. Please contact [facility] at [phone] with questions or concerns regarding your invoice.  ° °Our billing staff will not be able to assist you with questions regarding bills from these companies. ° °You will be contacted with the lab results as soon as they are available. The fastest way to get your results is to activate your My Chart account. Instructions are located on the last page of this paperwork. If you have not heard from us regarding the results in 2 weeks, please contact this office. °  ° ° ° °

## 2019-04-29 NOTE — Progress Notes (Signed)
3/29/20218:54 AM  Erin Benitez 02-17-1950, 69 y.o., female [id number]  Chief Complaint  Patient presents with  . R shoulder pain    7/10 persistent made worse by certain movements/ made better by steroids or stronger pain meds-      HPI:   Patient is a 69 y.o. female with past medical history significant for HTN, DM2, GERD, HLP, diverticulosis who presents today for routiine followup  Saw ortho for right shoulder pain 4 days ago - [facility] joint vs radicular pain, pred dose pak, followup in 3 weeks Pain is slight better, better able to move her arm  She would like to return to work on light duty  Has not been checking sugars Denies any increased thirst or urination She denies any sx hypogkycemia She continues to work on her diet  She has an appointment with endo on April 15th Continues to take actos and glipizide Denies any numbness or tingling in her feet Last eye exam per patient in feb 2021 - has appt in April 2021 for further eval of cataract  She her amlodipine, lisinopril-hctz, BB, statin daily wo issues  She does not take PPI daily   Lab Results  Component Value Date   HGBA1C 7.6 (A) 01/28/2019   HGBA1C 9.0 (A) 10/23/2018   HGBA1C 12.0 (A) 07/18/2018   Lab Results  Component Value Date   LDLCALC 134 (H) 01/28/2019   CREATININE 0.95 01/28/2019    Depression screen PHQ 2/9 04/29/2019 04/19/2019 04/11/2019  Decreased Interest 0 0 0  Down, Depressed, Hopeless 0 0 0  PHQ - 2 Score 0 0 0    Fall Risk  04/29/2019 04/19/2019 04/11/2019 04/05/2019 01/28/2019  Falls in the past year? 0 0 0 0 0  Number falls in past yr: 0 - 0 0 0  Injury with Fall? 0 - 0 0 0  Follow up Falls evaluation completed Falls evaluation completed - Falls evaluation completed -     Allergies  Allergen Reactions  . Lisinopril Swelling  . Peanut-Containing Drug Products Anaphylaxis, Hives, Swelling and Other (See Comments)    Pistachio's - swelling of the throat     . Tramadol Nausea And Vomiting     Headache & dizziness with n&v    Prior to Admission medications   Medication Sig Start Date Benitez Date Taking? Authorizing Provider  albuterol (VENTOLIN HFA) 108 (90 Base) MCG/ACT inhaler Inhale 2 puffs into the lungs every 6 (six) hours as needed for wheezing or shortness of breath. 05/28/18  Yes Hawks, Christy A, FNP  amLODipine (NORVASC) 10 MG tablet Take 1 tablet (10 mg total) by mouth daily. 01/28/19  Yes [name], MD  blood glucose meter kit and supplies Per insurance preference. Check glucose once a day, alternate between fasting and 2 hours after dinner. 07/18/18  Yes [name], MD  Blood Pressure Monitor KIT 1 Units by Does not apply route daily. 06/03/17  Yes Timmothy Euler, [place] D, PA-C  cyclobenzaprine (FLEXERIL) 10 MG tablet Take 0.5-1 tablets (5-10 mg total) by mouth 3 (three) times daily as needed for muscle spasms. 04/19/19  Yes Wendie Agreste, MD  diclofenac Sodium (VOLTAREN) 1 % GEL Apply 2 g topically 3 (three) times daily as needed. 04/05/19  Yes Posey Boyer, MD  glipiZIDE (GLUCOTROL) 5 MG tablet Take 1 tablet (5 mg total) by mouth 2 (two) times daily before a meal. 01/28/19  Yes [name], MD  methylPREDNISolone (MEDROL DOSEPAK) 4 MG TBPK tablet Take by mouth  with food per packet instructions 04/11/19  Yes [name], MD  metoprolol succinate (TOPROL-XL) 25 MG 24 hr tablet Take 1 tablet (25 mg total) by mouth daily. 10/23/18  Yes [name], MD  pantoprazole (PROTONIX) 20 MG tablet Take 1 tablet (20 mg total) by mouth daily. 04/05/19  Yes Posey Boyer, MD  pioglitazone (ACTOS) 30 MG tablet Take 1 tablet (30 mg total) by mouth daily. 10/23/18  Yes [name], MD  rosuvastatin (CRESTOR) 40 MG tablet Take 1 tablet (40 mg total) by mouth daily. 07/18/18  Yes [name], MD  triamterene-hydrochlorothiazide (MAXZIDE-25) 37.5-25 MG tablet Take 1 tablet by mouth daily. 04/05/19  Yes Posey Boyer, MD  methocarbamol (ROBAXIN) 500 MG tablet   04/23/19   [provider]    Past Medical History:  Diagnosis Date  . Allergy   . Asthma   . Bronchitis   . Cataract   . Diverticulosis   . DM2 (diabetes mellitus, type 2) ([place])   . GERD (gastroesophageal reflux disease)   . Heart murmur   . Hyperplastic colon polyp   . Hypertension     Past Surgical History:  Procedure Laterality Date  . CESAREAN SECTION    . COLONOSCOPY    . TONSILLECTOMY      Social History   Tobacco Use  . Smoking status: Never Smoker  . Smokeless tobacco: Never Used  Substance Use Topics  . Alcohol use: No    Family History  Problem Relation Age of Onset  . Cancer Mother        ovarian cancer  . Heart disease Mother   . Liver disease Mother   . Hyperlipidemia Mother   . Stroke Mother   . Cancer Father        lung cancer  . Cancer Sister   . Hyperlipidemia Sister   . Cancer Sister   . Hyperlipidemia Sister   . Diabetes Maternal Aunt   . Colon cancer Maternal Aunt        x2 aunts  . Colon cancer Maternal Uncle        2 uncles  . Colon cancer Paternal Grandfather   . Esophageal cancer Neg Hx   . Rectal cancer Neg Hx   . Stomach cancer Neg Hx     Review of Systems  Constitutional: Negative for chills and fever.  Respiratory: Negative for cough and shortness of breath.   Cardiovascular: Negative for chest pain, palpitations and leg swelling.  Gastrointestinal: Negative for abdominal pain, nausea and vomiting.   Per hpi  OBJECTIVE:  Today's Vitals   04/29/19 0831 04/29/19 0835  BP: (!) 152/82 130/75  Pulse: 92   Temp: 98 F (36.7 C)   SpO2: 99%   Weight: 153 lb (69.4 kg)   Height: 5' 4"  (1.626 m)    Body mass index is 26.26 kg/m.   Physical Exam Vitals and nursing note reviewed.  Constitutional:      Appearance: She is well-developed.  HENT:     Head: Normocephalic and atraumatic.     Mouth/Throat:     Pharynx: No oropharyngeal exudate.  Eyes:     General: No scleral icterus.    Conjunctiva/sclera:  Conjunctivae normal.     Pupils: Pupils are equal, round, and reactive to light.  Cardiovascular:     Rate and Rhythm: Normal rate and regular rhythm.     Heart sounds: Normal heart sounds. No murmur. No friction rub. No gallop.   Pulmonary:  Effort: Pulmonary effort is normal.     Breath sounds: Normal breath sounds. No wheezing, rhonchi or rales.  Musculoskeletal:     Cervical back: Neck supple.     Right lower leg: No edema.     Left lower leg: No edema.  Skin:    General: Skin is warm and dry.  Neurological:     Mental Status: She is alert and oriented to person, place, and time.     No results found for this or any previous visit (from the past 24 hour(s)).  No results found.   ASSESSMENT and PLAN  1. Type 2 diabetes mellitus with hyperglycemia, without long-term current use of insulin ([place]) Checking labs today, medications will be adjusted as needed. Cont working on LFM - Hemoglobin A1c - CMP14+EGFR - Lipid panel  2. Mixed hyperlipidemia Checking labs today, medications will be adjusted as needed.  - CMP14+EGFR - Lipid panel  3. Essential hypertension Controlled. Continue current regime.  - CMP14+EGFR - Lipid panel  4. Pain in joint of right shoulder Slowly improved. Letter for return to work on light duty, ie no lifting given, followup with ortho as scheduled  Other orders - methocarbamol (ROBAXIN) 500 MG tablet  Return in about 3 months (around 07/30/2019).    [name], MD Primary Care at [facility], [place] [address] Ph.  [phone] Fax [phone]

## 2019-04-30 LAB — CMP14+EGFR
ALT: 17 IU/L (ref 0–32)
AST: 13 IU/L (ref 0–40)
Albumin/Globulin Ratio: 1.8 (ref 1.2–2.2)
Albumin: 4.4 g/dL (ref 3.8–4.8)
Alkaline Phosphatase: 85 IU/L (ref 39–117)
BUN/Creatinine Ratio: 26 (ref 12–28)
BUN: 22 mg/dL (ref 8–27)
Bilirubin Total: 0.5 mg/dL (ref 0.0–1.2)
CO2: 25 mmol/L (ref 20–29)
Calcium: 9.8 mg/dL (ref 8.7–10.3)
Chloride: 100 mmol/L (ref 96–106)
Creatinine, Ser: 0.85 mg/dL (ref 0.57–1.00)
GFR calc Af Amer: 81 mL/min/{1.73_m2} (ref >59)
GFR calc non Af Amer: 71 mL/min/{1.73_m2} (ref >59)
Globulin, Total: 2.5 g/dL (ref 1.5–4.5)
Glucose: 172 mg/dL — ABNORMAL HIGH (ref 65–99)
Potassium: 4.2 mmol/L (ref 3.5–5.2)
Sodium: 141 mmol/L (ref 134–144)
Total Protein: 6.9 g/dL (ref 6.0–8.5)

## 2019-04-30 LAB — HEMOGLOBIN A1C
Est. average glucose Bld gHb Est-mCnc: 174 mg/dL
Hgb A1c MFr Bld: 7.7 % — ABNORMAL HIGH (ref 4.8–5.6)

## 2019-04-30 LAB — LIPID PANEL
Chol/HDL Ratio: 4.4 ratio (ref 0.0–4.4)
Cholesterol, Total: 235 mg/dL — ABNORMAL HIGH (ref 100–199)
HDL: 53 mg/dL (ref >39)
LDL Chol Calc (NIH): 134 mg/dL — ABNORMAL HIGH (ref 0–99)
Triglycerides: 269 mg/dL — ABNORMAL HIGH (ref 0–149)
VLDL Cholesterol Cal: 48 mg/dL — ABNORMAL HIGH (ref 5–40)

## 2019-05-16 ENCOUNTER — Other Ambulatory Visit: Payer: Self-pay | Admitting: Family Medicine

## 2019-05-16 DIAGNOSIS — E119 Type 2 diabetes mellitus without complications: Secondary | ICD-10-CM

## 2019-05-16 DIAGNOSIS — H40013 Open angle with borderline findings, low risk, bilateral: Secondary | ICD-10-CM | POA: Diagnosis not present

## 2019-05-16 DIAGNOSIS — H26491 Other secondary cataract, right eye: Secondary | ICD-10-CM | POA: Diagnosis not present

## 2019-05-16 DIAGNOSIS — R0789 Other chest pain: Secondary | ICD-10-CM

## 2019-05-16 DIAGNOSIS — H35033 Hypertensive retinopathy, bilateral: Secondary | ICD-10-CM | POA: Diagnosis not present

## 2019-05-16 DIAGNOSIS — E782 Mixed hyperlipidemia: Secondary | ICD-10-CM

## 2019-05-16 LAB — HM DIABETES EYE EXAM

## 2019-05-16 MED ORDER — GLIPIZIDE 10 MG PO TABS: 10.0000 mg | ORAL_TABLET | Freq: Two times a day (BID) | ORAL | 1 refills | Status: DC

## 2019-05-16 MED ORDER — EZETIMIBE 10 MG PO TABS: 10.0000 mg | ORAL_TABLET | Freq: Every day | ORAL | 3 refills | Status: DC

## 2019-05-22 ENCOUNTER — Ambulatory Visit (INDEPENDENT_AMBULATORY_CARE_PROVIDER_SITE_OTHER): Payer: PPO | Admitting: Internal Medicine

## 2019-05-22 ENCOUNTER — Other Ambulatory Visit: Payer: Self-pay

## 2019-05-22 ENCOUNTER — Encounter: Payer: Self-pay | Admitting: Internal Medicine

## 2019-05-22 VITALS — BP 136/80 | HR 94 | Temp 97.2°F | Ht 64.0 in | Wt 151.4 lb

## 2019-05-22 DIAGNOSIS — R079 Chest pain, unspecified: Secondary | ICD-10-CM | POA: Diagnosis not present

## 2019-05-22 DIAGNOSIS — G8929 Other chronic pain: Secondary | ICD-10-CM | POA: Diagnosis not present

## 2019-05-22 DIAGNOSIS — R1013 Epigastric pain: Secondary | ICD-10-CM | POA: Diagnosis not present

## 2019-05-22 DIAGNOSIS — Z860100 Personal history of colon polyps, unspecified: Secondary | ICD-10-CM

## 2019-05-22 DIAGNOSIS — Z800 Family history of malignant neoplasm of digestive organs: Secondary | ICD-10-CM | POA: Diagnosis not present

## 2019-05-22 NOTE — Patient Instructions (Signed)
You have been scheduled for an endoscopy and colonoscopy. Please follow the written instructions given to you at your visit today. Please pick up your prep supplies at the pharmacy within the next 1-3 days. If you use inhalers (even only as needed), please bring them with you on the day of your procedure.   Normal BMI (Body Mass Index- based on height and weight) is between 23 and 30. Your BMI today is Body mass index is 25.99 kg/m. Marland Kitchen Please consider follow up  regarding your BMI with your Primary Care Provider.   I appreciate the opportunity to care for you. Silvano Rusk, MD, [facility]

## 2019-05-22 NOTE — Progress Notes (Signed)
Erin Benitez 69 y.o. 1950-10-05 [id number] Referred by: [name], MD  Assessment & Plan:   Encounter Diagnoses  Name Primary?  . Abdominal pain, chronic, epigastric Yes  . Chest pain, unspecified type   . Hx of colonic polyps   . Family history of colon cancer-multiple second/third dgeree relatives     Given her age and the persistence of symptoms though improved but persist on PPI will perform an EGD.  Given the history of colon polyps and multiple family members with a history of colon cancer and a mother with ovarian cancer also will perform a colonoscopy again.  The risks and benefits as well as alternatives of endoscopic procedure(s) have been discussed and reviewed. All questions answered. The patient agrees to proceed.  I appreciate the opportunity to care for this patient. CC: [name], MD   Subjective:   Chief Complaint: Abdominal pain  HPI Erin Benitez is here complaining of problems with epigastric and lower chest pain sharp pains almost thought she was having a heart attack, got back on generic Protonix with improvement though she still has an episode or 2.  It would go on through the day and intensify at night.  She modified her diet to where she was not eating so late before bedtime.  She is also been working on weight and diet changes related to diabetes and hypertension and thinks that has reduced the frequency.  There is no dysphagia.  She is not having bowel movement changes.  Dr. Pamella Pert recommended she return to be evaluated.  There is no radiation of the pain to the back or associated nausea or vomiting. Bowel habits are regular.  GI review of systems is otherwise negative.  Seen by me in 2016 with bilateral upper abdominal pain chest pain heartburn was started on generic Nexium which had helped her but she was having changes related to defecation and some defecation changes.  CT scanning labs were okay at that time.  2017 she had a repeat CT scan  abdomen and pelvis for upper abdominal pain with scattered colonic diverticulosis otherwise negative Colonoscopy 05/21/2014 with 4 polyps 2 not recovered the 2 that were were distal diminutive hyperplastic polyps, routine repeat colonoscopy originally planned for 5 years from then.  Also mild diverticulosis.  Family history notable for mother with ovarian cancer 2 aunts with colon cancer and 2 uncles with colon cancer and a paternal grandfather with colon cancer Allergies  Allergen Reactions  . Lisinopril Swelling  . Peanut-Containing Drug Products Anaphylaxis, Hives, Swelling and Other (See Comments)    Pistachio's - swelling of the throat     . Tramadol Nausea And Vomiting    Headache & dizziness with n&v   Current Meds  Medication Sig  . albuterol (VENTOLIN HFA) 108 (90 Base) MCG/ACT inhaler Inhale 2 puffs into the lungs every 6 (six) hours as needed for wheezing or shortness of breath.  Marland Kitchen amLODipine (NORVASC) 10 MG tablet Take 1 tablet (10 mg total) by mouth daily.  . blood glucose meter kit and supplies Per insurance preference. Check glucose once a day, alternate between fasting and 2 hours after dinner.  . Blood Pressure Monitor KIT 1 Units by Does not apply route daily.  Marland Kitchen ezetimibe (ZETIA) 10 MG tablet Take 1 tablet (10 mg total) by mouth daily.  Marland Kitchen glipiZIDE (GLUCOTROL) 10 MG tablet Take 1 tablet (10 mg total) by mouth 2 (two) times daily before a meal.  . methocarbamol (ROBAXIN) 500 MG tablet   .  methylPREDNISolone (MEDROL DOSEPAK) 4 MG TBPK tablet Take by mouth with food per packet instructions  . metoprolol succinate (TOPROL-XL) 25 MG 24 hr tablet Take 1 tablet (25 mg total) by mouth daily.  . pantoprazole (PROTONIX) 20 MG tablet Take 1 tablet (20 mg total) by mouth daily.  . pioglitazone (ACTOS) 30 MG tablet Take 1 tablet (30 mg total) by mouth daily.  . rosuvastatin (CRESTOR) 40 MG tablet Take 1 tablet (40 mg total) by mouth daily.  Marland Kitchen triamterene-hydrochlorothiazide  (MAXZIDE-25) 37.5-25 MG tablet Take 1 tablet by mouth daily.   Past Medical History:  Diagnosis Date  . Allergy   . Asthma   . Bronchitis   . Cataract   . Diverticulosis   . DM2 (diabetes mellitus, type 2) ([place])   . GERD (gastroesophageal reflux disease)   . Heart murmur   . Hyperplastic colon polyp   . Hypertension    Past Surgical History:  Procedure Laterality Date  . CESAREAN SECTION    . COLONOSCOPY    . TONSILLECTOMY     Social History   Social History Narrative   She reports she is single. She has 1 daughter. 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     2 caffeinated beverages daily.   No alcohol tobacco or drug use.   family history includes Cancer in her father, mother, sister, and sister; Colon cancer in her maternal aunt, maternal uncle, and paternal grandfather; Diabetes in her maternal aunt; Heart disease in her mother; Hyperlipidemia in her mother, sister, and sister; Liver disease in her mother; Stroke in her mother.   Review of Systems As per HPI.  Also some excessive urination and dyspnea at times.  Otherwise negative.  Objective:   Physical Exam BP 136/80   Pulse 94   Temp (!) 97.2 F (36.2 C)   Ht 5' 4"  (1.626 m)   Wt 151 lb 6.4 oz (68.7 kg)   SpO2 98%   BMI 25.99 kg/m  Well-developed well-nourished no acute distress black woman The eyes are anicteric Lungs are clear Heart sounds are normal The mid line chest wall is nontender The abdomen is soft nontender without organomegaly or mass and bowel sounds are present She has an appropriate mood and affect and is alert and oriented x3

## 2019-05-28 DIAGNOSIS — H1045 Other chronic allergic conjunctivitis: Secondary | ICD-10-CM | POA: Diagnosis not present

## 2019-06-04 NOTE — Telephone Encounter (Signed)
No action required.

## 2019-06-09 ENCOUNTER — Other Ambulatory Visit: Payer: Self-pay

## 2019-06-09 DIAGNOSIS — Z20822 Contact with and (suspected) exposure to covid-19: Secondary | ICD-10-CM

## 2019-06-11 ENCOUNTER — Encounter: Payer: Self-pay | Admitting: Internal Medicine

## 2019-06-21 ENCOUNTER — Encounter: Payer: PPO | Admitting: Internal Medicine

## 2019-06-21 ENCOUNTER — Other Ambulatory Visit: Payer: Self-pay | Admitting: Internal Medicine

## 2019-06-21 ENCOUNTER — Encounter: Payer: Self-pay | Admitting: Internal Medicine

## 2019-06-21 ENCOUNTER — Other Ambulatory Visit: Payer: Self-pay

## 2019-06-21 ENCOUNTER — Ambulatory Visit (AMBULATORY_SURGERY_CENTER): Payer: PPO | Admitting: Internal Medicine

## 2019-06-21 VITALS — BP 132/80 | HR 68 | Temp 98.2°F | Resp 16 | Ht 64.0 in | Wt 151.0 lb

## 2019-06-21 DIAGNOSIS — K3189 Other diseases of stomach and duodenum: Secondary | ICD-10-CM

## 2019-06-21 DIAGNOSIS — R0789 Other chest pain: Secondary | ICD-10-CM | POA: Diagnosis not present

## 2019-06-21 DIAGNOSIS — R1013 Epigastric pain: Secondary | ICD-10-CM | POA: Diagnosis not present

## 2019-06-21 DIAGNOSIS — Z86010 Personal history of colonic polyps: Secondary | ICD-10-CM | POA: Diagnosis not present

## 2019-06-21 MED ORDER — METRONIDAZOLE 500 MG PO TABS
500.0000 mg | ORAL_TABLET | Freq: Three times a day (TID) | ORAL | 0 refills | Status: DC
Start: 2019-06-21 — End: 2019-06-21

## 2019-06-21 MED ORDER — SODIUM CHLORIDE 0.9 % IV SOLN: 500.0000 mL | Freq: Once | INTRAVENOUS | Status: DC

## 2019-06-21 NOTE — Patient Instructions (Addendum)
There was some inflammation in the stomach - I took biopsies.  No colon polyps.  I will let you know about the biopsy results and treatment plans when results come in.  I appreciate the opportunity to care for you. Gatha Mayer, MD, [facility]   Handouts provided on diverticulosis and gastritis.   YOU HAD AN ENDOSCOPIC PROCEDURE TODAY AT [facility]:   Refer to the procedure report that was given to you for any specific questions about what was found during the examination.  If the procedure report does not answer your questions, please call your gastroenterologist to clarify.  If you requested that your care partner not be given the details of your procedure findings, then the procedure report has been included in a sealed envelope for you to review at your convenience later.  YOU SHOULD EXPECT: Some feelings of bloating in the abdomen. Passage of more gas than usual.  Walking can help get rid of the air that was put into your GI tract during the procedure and reduce the bloating. If you had a lower endoscopy (such as a colonoscopy or flexible sigmoidoscopy) you may notice spotting of blood in your stool or on the toilet paper. If you underwent a bowel prep for your procedure, you may not have a normal bowel movement for a few days.  Please Note:  You might notice some irritation and congestion in your nose or some drainage.  This is from the oxygen used during your procedure.  There is no need for concern and it should clear up in a day or so.  SYMPTOMS TO REPORT IMMEDIATELY:   Following lower endoscopy (colonoscopy or flexible sigmoidoscopy):  Excessive amounts of blood in the stool  Significant tenderness or worsening of abdominal pains  Swelling of the abdomen that is new, acute  Fever of 100F or higher   Following upper endoscopy (EGD)  Vomiting of blood or coffee ground material  New chest pain or pain under the shoulder blades  Painful or persistently difficult  swallowing  New shortness of breath  Fever of 100F or higher  Black, tarry-looking stools  For urgent or emergent issues, a gastroenterologist can be reached at any hour by calling [phone]. Do not use MyChart messaging for urgent concerns.    DIET:  We do recommend a small meal at first, but then you may proceed to your regular diet.  Drink plenty of fluids but you should avoid alcoholic beverages for 24 hours.  ACTIVITY:  You should plan to take it easy for the rest of today and you should NOT DRIVE or use heavy machinery until tomorrow (because of the sedation medicines used during the test).    FOLLOW UP: Our staff will call the number listed on your records 48-72 hours following your procedure to check on you and address any questions or concerns that you may have regarding the information given to you following your procedure. If we do not reach you, we will leave a message.  We will attempt to reach you two times.  During this call, we will ask if you have developed any symptoms of COVID 19. If you develop any symptoms (ie: fever, flu-like symptoms, shortness of breath, cough etc.) before then, please call [phone].  If you test positive for Covid 19 in the 2 weeks post procedure, please call and report this information to [place].    If any biopsies were taken you will be contacted by phone or by letter within  the next 1-3 weeks.  Please call us at [phone] if you have not heard about the biopsies in 3 weeks.    SIGNATURES/CONFIDENTIALITY: You and/or your care partner have signed paperwork which will be entered into your electronic medical record.  These signatures attest to the fact that that the information above on your After Visit Summary has been reviewed and is understood.  Full responsibility of the confidentiality of this discharge information lies with you and/or your care-partner.

## 2019-06-21 NOTE — Progress Notes (Signed)
A/ox3, pleased with MAC, report to RN 

## 2019-06-21 NOTE — Progress Notes (Signed)
Pt's states no medical or surgical changes since previsit or office visit. 

## 2019-06-21 NOTE — Progress Notes (Signed)
Called to room to assist during endoscopic procedure.  Patient ID and intended procedure confirmed with present staff. Received instructions for my participation in the procedure from the performing physician.  

## 2019-06-21 NOTE — Op Note (Signed)
[facility] Patient Name: Erin Benitez Procedure Date: 06/21/2019 4:07 PM MRN: [id number] Endoscopist: Gatha Mayer , MD Age: 69 Referring MD:  Date of Birth: Jul 05, 1950 Gender: Female Account #: [id number] Procedure:                Colonoscopy Indications:              High risk colon cancer surveillance: Personal                            history of colonic polyps Medicines:                Propofol per Anesthesia, Monitored Anesthesia Care Procedure:                Pre-Anesthesia Assessment:                           - Prior to the procedure, a History and Physical                            was performed, and patient medications and                            allergies were reviewed. The patient's tolerance of                            previous anesthesia was also reviewed. The risks                            and benefits of the procedure and the sedation                            options and risks were discussed with the patient.                            All questions were answered, and informed consent                            was obtained. Prior Anticoagulants: The patient has                            taken no previous anticoagulant or antiplatelet                            agents. ASA Grade Assessment: II - A patient with                            mild systemic disease. After reviewing the risks                            and benefits, the patient was deemed in                            satisfactory condition to undergo the procedure.  After obtaining informed consent, the colonoscope                            was passed under direct vision. Throughout the                            procedure, the patient's blood pressure, pulse, and                            oxygen saturations were monitored continuously. The                            Colonoscope was introduced through the anus and                            advanced to the  the cecum, identified by                            appendiceal orifice and ileocecal valve. The                            colonoscopy was performed without difficulty. The                            patient tolerated the procedure well. The quality                            of the bowel preparation was good. The ileocecal                            valve, appendiceal orifice, and rectum were                            photographed. Scope In: 4:25:46 PM Scope Out: 4:38:34 PM Scope Withdrawal Time: 0 hours 10 minutes 48 seconds  Total Procedure Duration: 0 hours 12 minutes 48 seconds  Findings:                 The perianal and digital rectal examinations were                            normal.                           Multiple diverticula were found in the entire colon.                           The exam was otherwise without abnormality on                            direct and retroflexion views. Complications:            No immediate complications. Estimated Blood Loss:     Estimated blood loss: none. Impression:               - Diverticulosis in the entire examined colon.                           -  The examination was otherwise normal on direct                            and retroflexion views.                           - No specimens collected. Recommendation:           - Patient has a contact number available for                            emergencies. The signs and symptoms of potential                            delayed complications were discussed with the                            patient. Return to normal activities tomorrow.                            Written discharge instructions were provided to the                            patient.                           - Resume previous diet.                           - Continue present medications.                           - Repeat colonoscopy in 5 years. Had 2 unretrieved                            dimnutive polysp 2016,  and also has multiple second                            dgeree relatives w/ [facility] Gatha Mayer, MD 06/21/2019 4:57:06 PM This report has been signed electronically.

## 2019-06-21 NOTE — Op Note (Signed)
[facility] Patient Name: Katlynn Penuelas Procedure Date: 06/21/2019 4:07 PM MRN: [id number] Endoscopist: Gatha Mayer , MD Age: 69 Referring MD:  Date of Birth: December 11, 1950 Gender: Female Account #: [id number] Procedure:                Upper GI endoscopy Indications:              Epigastric abdominal pain Medicines:                Propofol per Anesthesia, Monitored Anesthesia Care Procedure:                Pre-Anesthesia Assessment:                           - Prior to the procedure, a History and Physical                            was performed, and patient medications and                            allergies were reviewed. The patient's tolerance of                            previous anesthesia was also reviewed. The risks                            and benefits of the procedure and the sedation                            options and risks were discussed with the patient.                            All questions were answered, and informed consent                            was obtained. Prior Anticoagulants: The patient has                            taken no previous anticoagulant or antiplatelet                            agents. ASA Grade Assessment: II - A patient with                            mild systemic disease. After reviewing the risks                            and benefits, the patient was deemed in                            satisfactory condition to undergo the procedure.                           After obtaining informed consent, the endoscope was  passed under direct vision. Throughout the                            procedure, the patient's blood pressure, pulse, and                            oxygen saturations were monitored continuously. The                            Endoscope was introduced through the mouth, and                            advanced to the second part of duodenum. The upper                            GI  endoscopy was accomplished without difficulty.                            The patient tolerated the procedure well. Scope In: Scope Out: Findings:                 Multiple erosions with no bleeding and no stigmata                            of recent bleeding were found in the prepyloric                            region of the stomach. Biopsies were taken with a                            cold forceps for histology. Verification of patient                            identification for the specimen was done. Estimated                            blood loss was minimal.                           The exam was otherwise without abnormality.                           The cardia and gastric fundus were normal on                            retroflexion. Complications:            No immediate complications. Estimated Blood Loss:     Estimated blood loss was minimal. Impression:               - Erosive gastropathy with no bleeding and no                            stigmata of recent bleeding. Biopsied.                           -  The examination was otherwise normal. Recommendation:           - Patient has a contact number available for                            emergencies. The signs and symptoms of potential                            delayed complications were discussed with the                            patient. Return to normal activities tomorrow.                            Written discharge instructions were provided to the                            patient.                           - Resume previous diet.                           - Continue present medications.                           - Await pathology results.                           - See the other procedure note for documentation of                            additional recommendations. Gatha Mayer, MD 06/21/2019 4:53:49 PM This report has been signed electronically.

## 2019-06-25 ENCOUNTER — Telehealth: Payer: Self-pay

## 2019-06-25 ENCOUNTER — Telehealth: Payer: Self-pay | Admitting: *Deleted

## 2019-06-25 NOTE — Telephone Encounter (Signed)
  Follow up Call-  Call back number 06/21/2019  Post procedure Call Back phone  # [phone]  Permission to leave phone message Yes  Some recent data might be hidden     Left message

## 2019-06-25 NOTE — Telephone Encounter (Signed)
Left message on f/u call 

## 2019-06-27 ENCOUNTER — Other Ambulatory Visit: Payer: Self-pay | Admitting: Internal Medicine

## 2019-06-27 MED ORDER — ESOMEPRAZOLE MAGNESIUM 40 MG PO PACK: 40.0000 mg | PACK | Freq: Every day | ORAL | 1 refills | Status: DC

## 2019-07-02 ENCOUNTER — Encounter: Payer: Self-pay | Admitting: Family Medicine

## 2019-07-03 ENCOUNTER — Other Ambulatory Visit: Payer: Self-pay | Admitting: Family Medicine

## 2019-07-03 MED ORDER — MELOXICAM 15 MG PO TABS
15.0000 mg | ORAL_TABLET | Freq: Every day | ORAL | 0 refills | Status: DC
Start: 2019-07-03 — End: 2019-07-30

## 2019-07-03 MED ORDER — METHOCARBAMOL 500 MG PO TABS: 500.0000 mg | ORAL_TABLET | Freq: Three times a day (TID) | ORAL | 1 refills | Status: DC | PRN

## 2019-07-16 ENCOUNTER — Telehealth: Payer: Self-pay | Admitting: *Deleted

## 2019-07-16 NOTE — Telephone Encounter (Signed)
Schedule AWV.  

## 2019-07-16 NOTE — Telephone Encounter (Signed)
Patient called back to schedule

## 2019-07-23 ENCOUNTER — Ambulatory Visit (INDEPENDENT_AMBULATORY_CARE_PROVIDER_SITE_OTHER): Payer: PPO | Admitting: Emergency Medicine

## 2019-07-23 VITALS — BP 117/67 | Ht 64.0 in | Wt 151.0 lb

## 2019-07-23 DIAGNOSIS — Z0000 Encounter for general adult medical examination without abnormal findings: Secondary | ICD-10-CM

## 2019-07-23 NOTE — Patient Instructions (Addendum)
Thank you for taking time to come for your Medicare Wellness Visit. I appreciate your ongoing commitment to your health goals. Please review the following plan we discussed and let me know if I can assist you in the future.  Ukiah Trawick LPN  reventive Care 65 Years and Older, Female Preventive care refers to lifestyle choices and visits with your health care provider that can promote health and wellness. This includes:  A yearly physical exam. This is also called an annual well check.  Regular dental and eye exams.  Immunizations.  Screening for certain conditions.  Healthy lifestyle choices, such as diet and exercise. What can I expect for my preventive care visit? Physical exam Your health care provider will check:  Height and weight. These may be used to calculate body mass index (BMI), which is a measurement that tells if you are at a healthy weight.  Heart rate and blood pressure.  Your skin for abnormal spots. Counseling Your health care provider may ask you questions about:  Alcohol, tobacco, and drug use.  Emotional well-being.  Home and relationship well-being.  Sexual activity.  Eating habits.  History of falls.  Memory and ability to understand (cognition).  Work and work environment.  Pregnancy and menstrual history. What immunizations do I need?  Influenza (flu) vaccine  This is recommended every year. Tetanus, diphtheria, and pertussis (Tdap) vaccine  You may need a Td booster every 10 years. Varicella (chickenpox) vaccine  You may need this vaccine if you have not already been vaccinated. Zoster (shingles) vaccine  You may need this after age 60. Pneumococcal conjugate (PCV13) vaccine  One dose is recommended after age 69. Pneumococcal polysaccharide (PPSV23) vaccine  One dose is recommended after age 69. Measles, mumps, and rubella (MMR) vaccine  You may need at least one dose of MMR if you were born in 1957 or later. You may also  need a second dose. Meningococcal conjugate (MenACWY) vaccine  You may need this if you have certain conditions. Hepatitis A vaccine  You may need this if you have certain conditions or if you travel or work in places where you may be exposed to hepatitis A. Hepatitis B vaccine  You may need this if you have certain conditions or if you travel or work in places where you may be exposed to hepatitis B. Haemophilus influenzae type b (Hib) vaccine  You may need this if you have certain conditions. You may receive vaccines as individual doses or as more than one vaccine together in one shot (combination vaccines). Talk with your health care provider about the risks and benefits of combination vaccines. What tests do I need? Blood tests  Lipid and cholesterol levels. These may be checked every 5 years, or more frequently depending on your overall health.  Hepatitis C test.  Hepatitis B test. Screening  Lung cancer screening. You may have this screening every year starting at age 55 if you have a 30-pack-year history of smoking and currently smoke or have quit within the past 15 years.  Colorectal cancer screening. All adults should have this screening starting at age 50 and continuing until age 75. Your health care provider may recommend screening at age 45 if you are at increased risk. You will have tests every 1-10 years, depending on your results and the type of screening test.  Diabetes screening. This is done by checking your blood sugar (glucose) after you have not eaten for a while (fasting). You may have this done every 1-3   years.  Mammogram. This may be done every 1-2 years. Talk with your health care provider about how often you should have regular mammograms.  BRCA-related cancer screening. This may be done if you have a family history of breast, ovarian, tubal, or peritoneal cancers. Other tests  Sexually transmitted disease (STD) testing.  Bone density scan. This is done  to screen for osteoporosis. You may have this done starting at age 69. Follow these instructions at home: Eating and drinking  Eat a diet that includes fresh fruits and vegetables, whole grains, lean protein, and low-fat dairy products. Limit your intake of foods with high amounts of sugar, saturated fats, and salt.  Take vitamin and mineral supplements as recommended by your health care provider.  Do not drink alcohol if your health care provider tells you not to drink.  If you drink alcohol: ? Limit how much you have to 0-1 drink a day. ? Be aware of how much alcohol is in your drink. In the [place] one drink equals one 12 oz bottle of beer (355 mL), one 5 oz glass of wine (148 mL), or one 1 oz glass of hard liquor (44 mL). Lifestyle  Take daily care of your teeth and gums.  Stay active. Exercise for at least 30 minutes on 5 or more days each week.  Do not use any products that contain nicotine or tobacco, such as cigarettes, e-cigarettes, and chewing tobacco. If you need help quitting, ask your health care provider.  If you are sexually active, practice safe sex. Use a condom or other form of protection in order to prevent STIs (sexually transmitted infections).  Talk with your health care provider about taking a low-dose aspirin or statin. What's next?  Go to your health care provider once a year for a well check visit.  Ask your health care provider how often you should have your eyes and teeth checked.  Stay up to date on all vaccines. This information is not intended to replace advice given to you by your health care provider. Make sure you discuss any questions you have with your health care provider. Document Revised: 01/11/2018 Document Reviewed: 01/11/2018 Elsevier Patient Education  2020 [employer].

## 2019-07-23 NOTE — Progress Notes (Signed)
Presents today for [employer] Visit   Date of last exam: 05-16-2019  Interpreter used for this visit?   No  I connected with  Erin Benitez on 07/23/19 by a telephone  and verified that I am speaking with the correct person using two identifiers.   I discussed the limitations of evaluation and management by telemedicine. The patient expressed understanding and agreed to proceed.    Patient Care Team: [name], MD as PCP - General (Family Medicine)   Other items to address today:   Discussed Eye/Dental Discussed Immunizations    Other Screening: Last screening for diabetes: 05/16/2019 Last lipid screening: 05/16/2019  ADVANCE DIRECTIVES: Discussed: yes On File: no Materials Provided: yes  Immunization status:  Immunization History  Administered Date(s) Administered  . Fluad Quad(high Dose 65+) 10/23/2018  . Influenza,inj,Quad PF,6+ Mos 03/09/2015  . PFIZER SARS-COV-2 Vaccination 04/20/2019, 05/04/2019  . PPD Test 04/01/2013, 09/30/2017  . Pneumococcal Conjugate-13 07/08/2017  . Pneumococcal Polysaccharide-23 08/21/2018  . Td 07/08/2017     Health Maintenance Due  Topic Date Due  . MAMMOGRAM  Never done  . FOOT EXAM  07/18/2019  . URINE MICROALBUMIN  07/18/2019     Functional Status Survey: Is the patient deaf or have difficulty hearing?: No Does the patient have difficulty seeing, even when wearing glasses/contacts?: No Does the patient have difficulty concentrating, remembering, or making decisions?: No Does the patient have difficulty walking or climbing stairs?: No Does the patient have difficulty dressing or bathing?: No Does the patient have difficulty doing errands alone such as visiting a doctor's office or shopping?: No   6CIT Screen 07/23/2019 07/18/2018 09/30/2017  What Year? 0 points 0 points 0 points  What month? 0 points 0 points 0 points  What time? 0 points 0 points 0 points  Count back from 20 0 points 0 points  0 points  Months in reverse 0 points 0 points 0 points  Repeat phrase 0 points 0 points 0 points  Total Score 0 0 0        Clinical Support from 07/23/2019 in [facility]  AUDIT-C Score 0       Home Environment:   No trouble climbing stairs Lives in one story home No scattered rugs No grab bars Adequate lighting/ no clutter Works full-time at a daycare center    Patient Active Problem List   Diagnosis Date Noted  . Type 2 diabetes mellitus without complication, without long-term current use of insulin ([place]) 09/06/2017  . Diverticulosis of colon without hemorrhage 03/09/2015  . Atypical chest pain 03/09/2015  . Hyperlipidemia 03/09/2015  . Gastroesophageal reflux disease without esophagitis 03/09/2015  . HTN (hypertension) 04/01/2013  . Asthma 04/01/2013     Past Medical History:  Diagnosis Date  . Allergy   . Asthma   . Bronchitis   . Cataract   . Diverticulosis   . DM2 (diabetes mellitus, type 2) ([place])   . GERD (gastroesophageal reflux disease)   . Heart murmur   . Hyperplastic colon polyp   . Hypertension      Past Surgical History:  Procedure Laterality Date  . CESAREAN SECTION    . COLONOSCOPY    . ESOPHAGOGASTRODUODENOSCOPY    . TONSILLECTOMY       Family History  Problem Relation Age of Onset  . Cancer Mother        ovarian cancer  . Heart disease Mother   . Liver disease Mother   .  Hyperlipidemia Mother   . Stroke Mother   . Cancer Father        lung cancer  . Cancer Sister   . Hyperlipidemia Sister   . Cancer Sister   . Hyperlipidemia Sister   . Diabetes Maternal Aunt   . Colon cancer Maternal Aunt        x2 aunts  . Colon cancer Maternal Uncle        2 uncles  . Colon cancer Paternal Grandfather   . Esophageal cancer Neg Hx   . Rectal cancer Neg Hx   . Stomach cancer Neg Hx      Social History   Socioeconomic History  . Marital status: Single    Spouse name: Not on file  . Number of children: 1  . Years of  education: Not on file  . Highest education level: Not on file  Occupational History  . Occupation: Educator  Tobacco Use  . Smoking status: Never Smoker  . Smokeless tobacco: Never Used  Vaping Use  . Vaping Use: Never used  Substance and Sexual Activity  . Alcohol use: No  . Drug use: No  . Sexual activity: Yes  Other Topics Concern  . Not on file  Social History Narrative   She reports she is single. She has 1 daughter. 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     2 caffeinated beverages daily.   No alcohol tobacco or drug use.   Social Determinants of Health   Financial Resource Strain:   . Difficulty of Paying Living Expenses:   Food Insecurity:   . Worried About Charity fundraiser in the Last Year:   . Arboriculturist in the Last Year:   Transportation Needs:   . Film/video editor (Medical):   Marland Kitchen Lack of Transportation (Non-Medical):   Physical Activity:   . Days of Exercise per Week:   . Minutes of Exercise per Session:   Stress:   . Feeling of Stress :   Social Connections:   . Frequency of Communication with Friends and Family:   . Frequency of Social Gatherings with Friends and Family:   . Attends Religious Services:   . Active Member of Clubs or Organizations:   . Attends Archivist Meetings:   Marland Kitchen Marital Status:   Intimate Partner Violence:   . Fear of Current or Ex-Partner:   . Emotionally Abused:   Marland Kitchen Physically Abused:   . Sexually Abused:      Allergies  Allergen Reactions  . Lisinopril Swelling  . Peanut-Containing Drug Products Anaphylaxis, Hives, Swelling and Other (See Comments)    Pistachio's - swelling of the throat     . Tramadol Nausea And Vomiting    Headache & dizziness with n&v     Prior to Admission medications   Medication Sig Start Date Benitez Date Taking?  Authorizing Provider  albuterol (VENTOLIN HFA) 108 (90 Base) MCG/ACT inhaler Inhale 2 puffs into the lungs every 6 (six) hours as needed for wheezing or shortness of breath. 05/28/18  Yes Hawks, Christy A, FNP  amLODipine (NORVASC) 10 MG tablet Take 1 tablet (10 mg total) by mouth daily. 01/28/19  Yes [name], MD  blood glucose meter kit and supplies Per insurance preference. Check glucose once a day, alternate between fasting and 2  hours after dinner. 07/18/18  Yes [name], MD  Blood Pressure Monitor KIT 1 Units by Does not apply route daily. 06/03/17  Yes Tenna Delaine D, PA-C  esomeprazole (NEXIUM) 40 MG packet Take 40 mg by mouth daily before breakfast. 06/27/19  Yes Gatha Mayer, MD  ezetimibe (ZETIA) 10 MG tablet Take 1 tablet (10 mg total) by mouth daily. 05/16/19  Yes [name], MD  glipiZIDE (GLUCOTROL) 10 MG tablet Take 1 tablet (10 mg total) by mouth 2 (two) times daily before a meal. 05/16/19  Yes [name], MD  meloxicam (MOBIC) 15 MG tablet Take 1 tablet (15 mg total) by mouth daily. Do not take any other NSAID 07/03/19  Yes [name], MD  methocarbamol (ROBAXIN) 500 MG tablet Take 1 tablet (500 mg total) by mouth every 8 (eight) hours as needed for muscle spasms. 07/03/19  Yes [name], MD  metoprolol succinate (TOPROL-XL) 25 MG 24 hr tablet Take 1 tablet (25 mg total) by mouth daily. 10/23/18  Yes [name], MD  pioglitazone (ACTOS) 30 MG tablet Take 1 tablet (30 mg total) by mouth daily. 10/23/18  Yes [name], MD  rosuvastatin (CRESTOR) 40 MG tablet Take 1 tablet (40 mg total) by mouth daily. 07/18/18  Yes [name], MD  triamterene-hydrochlorothiazide (MAXZIDE-25) 37.5-25 MG tablet Take 1 tablet by mouth daily. 04/05/19  Yes Posey Boyer, MD     Depression screen [facility] 2/9 07/23/2019 04/29/2019 04/19/2019 04/11/2019 04/05/2019  Decreased Interest 0 0 0 0 0  Down, Depressed, Hopeless 0 0 0 0 0  PHQ - 2 Score 0 0 0 0 0      Fall Risk  07/23/2019 04/29/2019 04/19/2019 04/11/2019 04/05/2019  Falls in the past year? 0 0 0 0 0  Number falls in past yr: 0 0 - 0 0  Injury with Fall? 0 0 - 0 0  Follow up Falls evaluation completed;Education provided Falls evaluation completed Falls evaluation completed - Falls evaluation completed      PHYSICAL EXAM: BP 117/67 Comment: TAKEN FROM A PREVIOUS VISIT  Ht 5' 4"  (1.626 m)   Wt 151 lb (68.5 kg)   BMI 25.92 kg/m    Wt Readings from Last 3 Encounters:  07/23/19 151 lb (68.5 kg)  06/21/19 151 lb (68.5 kg)  05/22/19 151 lb 6.4 oz (68.7 kg)       Education/Counseling provided regarding diet and exercise, prevention of chronic diseases, smoking/tobacco cessation, if applicable, and reviewed "Covered Medicare Preventive Services."

## 2019-07-24 ENCOUNTER — Ambulatory Visit (INDEPENDENT_AMBULATORY_CARE_PROVIDER_SITE_OTHER): Payer: PPO | Admitting: Orthopedic Surgery

## 2019-07-24 ENCOUNTER — Encounter: Payer: Self-pay | Admitting: Orthopedic Surgery

## 2019-07-24 DIAGNOSIS — M5412 Radiculopathy, cervical region: Secondary | ICD-10-CM

## 2019-07-24 MED ORDER — CELECOXIB 100 MG PO CAPS
ORAL_CAPSULE | ORAL | 0 refills | Status: DC
Start: 2019-07-24 — End: 2019-10-31

## 2019-07-24 NOTE — Progress Notes (Signed)
Office Visit Note   Patient: Erin Benitez           Date of Birth: [birth year]           MRN: [id number] Visit Date: 07/24/2019 Requested by: [name], MD [address] [place],  [place] [address] PCP: [name], MD  Subjective: Chief Complaint  Patient presents with  . bilateral arm pain    HPI: Erin Benitez is a 69 year old patient with right arm and neck pain and new left arm pain.  We saw her 3 months ago and diagnosis at that time was [facility] joint arthritis versus right shoulder structural problem versus cervical radiculopathy.  She did well with a Medrol Dosepak but symptoms recurred.  For the last month she reports new pain in the right and left arm.  The left arm has subjective weakness.  BC has helped.  Has a history of reflux.  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              ROS: All systems reviewed are negative as they relate to the chief complaint within the history of present illness.  Patient denies  fevers or chills.   Assessment & Plan: Visit Diagnoses:  1. Radiculopathy, cervical region     Plan: Impression is neck pain and stiffness with some limitation of extension along with new left-sided problems.  This looks like cervical radiculopathy affecting both arms.  No definite objective weakness today but she does report some subjective weakness in that left arm.  Left arm symptoms have been going on for a month.  She does have history of reflux.  We will try Celebrex and also MRI cervical spine to evaluate for bilateral radiculopathy.  May be foraminal stenosis or cervical spine stenosis.  Follow-up after that study.  Follow-Up Instructions: Return for after MRI.   Orders:  No orders of the defined types were placed in this encounter.  No orders of the defined types were placed in this encounter.     Procedures: No procedures performed   Clinical Data: No additional findings.  Objective: Vital Signs: There were no vitals taken for this  visit.  Physical Exam:   Constitutional: Patient appears well-developed HEENT:  Head: Normocephalic Eyes:EOM are normal Neck: Normal range of motion Cardiovascular: Normal rate Pulmonary/chest: Effort normal Neurologic: Patient is alert Skin: Skin is warm Psychiatric: Patient has normal mood and affect    Ortho Exam: Ortho exam demonstrates extension of about 20 degrees only but flexion chin to chest.  Rotation is 60 degrees bilaterally.  Patient has 5 out of 5 grip EPL FPL interosseous wrist flexion extension bicep triceps and deltoid strength with palpable radial pulses and intact sensation in the C5-T1.  No restriction of passive range of motion of bilateral shoulders.  Rotator cuff strength is intact.  No other masses lymphadenopathy or skin changes noted in that neck or shoulder region.  Specialty Comments:  No specialty comments available.  Imaging: No results found.   PMFS History: Patient Active Problem List   Diagnosis Date Noted  . Type 2 diabetes mellitus without complication, without long-term current use of insulin ([place]) 09/06/2017  . Diverticulosis of colon without hemorrhage 03/09/2015  . Atypical chest pain 03/09/2015  . Hyperlipidemia 03/09/2015  . Gastroesophageal reflux disease without esophagitis 03/09/2015  . HTN (hypertension) 04/01/2013  . Asthma 04/01/2013   Past Medical History:  Diagnosis Date  . Allergy   . Asthma   . Bronchitis   .  Cataract   . Diverticulosis   . DM2 (diabetes mellitus, type 2) ([place])   . GERD (gastroesophageal reflux disease)   . Heart murmur   . Hyperplastic colon polyp   . Hypertension     Family History  Problem Relation Age of Onset  . Cancer Mother        ovarian cancer  . Heart disease Mother   . Liver disease Mother   . Hyperlipidemia Mother   . Stroke Mother   . Cancer Father        lung cancer  . Cancer Sister   . Hyperlipidemia Sister   . Cancer Sister   . Hyperlipidemia Sister   . Diabetes Maternal  Aunt   . Colon cancer Maternal Aunt        x2 aunts  . Colon cancer Maternal Uncle        2 uncles  . Colon cancer Paternal Grandfather   . Esophageal cancer Neg Hx   . Rectal cancer Neg Hx   . Stomach cancer Neg Hx     Past Surgical History:  Procedure Laterality Date  . CESAREAN SECTION    . COLONOSCOPY    . ESOPHAGOGASTRODUODENOSCOPY    . TONSILLECTOMY     Social History   Occupational History  . Occupation: Educator  Tobacco Use  . Smoking status: Never Smoker  . Smokeless tobacco: Never Used  Vaping Use  . Vaping Use: Never used  Substance and Sexual Activity  . Alcohol use: No  . Drug use: No  . Sexual activity: Yes

## 2019-07-24 NOTE — Addendum Note (Signed)
Addended byLaurann Montana on: 07/24/2019 10:07 AM   Modules accepted: Orders

## 2019-07-30 ENCOUNTER — Encounter: Payer: Self-pay | Admitting: Family Medicine

## 2019-07-30 ENCOUNTER — Ambulatory Visit (INDEPENDENT_AMBULATORY_CARE_PROVIDER_SITE_OTHER): Payer: PPO | Admitting: Family Medicine

## 2019-07-30 ENCOUNTER — Other Ambulatory Visit: Payer: Self-pay

## 2019-07-30 VITALS — BP 140/86 | HR 71 | Temp 98.7°F | Ht 64.0 in | Wt 153.0 lb

## 2019-07-30 DIAGNOSIS — E119 Type 2 diabetes mellitus without complications: Secondary | ICD-10-CM | POA: Diagnosis not present

## 2019-07-30 DIAGNOSIS — I10 Essential (primary) hypertension: Secondary | ICD-10-CM

## 2019-07-30 DIAGNOSIS — Z1231 Encounter for screening mammogram for malignant neoplasm of breast: Secondary | ICD-10-CM

## 2019-07-30 DIAGNOSIS — E782 Mixed hyperlipidemia: Secondary | ICD-10-CM

## 2019-07-30 MED ORDER — METOPROLOL SUCCINATE ER 25 MG PO TB24: 25.0000 mg | ORAL_TABLET | Freq: Every day | ORAL | 0 refills | Status: DC

## 2019-07-30 MED ORDER — PIOGLITAZONE HCL 30 MG PO TABS: 30.0000 mg | ORAL_TABLET | Freq: Every day | ORAL | 1 refills | Status: DC

## 2019-07-30 MED ORDER — AMLODIPINE BESYLATE 10 MG PO TABS: 10.0000 mg | ORAL_TABLET | Freq: Every day | ORAL | 1 refills | Status: DC

## 2019-07-30 MED ORDER — ROSUVASTATIN CALCIUM 40 MG PO TABS: 40.0000 mg | ORAL_TABLET | Freq: Every day | ORAL | 3 refills | Status: DC

## 2019-07-30 NOTE — Patient Instructions (Signed)
° ° ° °  If you have lab work done today you will be contacted with your lab results within the next 2 weeks.  If you have not heard from us then please contact us. The fastest way to get your results is to register for My Chart. ° ° °IF you received an x-ray today, you will receive an invoice from [facility]. Please contact  Radiology at [phone] with questions or concerns regarding your invoice.  ° °IF you received labwork today, you will receive an invoice from [facility]. Please contact [facility] at [phone] with questions or concerns regarding your invoice.  ° °Our billing staff will not be able to assist you with questions regarding bills from these companies. ° °You will be contacted with the lab results as soon as they are available. The fastest way to get your results is to activate your My Chart account. Instructions are located on the last page of this paperwork. If you have not heard from us regarding the results in 2 weeks, please contact this office. °  ° ° ° °

## 2019-07-30 NOTE — Progress Notes (Signed)
6/29/20218:18 AM  Erin Benitez 06/27/1950, 69 y.o., female [id number]  Chief Complaint  Patient presents with  . Diabetes  . Hypertension    HPI:   Patient is a 69 y.o. female with past medical history significant for HTN, DM2, GERD, HLP, diverticulosiswho presents today for routiine followup  Last OV march 2021 - increased glipizide to 68m BID and added zetia  Overall she is doing well She is tolerating changes to meds wo issues Rarely checks cbgs She denies any symptoms of hypoglycemia Continues to work on cutting back on sodas, drinking more water Continues to see ortho, MRI of shoulder pending, rx celebrex Denies any numbness or tingling of feet Has 3 month fu with optho in aug She has no acute concerns  Lab Results  Component Value Date   HGBA1C 7.7 (H) 04/29/2019   HGBA1C 7.6 (A) 01/28/2019   HGBA1C 9.0 (A) 10/23/2018   Lab Results  Component Value Date   LDLCALC 134 (H) 04/29/2019   CREATININE 0.85 04/29/2019    Depression screen PHQ 2/9 07/30/2019 07/23/2019 04/29/2019  Decreased Interest 0 0 0  Down, Depressed, Hopeless 0 0 0  PHQ - 2 Score 0 0 0    Fall Risk  07/30/2019 07/23/2019 04/29/2019 04/19/2019 04/11/2019  Falls in the past year? 0 0 0 0 0  Number falls in past yr: 0 0 0 - 0  Injury with Fall? 0 0 0 - 0  Follow up - Falls evaluation completed;Education provided Falls evaluation completed Falls evaluation completed -     Allergies  Allergen Reactions  . Lisinopril Swelling  . Peanut-Containing Drug Products Anaphylaxis, Hives, Swelling and Other (See Comments)    Pistachio's - swelling of the throat     . Tramadol Nausea And Vomiting    Headache & dizziness with n&v    Prior to Admission medications   Medication Sig Start Date Benitez Date Taking? Authorizing Provider  albuterol (VENTOLIN HFA) 108 (90 Base) MCG/ACT inhaler Inhale 2 puffs into the lungs every 6 (six) hours as needed for wheezing or shortness of breath. 05/28/18  Yes Hawks,  Christy A, FNP  amLODipine (NORVASC) 10 MG tablet Take 1 tablet (10 mg total) by mouth daily. 01/28/19  Yes SRutherford Guys MD  blood glucose meter kit and supplies Per insurance preference. Check glucose once a day, alternate between fasting and 2 hours after dinner. 07/18/18  Yes SRutherford Guys MD  Blood Pressure Monitor KIT 1 Units by Does not apply route daily. 06/03/17  Yes WTimmothy Euler BTanzaniaD, PA-C  celecoxib (CELEBREX) 100 MG capsule 1 po bid prn 07/24/19  Yes Dean, GTonna Corner MD  esomeprazole (NEXIUM) 40 MG packet Take 40 mg by mouth daily before breakfast. 06/27/19  Yes GGatha Mayer MD  ezetimibe (ZETIA) 10 MG tablet Take 1 tablet (10 mg total) by mouth daily. 05/16/19  Yes SRutherford Guys MD  glipiZIDE (GLUCOTROL) 10 MG tablet Take 1 tablet (10 mg total) by mouth 2 (two) times daily before a meal. 05/16/19  Yes SRutherford Guys MD  meloxicam (MOBIC) 15 MG tablet Take 1 tablet (15 mg total) by mouth daily. Do not take any other NSAID 07/03/19  Yes SRutherford Guys MD  methocarbamol (ROBAXIN) 500 MG tablet Take 1 tablet (500 mg total) by mouth every 8 (eight) hours as needed for muscle spasms. 07/03/19  Yes SRutherford Guys MD  metoprolol succinate (TOPROL-XL) 25 MG 24 hr tablet Take 1 tablet (25 mg total) by mouth  daily. 10/23/18  Yes [name], MD  pioglitazone (ACTOS) 30 MG tablet Take 1 tablet (30 mg total) by mouth daily. 10/23/18  Yes [name], MD  rosuvastatin (CRESTOR) 40 MG tablet Take 1 tablet (40 mg total) by mouth daily. 07/18/18  Yes [name], MD  triamterene-hydrochlorothiazide (MAXZIDE-25) 37.5-25 MG tablet Take 1 tablet by mouth daily. 04/05/19  Yes Posey Boyer, MD    Past Medical History:  Diagnosis Date  . Allergy   . Asthma   . Bronchitis   . Cataract   . Diverticulosis   . DM2 (diabetes mellitus, type 2) ([place])   . GERD (gastroesophageal reflux disease)   . Heart murmur   . Hyperplastic colon polyp   . Hypertension     Past  Surgical History:  Procedure Laterality Date  . CESAREAN SECTION    . COLONOSCOPY    . ESOPHAGOGASTRODUODENOSCOPY    . TONSILLECTOMY      Social History   Tobacco Use  . Smoking status: Never Smoker  . Smokeless tobacco: Never Used  Substance Use Topics  . Alcohol use: No    Family History  Problem Relation Age of Onset  . Cancer Mother        ovarian cancer  . Heart disease Mother   . Liver disease Mother   . Hyperlipidemia Mother   . Stroke Mother   . Cancer Father        lung cancer  . Cancer Sister   . Hyperlipidemia Sister   . Cancer Sister   . Hyperlipidemia Sister   . Diabetes Maternal Aunt   . Colon cancer Maternal Aunt        x2 aunts  . Colon cancer Maternal Uncle        2 uncles  . Colon cancer Paternal Grandfather   . Esophageal cancer Neg Hx   . Rectal cancer Neg Hx   . Stomach cancer Neg Hx     Review of Systems  Constitutional: Negative for chills and fever.  Respiratory: Negative for cough and shortness of breath.   Cardiovascular: Negative for chest pain, palpitations and leg swelling.  Gastrointestinal: Negative for abdominal pain, nausea and vomiting.  per hpi   OBJECTIVE:  Today's Vitals   07/30/19 0811  BP: 140/86  Pulse: 71  Temp: 98.7 F (37.1 C)  SpO2: 96%  Weight: 153 lb (69.4 kg)  Height: _0  (1.626 m)   Body mass index is 26.26 kg/m.   Wt Readings from Last 3 Encounters:  07/30/19 153 lb (69.4 kg)  07/23/19 151 lb (68.5 kg)  06/21/19 151 lb (68.5 kg)     Physical Exam Vitals and nursing note reviewed.  Constitutional:      Appearance: She is well-developed.  HENT:     Head: Normocephalic and atraumatic.     Mouth/Throat:     Pharynx: No oropharyngeal exudate.  Eyes:     General: No scleral icterus.    Conjunctiva/sclera: Conjunctivae normal.     Pupils: Pupils are equal, round, and reactive to light.  Cardiovascular:     Rate and Rhythm: Normal rate and regular rhythm.     Heart sounds: Normal heart  sounds. No murmur heard.  No friction rub. No gallop.   Pulmonary:     Effort: Pulmonary effort is normal.     Breath sounds: Normal breath sounds. No wheezing or rales.  Musculoskeletal:     Cervical back: Neck supple.  Skin:    General:  Skin is warm and dry.  Neurological:     Mental Status: She is alert and oriented to person, place, and time.     No results found for this or any previous visit (from the past 24 hour(s)).  No results found.   ASSESSMENT and PLAN  1. Type 2 diabetes mellitus without complication, without long-term current use of insulin ([place]) Checking labs today, medications will be adjusted as needed. Mindful of weight gain and glipizide.  - Microalbumin / creatinine urine ratio - Hemoglobin A1c  2. Mixed hyperlipidemia Checking labs today, on max oral therapy. - Comprehensive metabolic panel - Lipid panel - rosuvastatin (CRESTOR) 40 MG tablet; Take 1 tablet (40 mg total) by mouth daily.  3. Essential hypertension Controlled. Continue current regime.  - amLODipine (NORVASC) 10 MG tablet; Take 1 tablet (10 mg total) by mouth daily.  4. Encounter for screening mammogram for malignant neoplasm of breast - MM Digital Screening; Future  Other orders - pioglitazone (ACTOS) 30 MG tablet; Take 1 tablet (30 mg total) by mouth daily. - metoprolol succinate (TOPROL-XL) 25 MG 24 hr tablet; Take 1 tablet (25 mg total) by mouth daily.  Return in about 3 months (around 10/30/2019).    [name], MD [facility] at [place], [place] [address] Ph.  [phone] Fax [phone]

## 2019-07-31 LAB — COMPREHENSIVE METABOLIC PANEL
ALT: 17 IU/L (ref 0–32)
AST: 19 IU/L (ref 0–40)
Albumin/Globulin Ratio: 1.7 (ref 1.2–2.2)
Albumin: 4.7 g/dL (ref 3.8–4.8)
Alkaline Phosphatase: 80 IU/L (ref 48–121)
BUN/Creatinine Ratio: 17 (ref 12–28)
BUN: 16 mg/dL (ref 8–27)
Bilirubin Total: 0.5 mg/dL (ref 0.0–1.2)
CO2: 23 mmol/L (ref 20–29)
Calcium: 9.8 mg/dL (ref 8.7–10.3)
Chloride: 102 mmol/L (ref 96–106)
Creatinine, Ser: 0.94 mg/dL (ref 0.57–1.00)
GFR calc Af Amer: 72 mL/min/{1.73_m2} (ref >59)
GFR calc non Af Amer: 63 mL/min/{1.73_m2} (ref >59)
Globulin, Total: 2.8 g/dL (ref 1.5–4.5)
Glucose: 154 mg/dL — ABNORMAL HIGH (ref 65–99)
Potassium: 4.5 mmol/L (ref 3.5–5.2)
Sodium: 142 mmol/L (ref 134–144)
Total Protein: 7.5 g/dL (ref 6.0–8.5)

## 2019-07-31 LAB — MICROALBUMIN / CREATININE URINE RATIO
Creatinine, Urine: 97.7 mg/dL
Microalb/Creat Ratio: 11 mg/g creat (ref 0–29)
Microalbumin, Urine: 11 ug/mL

## 2019-07-31 LAB — LIPID PANEL
Chol/HDL Ratio: 4.2 ratio (ref 0.0–4.4)
Cholesterol, Total: 210 mg/dL — ABNORMAL HIGH (ref 100–199)
HDL: 50 mg/dL (ref >39)
LDL Chol Calc (NIH): 132 mg/dL — ABNORMAL HIGH (ref 0–99)
Triglycerides: 158 mg/dL — ABNORMAL HIGH (ref 0–149)
VLDL Cholesterol Cal: 28 mg/dL (ref 5–40)

## 2019-07-31 LAB — HEMOGLOBIN A1C
Est. average glucose Bld gHb Est-mCnc: 214 mg/dL
Hgb A1c MFr Bld: 9.1 % — ABNORMAL HIGH (ref 4.8–5.6)

## 2019-08-02 ENCOUNTER — Emergency Department (HOSPITAL_COMMUNITY)
Admission: EM | Admit: 2019-08-02 | Discharge: 2019-08-02 | Disposition: A | Discharge status: Home / Self Care | Payer: PPO | Attending: Emergency Medicine | Admitting: Emergency Medicine

## 2019-08-02 ENCOUNTER — Emergency Department (HOSPITAL_COMMUNITY): Payer: PPO

## 2019-08-02 ENCOUNTER — Encounter (HOSPITAL_COMMUNITY): Payer: Self-pay

## 2019-08-02 DIAGNOSIS — S161XXA Strain of muscle, fascia and tendon at neck level, initial encounter: Secondary | ICD-10-CM | POA: Insufficient documentation

## 2019-08-02 DIAGNOSIS — M62838 Other muscle spasm: Secondary | ICD-10-CM | POA: Diagnosis not present

## 2019-08-02 DIAGNOSIS — I10 Essential (primary) hypertension: Secondary | ICD-10-CM | POA: Diagnosis not present

## 2019-08-02 DIAGNOSIS — M546 Pain in thoracic spine: Secondary | ICD-10-CM | POA: Insufficient documentation

## 2019-08-02 DIAGNOSIS — M47814 Spondylosis without myelopathy or radiculopathy, thoracic region: Secondary | ICD-10-CM | POA: Diagnosis not present

## 2019-08-02 DIAGNOSIS — Y929 Unspecified place or not applicable: Secondary | ICD-10-CM | POA: Insufficient documentation

## 2019-08-02 DIAGNOSIS — M25511 Pain in right shoulder: Secondary | ICD-10-CM | POA: Insufficient documentation

## 2019-08-02 DIAGNOSIS — E119 Type 2 diabetes mellitus without complications: Secondary | ICD-10-CM | POA: Diagnosis not present

## 2019-08-02 DIAGNOSIS — M25512 Pain in left shoulder: Secondary | ICD-10-CM | POA: Insufficient documentation

## 2019-08-02 DIAGNOSIS — R202 Paresthesia of skin: Secondary | ICD-10-CM | POA: Diagnosis not present

## 2019-08-02 DIAGNOSIS — Y939 Activity, unspecified: Secondary | ICD-10-CM | POA: Diagnosis not present

## 2019-08-02 DIAGNOSIS — M545 Low back pain: Secondary | ICD-10-CM | POA: Insufficient documentation

## 2019-08-02 DIAGNOSIS — Y999 Unspecified external cause status: Secondary | ICD-10-CM | POA: Diagnosis not present

## 2019-08-02 DIAGNOSIS — M62830 Muscle spasm of back: Secondary | ICD-10-CM | POA: Diagnosis not present

## 2019-08-02 DIAGNOSIS — S199XXA Unspecified injury of neck, initial encounter: Secondary | ICD-10-CM | POA: Diagnosis not present

## 2019-08-02 DIAGNOSIS — Z91010 Allergy to peanuts: Secondary | ICD-10-CM | POA: Diagnosis not present

## 2019-08-02 DIAGNOSIS — R519 Headache, unspecified: Secondary | ICD-10-CM | POA: Diagnosis not present

## 2019-08-02 DIAGNOSIS — Z7984 Long term (current) use of oral hypoglycemic drugs: Secondary | ICD-10-CM | POA: Diagnosis not present

## 2019-08-02 DIAGNOSIS — R200 Anesthesia of skin: Secondary | ICD-10-CM | POA: Diagnosis not present

## 2019-08-02 DIAGNOSIS — N200 Calculus of kidney: Secondary | ICD-10-CM | POA: Diagnosis not present

## 2019-08-02 DIAGNOSIS — I700 Atherosclerosis of aorta: Secondary | ICD-10-CM | POA: Diagnosis not present

## 2019-08-02 DIAGNOSIS — S299XXA Unspecified injury of thorax, initial encounter: Secondary | ICD-10-CM | POA: Diagnosis not present

## 2019-08-02 DIAGNOSIS — S0990XA Unspecified injury of head, initial encounter: Secondary | ICD-10-CM | POA: Diagnosis not present

## 2019-08-02 MED ORDER — IBUPROFEN 600 MG PO TABS: 600.0000 mg | ORAL_TABLET | Freq: Four times a day (QID) | ORAL | 0 refills | Status: DC | PRN

## 2019-08-02 MED ORDER — DIAZEPAM 5 MG PO TABS: 5.0000 mg | ORAL_TABLET | Freq: Two times a day (BID) | ORAL | 0 refills | Status: AC

## 2019-08-02 MED ORDER — ONDANSETRON 4 MG PO TBDP
4.0000 mg | ORAL_TABLET | Freq: Once | ORAL | Status: AC
  Administered 2019-08-02: 4 mg via ORAL
  Filled 2019-08-02: qty 1

## 2019-08-02 MED ORDER — IBUPROFEN 400 MG PO TABS
600.0000 mg | ORAL_TABLET | Freq: Once | ORAL | Status: AC
  Administered 2019-08-02: 600 mg via ORAL
  Filled 2019-08-02 (×2): qty 1

## 2019-08-02 MED ORDER — OXYCODONE-ACETAMINOPHEN 5-325 MG PO TABS
1.0000 | ORAL_TABLET | Freq: Once | ORAL | Status: AC
  Administered 2019-08-02: 1 via ORAL
  Filled 2019-08-02: qty 1

## 2019-08-02 NOTE — ED Notes (Signed)
Verbalized understanding of DC instructions, Rx, follow up care 
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
## 2019-08-08 ENCOUNTER — Encounter: Payer: Self-pay | Admitting: Family Medicine
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
## 2019-08-10 ENCOUNTER — Other Ambulatory Visit: Payer: Self-pay | Admitting: Family Medicine

## 2019-08-10 MED ORDER — SEMAGLUTIDE 3 MG PO TABS: 3.0000 mg | ORAL_TABLET | Freq: Every day | ORAL | 3 refills | Status: DC

## 2019-08-12 ENCOUNTER — Encounter: Payer: Self-pay | Admitting: Registered Nurse

## 2019-08-12 ENCOUNTER — Other Ambulatory Visit: Payer: Self-pay

## 2019-08-12 ENCOUNTER — Ambulatory Visit (INDEPENDENT_AMBULATORY_CARE_PROVIDER_SITE_OTHER): Payer: PPO | Admitting: Registered Nurse

## 2019-08-12 VITALS — BP 145/78 | HR 51 | Temp 97.6°F | Resp 17 | Ht 64.0 in | Wt 155.4 lb

## 2019-08-12 DIAGNOSIS — G44319 Acute post-traumatic headache, not intractable: Secondary | ICD-10-CM

## 2019-08-12 DIAGNOSIS — M25511 Pain in right shoulder: Secondary | ICD-10-CM

## 2019-08-12 MED ORDER — PREDNISONE 20 MG PO TABS: 20.0000 mg | ORAL_TABLET | Freq: Every day | ORAL | 0 refills | Status: DC

## 2019-08-12 MED ORDER — NORTRIPTYLINE HCL 25 MG PO CAPS: 25.0000 mg | ORAL_CAPSULE | Freq: Every day | ORAL | 1 refills | Status: DC

## 2019-08-12 NOTE — Patient Instructions (Signed)
° ° ° °  If you have lab work done today you will be contacted with your lab results within the next 2 weeks.  If you have not heard from us then please contact us. The fastest way to get your results is to register for My Chart. ° ° °IF you received an x-ray today, you will receive an invoice from [facility]. Please contact [facility] at [phone] with questions or concerns regarding your invoice.  ° °IF you received labwork today, you will receive an invoice from [facility]. Please contact [facility] at [phone] with questions or concerns regarding your invoice.  ° °Our billing staff will not be able to assist you with questions regarding bills from these companies. ° °You will be contacted with the lab results as soon as they are available. The fastest way to get your results is to activate your My Chart account. Instructions are located on the last page of this paperwork. If you have not heard from us regarding the results in 2 weeks, please contact this office. °  ° ° ° °
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
## 2019-08-14 ENCOUNTER — Encounter: Payer: Self-pay | Admitting: Radiology

## 2019-08-31 ENCOUNTER — Ambulatory Visit
Admission: RE | Admit: 2019-08-31 | Discharge: 2019-08-31 | Disposition: A | Discharge status: Home / Self Care | Payer: PPO | Source: Ambulatory Visit | Attending: Orthopedic Surgery | Admitting: Orthopedic Surgery

## 2019-08-31 DIAGNOSIS — M5412 Radiculopathy, cervical region: Secondary | ICD-10-CM

## 2019-08-31 DIAGNOSIS — M5011 Cervical disc disorder with radiculopathy,  high cervical region: Secondary | ICD-10-CM | POA: Diagnosis not present

## 2019-08-31 DIAGNOSIS — M4802 Spinal stenosis, cervical region: Secondary | ICD-10-CM | POA: Diagnosis not present

## 2019-08-31 DIAGNOSIS — M50121 Cervical disc disorder at C4-C5 level with radiculopathy: Secondary | ICD-10-CM | POA: Diagnosis not present

## 2019-08-31 DIAGNOSIS — M4722 Other spondylosis with radiculopathy, cervical region: Secondary | ICD-10-CM | POA: Diagnosis not present

## 2019-09-04 ENCOUNTER — Ambulatory Visit (INDEPENDENT_AMBULATORY_CARE_PROVIDER_SITE_OTHER): Payer: PPO | Admitting: Orthopedic Surgery

## 2019-09-04 ENCOUNTER — Encounter: Payer: Self-pay | Admitting: Orthopedic Surgery

## 2019-09-04 DIAGNOSIS — M5412 Radiculopathy, cervical region: Secondary | ICD-10-CM
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
## 2019-09-12 ENCOUNTER — Telehealth: Payer: Self-pay | Admitting: Physical Medicine and Rehabilitation

## 2019-09-12 NOTE — Telephone Encounter (Signed)
Called patient to advise that we do not currently have any later openings on that date for her appointment type. She will keep her appointment for now.

## 2019-09-12 NOTE — Telephone Encounter (Signed)
Patient called.  She wanted to know if her appointment time could be adjusted. She needs to come in a little later in the day  Call back: [phone]

## 2019-09-17 ENCOUNTER — Encounter: Payer: PPO | Admitting: Physical Medicine and Rehabilitation

## 2019-09-19 ENCOUNTER — Other Ambulatory Visit: Payer: Self-pay

## 2019-09-19 ENCOUNTER — Ambulatory Visit: Payer: Self-pay

## 2019-09-19 ENCOUNTER — Encounter: Payer: Self-pay | Admitting: Physical Medicine and Rehabilitation

## 2019-09-19 ENCOUNTER — Ambulatory Visit: Payer: PPO | Admitting: Physical Medicine and Rehabilitation

## 2019-09-19 VITALS — BP 149/87 | HR 71

## 2019-09-19 DIAGNOSIS — M5412 Radiculopathy, cervical region: Secondary | ICD-10-CM | POA: Diagnosis not present

## 2019-09-19 DIAGNOSIS — M4802 Spinal stenosis, cervical region: Secondary | ICD-10-CM

## 2019-09-19 MED ORDER — METHYLPREDNISOLONE ACETATE 80 MG/ML IJ SUSP: 80.0000 mg | Freq: Once | INTRAMUSCULAR | Status: DC

## 2019-09-19 MED ORDER — METHYLPREDNISOLONE ACETATE 80 MG/ML IJ SUSP
40.0000 mg | Freq: Once | INTRAMUSCULAR | Status: AC
  Administered 2019-09-19: 40 mg

## 2019-09-19 NOTE — Progress Notes (Signed)
Erin Benitez - 69 y.o. female MRN [id number]  Date of birth: Nov 13, 1950  Office Visit Note: Visit Date: 09/19/2019 PCP: [name], MD Referred by: [name], MD  Subjective: Chief Complaint  Patient presents with  . Right Shoulder - Pain  . Left Shoulder - Pain  . Left Arm - Pain  . Right Arm - Pain   HPI:  Erin Benitez is a 69 y.o. female who comes in today at the request of Dr. [name] for planned Left C7-T1 Cervical epidural steroid injection with fluoroscopic guidance.  The patient has failed conservative care including home exercise, medications, time and activity modification.  This injection will be diagnostic and hopefully therapeutic.  Please see requesting physician notes for further details and justification.   MRI reviewed with images and spine model.  MRI reviewed in the note below.  Patient also scheduled for electrodiagnostic study next month.  ROS Otherwise per HPI.  Assessment & Plan: Visit Diagnoses:  1. Cervical radiculopathy   2. Spinal stenosis of cervical region     Plan: No additional findings.   Meds & Orders:  Meds ordered this encounter  Medications  . DISCONTD: methylPREDNISolone acetate (DEPO-MEDROL) injection 80 mg  . methylPREDNISolone acetate (DEPO-MEDROL) injection 40 mg    Orders Placed This Encounter  Procedures  . XR C-ARM NO REPORT  . Epidural Steroid injection    Follow-up: Return if symptoms worsen or fail to improve.   Procedures: No procedures performed  Cervical Epidural Steroid Injection - Interlaminar Approach with Fluoroscopic Guidance  Patient: Erin Benitez      Date of Birth: Aug 29, 1950 MRN: [id number] PCP: [name], MD      Visit Date: 09/19/2019   Universal Protocol:    Date/Time: 08/19/218:38 AM  Consent Given By: the patient  Position: PRONE  Additional Comments: Vital signs were monitored before and after the procedure. Patient was prepped and draped in the usual sterile  fashion. The correct patient, procedure, and site was verified.   Injection Procedure Details:  Procedure Site One Meds Administered:  Meds ordered this encounter  Medications  . methylPREDNISolone acetate (DEPO-MEDROL) injection 80 mg     Laterality: Left  Location/Site: C7-T1  Needle size: 20 G  Needle type: Touhy  Needle Placement: Paramedian epidural space  Findings:  -Comments: Excellent flow of contrast into the epidural space.  Procedure Details: Using a paramedian approach from the side mentioned above, the region overlying the inferior lamina was localized under fluoroscopic visualization and the soft tissues overlying this structure were infiltrated with 4 ml. of 1% Lidocaine without Epinephrine. A # 20 gauge, Tuohy needle was inserted into the epidural space using a paramedian approach.  The epidural space was localized using loss of resistance along with lateral and contralateral oblique bi-planar fluoroscopic views.  After negative aspirate for air, blood, and CSF, a 2 ml. volume of Isovue-250 was injected into the epidural space and the flow of contrast was observed. Radiographs were obtained for documentation purposes.   The injectate was administered into the level noted above.  Additional Comments:  The patient tolerated the procedure well Dressing: 2 x 2 sterile gauze and Band-Aid    Post-procedure details: Patient was observed during the procedure. Post-procedure instructions were reviewed.  Patient left the clinic in stable condition.     Clinical History: Neck pain radiating to both shoulders. Intermittent hand numbness. Symptoms began in March of this year.  EXAM: MRI CERVICAL SPINE WITHOUT CONTRAST  TECHNIQUE: Multiplanar,  multisequence MR imaging of the cervical spine was performed. No intravenous contrast was administered.  COMPARISON:  CT 08/02/2019  FINDINGS: Alignment: Normal  Vertebrae: No fracture or primary bone  lesion.  Cord: No cord compression or primary cord lesion.  Posterior Fossa, vertebral arteries, paraspinal tissues: Normal  Disc levels:  Foramen magnum is widely patent.  C1-2 and C2-3 levels are normal.  C3-4: Mild bulging of the disc. Mild foraminal narrowing due to uncovertebral prominence. No likely compressive stenosis.  C4-5: Shallow disc protrusion effaces the ventral subarachnoid space but does not compress the cord. AP diameter of the canal in the midline is 7.9 mm. Mild bilateral foraminal narrowing, not grossly compressive.  C5-6: Bulging of the disc and uncovertebral prominence. No central canal stenosis. Bilateral bony foraminal narrowing right more than left. Some potential that this could affect the right C6 nerve.  C6-7: Bulging of the disc. No canal stenosis. Mild bilateral foraminal narrowing.  C7-T1: Minimal disc bulge. No facet arthropathy. No canal or foraminal stenosis.  Upper thoracic region is negative.  IMPRESSION: C3-4: Mild spondylosis. Mild bilateral foraminal narrowing, not likely compressive.  C4-5: Shallow disc protrusion. Canal narrowing with AP diameter of 7.9 mm, but without visible cord compression. Mild bilateral foraminal stenosis.  C5-6: Spondylosis with mild foraminal narrowing on the right. No definite neural compression, but the right C6 nerve could possibly be affected.  C6-7: Spondylosis with mild bilateral bony foraminal narrowing, not likely compressive.   Electronically Signed   By: Nelson Chimes M.D.   On: 08/31/2019 14:11     Objective:  VS:  HT:    WT:   BMI:     BP:(!) 149/87  HR:71bpm  TEMP: ( )  RESP:  Physical Exam Vitals and nursing note reviewed.  Constitutional:      General: She is not in acute distress.    Appearance: Normal appearance. She is not ill-appearing.  HENT:     Head: Normocephalic and atraumatic.     Right Ear: External ear normal.     Left Ear: External ear  normal.  Eyes:     Extraocular Movements: Extraocular movements intact.  Cardiovascular:     Rate and Rhythm: Normal rate.     Pulses: Normal pulses.  Musculoskeletal:     Cervical back: Tenderness present. No rigidity.     Right lower leg: No edema.     Left lower leg: No edema.     Comments: Patient has good strength in the upper extremities including 5 out of 5 strength in wrist extension long finger flexion and APB.  There is no atrophy of the hands intrinsically.  There is a negative Hoffmann's test.   Lymphadenopathy:     Cervical: No cervical adenopathy.  Skin:    Findings: No erythema, lesion or rash.  Neurological:     General: No focal deficit present.     Mental Status: She is alert and oriented to person, place, and time.     Sensory: No sensory deficit.     Motor: No weakness or abnormal muscle tone.     Coordination: Coordination normal.  Psychiatric:        Mood and Affect: Mood normal.        Behavior: Behavior normal.      Imaging: No results found.

## 2019-09-19 NOTE — Procedures (Signed)
Cervical Epidural Steroid Injection - Interlaminar Approach with Fluoroscopic Guidance  Patient: Erin Benitez      Date of Birth: 17-Aug-1950 MRN: [id number] PCP: [name], MD      Visit Date: 09/19/2019   Universal Protocol:    Date/Time: 08/19/218:38 AM  Consent Given By: the patient  Position: PRONE  Additional Comments: Vital signs were monitored before and after the procedure. Patient was prepped and draped in the usual sterile fashion. The correct patient, procedure, and site was verified.   Injection Procedure Details:  Procedure Site One Meds Administered:  Meds ordered this encounter  Medications  . methylPREDNISolone acetate (DEPO-MEDROL) injection 80 mg     Laterality: Left  Location/Site: C7-T1  Needle size: 20 G  Needle type: Touhy  Needle Placement: Paramedian epidural space  Findings:  -Comments: Excellent flow of contrast into the epidural space.  Procedure Details: Using a paramedian approach from the side mentioned above, the region overlying the inferior lamina was localized under fluoroscopic visualization and the soft tissues overlying this structure were infiltrated with 4 ml. of 1% Lidocaine without Epinephrine. A # 20 gauge, Tuohy needle was inserted into the epidural space using a paramedian approach.  The epidural space was localized using loss of resistance along with lateral and contralateral oblique bi-planar fluoroscopic views.  After negative aspirate for air, blood, and CSF, a 2 ml. volume of Isovue-250 was injected into the epidural space and the flow of contrast was observed. Radiographs were obtained for documentation purposes.   The injectate was administered into the level noted above.  Additional Comments:  The patient tolerated the procedure well Dressing: 2 x 2 sterile gauze and Band-Aid    Post-procedure details: Patient was observed during the procedure. Post-procedure instructions were reviewed.  Patient left the  clinic in stable condition.

## 2019-09-19 NOTE — Progress Notes (Signed)
Pt states both shoulder and arm pain. Pt states sleeping at night makes the pain worse. Pt states during the day its not bad.  Numeric Pain Rating Scale and Functional Assessment Average Pain 8   In the last MONTH (on 0-10 scale) has pain interfered with the following?  1. General activity like being  able to carry out your everyday physical activities such as walking, climbing stairs, carrying groceries, or moving a chair?  Rating(5)   +Driver, -BT, -Dye Allergies.

## 2019-10-18 ENCOUNTER — Encounter: Payer: Self-pay | Admitting: Physical Medicine and Rehabilitation

## 2019-10-18 ENCOUNTER — Ambulatory Visit (INDEPENDENT_AMBULATORY_CARE_PROVIDER_SITE_OTHER): Payer: PPO | Admitting: Physical Medicine and Rehabilitation

## 2019-10-18 ENCOUNTER — Other Ambulatory Visit: Payer: Self-pay

## 2019-10-18 DIAGNOSIS — R202 Paresthesia of skin: Secondary | ICD-10-CM | POA: Diagnosis not present

## 2019-10-18 NOTE — Progress Notes (Signed)
No pain for about a week following injection. Now having pain in both arms. Right arm worse. Right hand dominant.  Vaseline on ankles. Numeric Pain Rating Scale and Functional Assessment Average Pain 7   In the last MONTH (on 0-10 scale) has pain interfered with the following?  1. General activity like being  able to carry out your everyday physical activities such as walking, climbing stairs, carrying groceries, or moving a chair?  Rating(9)

## 2019-10-22 NOTE — Procedures (Signed)
EMG & NCV Findings: Evaluation of the right median motor nerve showed prolonged distal onset latency (4.3 ms), reduced amplitude (4.9 mV), and decreased conduction velocity (Elbow-Wrist, 48 m/s).  The left median (across palm) sensory and the right median (across palm) sensory nerves showed prolonged distal peak latency (Wrist, L4.2, R4.4 ms) and prolonged distal peak latency (Palm, L3.5, R4.3 ms).  All remaining nerves (as indicated in the following tables) were within normal limits.  All left vs. right side differences were within normal limits.    All examined muscles (as indicated in the following table) showed no evidence of electrical instability.    Impression: The above electrodiagnostic study is ABNORMAL and reveals evidence of:  1.  A  moderate right median nerve entrapment at the wrist (carpal tunnel syndrome) affecting sensory and motor components.   2.  A mild to moderate left median nerve entrapment at the wrist (carpal tunnel syndrome) affecting sensory components.   There is no significant electrodiagnostic evidence of any other focal nerve entrapment, brachial plexopathy or cervical radiculopathy.   Recommendations: 1.  Follow-up with referring physician. 2.  Continue current management of symptoms. 3.  Continue use of resting splint at night-time and as needed during the day. 4.  Suggest surgical evaluation.  ___________________________ [name] FAAPMR Board Certified, American Board of Physical Medicine and Rehabilitation    Nerve Conduction Studies Anti Sensory Summary Table   Stim Site NR Peak (ms) Norm Peak (ms) P-T Amp (V) Norm P-T Amp Site1 Site2 Delta-P (ms) Dist (cm) Vel (m/s) Norm Vel (m/s)  Left Median Acr Palm Anti Sensory (2nd Digit)  30.4C  Wrist    *4.2 <3.6 20.7 >10 Wrist Palm 0.7 0.0    Palm    *3.5 <2.0 4.5         Right Median Acr Palm Anti Sensory (2nd Digit)  29.9C  Wrist    *4.4 <3.6 21.3 >10 Wrist Palm 0.1 0.0    Palm    *4.3 <2.0 3.5          Left Radial Anti Sensory (Base 1st Digit)  30.4C  Wrist    2.5 <3.1 5.9  Wrist Base 1st Digit 2.5 0.0    Right Radial Anti Sensory (Base 1st Digit)  29.3C  Wrist    2.4 <3.1 9.9  Wrist Base 1st Digit 2.4 0.0    Left Ulnar Anti Sensory (5th Digit)  30.8C  Wrist    3.6 <3.7 15.7 >15.0 Wrist 5th Digit 3.6 14.0 39 >38  Right Ulnar Anti Sensory (5th Digit)  29.8C  Wrist    3.3 <3.7 18.6 >15.0 Wrist 5th Digit 3.3 14.0 42 >38   Motor Summary Table   Stim Site NR Onset (ms) Norm Onset (ms) O-P Amp (mV) Norm O-P Amp Site1 Site2 Delta-0 (ms) Dist (cm) Vel (m/s) Norm Vel (m/s)  Left Median Motor (Abd Poll Brev)  30.5C  Wrist    4.1 <4.2 5.8 >5 Elbow Wrist 4.2 21.0 50 >50  Elbow    8.3  5.6         Right Median Motor (Abd Poll Brev)  29.3C  Wrist    *4.3 <4.2 *4.9 >5 Elbow Wrist 4.1 19.5 *48 >50  Elbow    8.4  2.7         Left Ulnar Motor (Abd Dig Min)  30.8C  Wrist    3.0 <4.2 6.8 >3 B Elbow Wrist 3.6 19.0 53 >53  B Elbow    6.6  6.1  A  Elbow B Elbow 1.7 10.0 59 >53  A Elbow    8.3  5.6         Right Ulnar Motor (Abd Dig Min)  29.3C  Wrist    2.6 <4.2 8.8 >3 B Elbow Wrist 3.7 19.5 53 >53  B Elbow    6.3  7.5  A Elbow B Elbow 1.9 10.0 53 >53  A Elbow    8.2  7.2          EMG   Side Muscle Nerve Root Ins Act Fibs Psw Amp Dur Poly Recrt Int Fraser Din Comment  Right Abd Poll Brev Median C8-T1 Nml Nml Nml Nml Nml 0 Nml Nml   Right 1stDorInt Ulnar C8-T1 Nml Nml Nml Nml Nml 0 Nml Nml   Right PronatorTeres Median C6-7 Nml Nml Nml Nml Nml 0 Nml Nml   Right Biceps Musculocut C5-6 Nml Nml Nml Nml Nml 0 Nml Nml   Right Deltoid Axillary C5-6 Nml Nml Nml Nml Nml 0 Nml Nml     Nerve Conduction Studies Anti Sensory Left/Right Comparison   Stim Site L Lat (ms) R Lat (ms) L-R Lat (ms) L Amp (V) R Amp (V) L-R Amp (%) Site1 Site2 L Vel (m/s) R Vel (m/s) L-R Vel (m/s)  Median Acr Palm Anti Sensory (2nd Digit)  30.4C  Wrist *4.2 *4.4 0.2 20.7 21.3 2.8 Wrist Palm     Palm *3.5 *4.3 0.8 4.5 3.5  22.2       Radial Anti Sensory (Base 1st Digit)  30.4C  Wrist 2.5 2.4 0.1 5.9 9.9 40.4 Wrist Base 1st Digit     Ulnar Anti Sensory (5th Digit)  30.8C  Wrist 3.6 3.3 0.3 15.7 18.6 15.6 Wrist 5th Digit 39 42 3   Motor Left/Right Comparison   Stim Site L Lat (ms) R Lat (ms) L-R Lat (ms) L Amp (mV) R Amp (mV) L-R Amp (%) Site1 Site2 L Vel (m/s) R Vel (m/s) L-R Vel (m/s)  Median Motor (Abd Poll Brev)  30.5C  Wrist 4.1 *4.3 0.2 5.8 *4.9 15.5 Elbow Wrist 50 *48 2  Elbow 8.3 8.4 0.1 5.6 2.7 51.8       Ulnar Motor (Abd Dig Min)  30.8C  Wrist 3.0 2.6 0.4 6.8 8.8 22.7 B Elbow Wrist 53 53 0  B Elbow 6.6 6.3 0.3 6.1 7.5 18.7 A Elbow B Elbow 59 53 6  A Elbow 8.3 8.2 0.1 5.6 7.2 22.2          Waveforms:

## 2019-10-22 NOTE — Progress Notes (Signed)
Erin Benitez - 69 y.o. female MRN [id number]  Date of birth: Mar 21, 1950  Office Visit Note: Visit Date: 10/18/2019 PCP: [name], MD Referred by: [name], MD  Subjective: Chief Complaint  Patient presents with  . Right Arm - Pain  . Left Arm - Pain   HPI:  Erin Benitez is a 69 y.o. female who comes in today at the request of Dr. [name] for electrodiagnostic study of the Bilateral upper extremities.  Patient is Right hand dominant.  She reports progressive worsening neck pain with bilateral shoulder pain but also with numbness tingling and paresthesia in both hands right more than left sometimes left left more than right.  She is a type II diabetic but not insulin-dependent.  He does not carry a dietary diagnosis of polyneuropathy.  She has had no prior electrodiagnostic study.  She has been managed by Dr. [name].  MRI of the cervical spine is reviewed below and we recently saw the patient for evaluation of actually completed cervical epidural injection.  She reports cervical epidural injection gave her a lot of relief of her neck and shoulder pain but now her shoulders and arms are hurting once again and it does seem to help for about a week.  Her symptoms are worse at night and worse with activity and position.  This is symptoms of the hands.  She does wear splint at night has helped to a degree.  Her average pain is 7 out of 10.  ROS Otherwise per HPI.  Assessment & Plan: Visit Diagnoses:  1. Paresthesia of skin     Plan: Impression: The above electrodiagnostic study is ABNORMAL and reveals evidence of:  1.  A  moderate right median nerve entrapment at the wrist (carpal tunnel syndrome) affecting sensory and motor components.   2.  A mild to moderate left median nerve entrapment at the wrist (carpal tunnel syndrome) affecting sensory components.   There is no significant electrodiagnostic evidence of any other focal nerve entrapment, brachial plexopathy or  cervical radiculopathy.   Recommendations: 1.  Follow-up with referring physician. 2.  Continue current management of symptoms. 3.  Continue use of resting splint at night-time and as needed during the day. 4.  Suggest surgical evaluation.    Meds & Orders: No orders of the defined types were placed in this encounter.   Orders Placed This Encounter  Procedures  . NCV with EMG (electromyography)    Follow-up: Return in about 2 weeks (around 11/01/2019) for  [name], M.D..   Procedures: No procedures performed  EMG & NCV Findings: Evaluation of the right median motor nerve showed prolonged distal onset latency (4.3 ms), reduced amplitude (4.9 mV), and decreased conduction velocity (Elbow-Wrist, 48 m/s).  The left median (across palm) sensory and the right median (across palm) sensory nerves showed prolonged distal peak latency (Wrist, L4.2, R4.4 ms) and prolonged distal peak latency (Palm, L3.5, R4.3 ms).  All remaining nerves (as indicated in the following tables) were within normal limits.  All left vs. right side differences were within normal limits.    All examined muscles (as indicated in the following table) showed no evidence of electrical instability.    Impression: The above electrodiagnostic study is ABNORMAL and reveals evidence of:  1.  A  moderate right median nerve entrapment at the wrist (carpal tunnel syndrome) affecting sensory and motor components.   2.  A mild to moderate left median nerve entrapment at the wrist (  carpal tunnel syndrome) affecting sensory components.   There is no significant electrodiagnostic evidence of any other focal nerve entrapment, brachial plexopathy or cervical radiculopathy.   Recommendations: 1.  Follow-up with referring physician. 2.  Continue current management of symptoms. 3.  Continue use of resting splint at night-time and as needed during the day. 4.  Suggest surgical evaluation.  ___________________________ [name]  FAAPMR Board Certified, American Board of Physical Medicine and Rehabilitation    Nerve Conduction Studies Anti Sensory Summary Table   Stim Site NR Peak (ms) Norm Peak (ms) P-T Amp (V) Norm P-T Amp Site1 Site2 Delta-P (ms) Dist (cm) Vel (m/s) Norm Vel (m/s)  Left Median Acr Palm Anti Sensory (2nd Digit)  30.4C  Wrist    *4.2 <3.6 20.7 >10 Wrist Palm 0.7 0.0    Palm    *3.5 <2.0 4.5         Right Median Acr Palm Anti Sensory (2nd Digit)  29.9C  Wrist    *4.4 <3.6 21.3 >10 Wrist Palm 0.1 0.0    Palm    *4.3 <2.0 3.5         Left Radial Anti Sensory (Base 1st Digit)  30.4C  Wrist    2.5 <3.1 5.9  Wrist Base 1st Digit 2.5 0.0    Right Radial Anti Sensory (Base 1st Digit)  29.3C  Wrist    2.4 <3.1 9.9  Wrist Base 1st Digit 2.4 0.0    Left Ulnar Anti Sensory (5th Digit)  30.8C  Wrist    3.6 <3.7 15.7 >15.0 Wrist 5th Digit 3.6 14.0 39 >38  Right Ulnar Anti Sensory (5th Digit)  29.8C  Wrist    3.3 <3.7 18.6 >15.0 Wrist 5th Digit 3.3 14.0 42 >38   Motor Summary Table   Stim Site NR Onset (ms) Norm Onset (ms) O-P Amp (mV) Norm O-P Amp Site1 Site2 Delta-0 (ms) Dist (cm) Vel (m/s) Norm Vel (m/s)  Left Median Motor (Abd Poll Brev)  30.5C  Wrist    4.1 <4.2 5.8 >5 Elbow Wrist 4.2 21.0 50 >50  Elbow    8.3  5.6         Right Median Motor (Abd Poll Brev)  29.3C  Wrist    *4.3 <4.2 *4.9 >5 Elbow Wrist 4.1 19.5 *48 >50  Elbow    8.4  2.7         Left Ulnar Motor (Abd Dig Min)  30.8C  Wrist    3.0 <4.2 6.8 >3 B Elbow Wrist 3.6 19.0 53 >53  B Elbow    6.6  6.1  A Elbow B Elbow 1.7 10.0 59 >53  A Elbow    8.3  5.6         Right Ulnar Motor (Abd Dig Min)  29.3C  Wrist    2.6 <4.2 8.8 >3 B Elbow Wrist 3.7 19.5 53 >53  B Elbow    6.3  7.5  A Elbow B Elbow 1.9 10.0 53 >53  A Elbow    8.2  7.2          EMG   Side Muscle Nerve Root Ins Act Fibs Psw Amp Dur Poly Recrt Int Fraser Din Comment  Right Abd Poll Brev Median C8-T1 Nml Nml Nml Nml Nml 0 Nml Nml   Right 1stDorInt Ulnar C8-T1 Nml Nml  Nml Nml Nml 0 Nml Nml   Right PronatorTeres Median C6-7 Nml Nml Nml Nml Nml 0 Nml Nml   Right Biceps Musculocut C5-6 Nml Nml Nml  Nml Nml 0 Nml Nml   Right Deltoid Axillary C5-6 Nml Nml Nml Nml Nml 0 Nml Nml     Nerve Conduction Studies Anti Sensory Left/Right Comparison   Stim Site L Lat (ms) R Lat (ms) L-R Lat (ms) L Amp (V) R Amp (V) L-R Amp (%) Site1 Site2 L Vel (m/s) R Vel (m/s) L-R Vel (m/s)  Median Acr Palm Anti Sensory (2nd Digit)  30.4C  Wrist *4.2 *4.4 0.2 20.7 21.3 2.8 Wrist Palm     Palm *3.5 *4.3 0.8 4.5 3.5 22.2       Radial Anti Sensory (Base 1st Digit)  30.4C  Wrist 2.5 2.4 0.1 5.9 9.9 40.4 Wrist Base 1st Digit     Ulnar Anti Sensory (5th Digit)  30.8C  Wrist 3.6 3.3 0.3 15.7 18.6 15.6 Wrist 5th Digit 39 42 3   Motor Left/Right Comparison   Stim Site L Lat (ms) R Lat (ms) L-R Lat (ms) L Amp (mV) R Amp (mV) L-R Amp (%) Site1 Site2 L Vel (m/s) R Vel (m/s) L-R Vel (m/s)  Median Motor (Abd Poll Brev)  30.5C  Wrist 4.1 *4.3 0.2 5.8 *4.9 15.5 Elbow Wrist 50 *48 2  Elbow 8.3 8.4 0.1 5.6 2.7 51.8       Ulnar Motor (Abd Dig Min)  30.8C  Wrist 3.0 2.6 0.4 6.8 8.8 22.7 B Elbow Wrist 53 53 0  B Elbow 6.6 6.3 0.3 6.1 7.5 18.7 A Elbow B Elbow 59 53 6  A Elbow 8.3 8.2 0.1 5.6 7.2 22.2          Waveforms:                      Clinical History: Neck pain radiating to both shoulders. Intermittent hand numbness. Symptoms began in March of this year.  EXAM: MRI CERVICAL SPINE WITHOUT CONTRAST  TECHNIQUE: Multiplanar, multisequence MR imaging of the cervical spine was performed. No intravenous contrast was administered.  COMPARISON:  CT 08/02/2019  FINDINGS: Alignment: Normal  Vertebrae: No fracture or primary bone lesion.  Cord: No cord compression or primary cord lesion.  Posterior Fossa, vertebral arteries, paraspinal tissues: Normal  Disc levels:  Foramen magnum is widely patent.  C1-2 and C2-3 levels are normal.  C3-4: Mild bulging  of the disc. Mild foraminal narrowing due to uncovertebral prominence. No likely compressive stenosis.  C4-5: Shallow disc protrusion effaces the ventral subarachnoid space but does not compress the cord. AP diameter of the canal in the midline is 7.9 mm. Mild bilateral foraminal narrowing, not grossly compressive.  C5-6: Bulging of the disc and uncovertebral prominence. No central canal stenosis. Bilateral bony foraminal narrowing right more than left. Some potential that this could affect the right C6 nerve.  C6-7: Bulging of the disc. No canal stenosis. Mild bilateral foraminal narrowing.  C7-T1: Minimal disc bulge. No facet arthropathy. No canal or foraminal stenosis.  Upper thoracic region is negative.  IMPRESSION: C3-4: Mild spondylosis. Mild bilateral foraminal narrowing, not likely compressive.  C4-5: Shallow disc protrusion. Canal narrowing with AP diameter of 7.9 mm, but without visible cord compression. Mild bilateral foraminal stenosis.  C5-6: Spondylosis with mild foraminal narrowing on the right. No definite neural compression, but the right C6 nerve could possibly be affected.  C6-7: Spondylosis with mild bilateral bony foraminal narrowing, not likely compressive.   Electronically Signed   By: Nelson Chimes M.D.   On: 08/31/2019 14:11     Objective:  VS:  HT:  WT:   BMI:     BP:   HR: bpm  TEMP: ( )  RESP:  Physical Exam Musculoskeletal:        General: No swelling, tenderness or deformity.     Comments: Inspection reveals no atrophy of the bilateral APB or FDI or hand intrinsics. There is no swelling, color changes, allodynia or dystrophic changes. There is 5 out of 5 strength in the bilateral wrist extension, finger abduction and long finger flexion. There is intact sensation to light touch in all dermatomal and peripheral nerve distributions.  There is a positive Phalen's test bilaterally. There is a negative Hoffmann's test  bilaterally.  Skin:    General: Skin is warm and dry.     Findings: No erythema or rash.  Neurological:     General: No focal deficit present.     Mental Status: She is alert and oriented to person, place, and time.     Motor: No weakness or abnormal muscle tone.     Coordination: Coordination normal.  Psychiatric:        Mood and Affect: Mood normal.        Behavior: Behavior normal.      Imaging: No results found.

## 2019-10-25 DIAGNOSIS — H524 Presbyopia: Secondary | ICD-10-CM | POA: Diagnosis not present

## 2019-10-25 DIAGNOSIS — Z961 Presence of intraocular lens: Secondary | ICD-10-CM | POA: Diagnosis not present

## 2019-10-25 DIAGNOSIS — E119 Type 2 diabetes mellitus without complications: Secondary | ICD-10-CM | POA: Diagnosis not present

## 2019-10-25 DIAGNOSIS — H35033 Hypertensive retinopathy, bilateral: Secondary | ICD-10-CM | POA: Diagnosis not present

## 2019-10-25 DIAGNOSIS — H40013 Open angle with borderline findings, low risk, bilateral: Secondary | ICD-10-CM | POA: Diagnosis not present

## 2019-10-25 LAB — HM DIABETES EYE EXAM

## 2019-10-28 ENCOUNTER — Other Ambulatory Visit: Payer: Self-pay

## 2019-10-28 ENCOUNTER — Ambulatory Visit
Admission: RE | Admit: 2019-10-28 | Discharge: 2019-10-28 | Disposition: A | Discharge status: Home / Self Care | Payer: PPO | Source: Ambulatory Visit | Attending: Family Medicine | Admitting: Family Medicine

## 2019-10-28 DIAGNOSIS — Z1231 Encounter for screening mammogram for malignant neoplasm of breast: Secondary | ICD-10-CM

## 2019-10-31 ENCOUNTER — Other Ambulatory Visit: Payer: Self-pay

## 2019-10-31 ENCOUNTER — Ambulatory Visit (INDEPENDENT_AMBULATORY_CARE_PROVIDER_SITE_OTHER): Payer: PPO | Admitting: Family Medicine

## 2019-10-31 ENCOUNTER — Encounter: Payer: Self-pay | Admitting: Family Medicine

## 2019-10-31 VITALS — BP 135/80 | HR 74 | Temp 97.3°F | Ht 64.0 in | Wt 149.6 lb

## 2019-10-31 DIAGNOSIS — E119 Type 2 diabetes mellitus without complications: Secondary | ICD-10-CM

## 2019-10-31 DIAGNOSIS — Z23 Encounter for immunization: Secondary | ICD-10-CM | POA: Diagnosis not present

## 2019-10-31 DIAGNOSIS — I10 Essential (primary) hypertension: Secondary | ICD-10-CM

## 2019-10-31 DIAGNOSIS — M5412 Radiculopathy, cervical region: Secondary | ICD-10-CM

## 2019-10-31 DIAGNOSIS — K219 Gastro-esophageal reflux disease without esophagitis: Secondary | ICD-10-CM

## 2019-10-31 DIAGNOSIS — G44229 Chronic tension-type headache, not intractable: Secondary | ICD-10-CM

## 2019-10-31 DIAGNOSIS — E782 Mixed hyperlipidemia: Secondary | ICD-10-CM | POA: Diagnosis not present

## 2019-10-31 LAB — COMPREHENSIVE METABOLIC PANEL
ALT: 13 IU/L (ref 0–32)
AST: 14 IU/L (ref 0–40)
Albumin/Globulin Ratio: 2 (ref 1.2–2.2)
Albumin: 4.9 g/dL — ABNORMAL HIGH (ref 3.8–4.8)
Alkaline Phosphatase: 85 IU/L (ref 44–121)
BUN/Creatinine Ratio: 23 (ref 12–28)
BUN: 29 mg/dL — ABNORMAL HIGH (ref 8–27)
Bilirubin Total: 0.2 mg/dL (ref 0.0–1.2)
CO2: 23 mmol/L (ref 20–29)
Calcium: 10.2 mg/dL (ref 8.7–10.3)
Chloride: 99 mmol/L (ref 96–106)
Creatinine, Ser: 1.27 mg/dL — ABNORMAL HIGH (ref 0.57–1.00)
GFR calc Af Amer: 50 mL/min/{1.73_m2} — ABNORMAL LOW (ref >59)
GFR calc non Af Amer: 43 mL/min/{1.73_m2} — ABNORMAL LOW (ref >59)
Globulin, Total: 2.5 g/dL (ref 1.5–4.5)
Glucose: 175 mg/dL — ABNORMAL HIGH (ref 65–99)
Potassium: 4.6 mmol/L (ref 3.5–5.2)
Sodium: 139 mmol/L (ref 134–144)
Total Protein: 7.4 g/dL (ref 6.0–8.5)

## 2019-10-31 LAB — POCT GLYCOSYLATED HEMOGLOBIN (HGB A1C): Hemoglobin A1C: 8.6 % — AB (ref 4.0–5.6)

## 2019-10-31 MED ORDER — TRIAMTERENE-HCTZ 37.5-25 MG PO TABS: 1.0000 | ORAL_TABLET | Freq: Every day | ORAL | 1 refills | Status: DC

## 2019-10-31 MED ORDER — ESOMEPRAZOLE MAGNESIUM 40 MG PO PACK: 40.0000 mg | PACK | Freq: Every day | ORAL | 1 refills | Status: DC

## 2019-10-31 MED ORDER — ROSUVASTATIN CALCIUM 40 MG PO TABS: 40.0000 mg | ORAL_TABLET | Freq: Every day | ORAL | 3 refills | Status: DC

## 2019-10-31 MED ORDER — NORTRIPTYLINE HCL 25 MG PO CAPS: 25.0000 mg | ORAL_CAPSULE | Freq: Every day | ORAL | 1 refills | Status: DC

## 2019-10-31 MED ORDER — GABAPENTIN 300 MG PO CAPS: 300.0000 mg | ORAL_CAPSULE | Freq: Every day | ORAL | 0 refills | Status: DC

## 2019-10-31 MED ORDER — GLIPIZIDE 10 MG PO TABS: 10.0000 mg | ORAL_TABLET | Freq: Every day | ORAL | 1 refills | Status: DC

## 2019-10-31 MED ORDER — METOPROLOL SUCCINATE ER 25 MG PO TB24: 25.0000 mg | ORAL_TABLET | Freq: Every day | ORAL | 1 refills | Status: DC

## 2019-10-31 MED ORDER — AMLODIPINE BESYLATE 10 MG PO TABS: 10.0000 mg | ORAL_TABLET | Freq: Every day | ORAL | 1 refills | Status: DC

## 2019-10-31 MED ORDER — PIOGLITAZONE HCL 30 MG PO TABS: 30.0000 mg | ORAL_TABLET | Freq: Every day | ORAL | 1 refills | Status: DC

## 2019-10-31 MED ORDER — EZETIMIBE 10 MG PO TABS: 10.0000 mg | ORAL_TABLET | Freq: Every day | ORAL | 3 refills | Status: DC

## 2019-10-31 MED ORDER — SEMAGLUTIDE 7 MG PO TABS: 7.0000 mg | ORAL_TABLET | Freq: Every day | ORAL | 1 refills | Status: DC

## 2019-10-31 NOTE — Progress Notes (Signed)
9/30/20219:11 AM  Erin Benitez 1950-07-20, 69 y.o., female [id number]  Chief Complaint  Patient presents with  . Follow-up    dm, htn, hlp, wants rx for shingle vacc, wants to discuss the rybelsus. Says she takes the med occasionally. Still having trouble sleeping    HPI:   Patient is a 69 y.o. female with past medical history significant for HTN, DM2, GERD, HLP, diverticulosiswho presents today forroutiine followup  Last OV June 2021 - added rybelsus 80m daily and zetia  She is not doing ok She continues to struggle with pain from cervical radiculopathy She was getting better but now worse , L > R ,down into her lower back Last night unable to sleep, crying due to pain Struggling with toileting/cleaning Light duty at work Cont to see Dr DMarlou SaHas been cutting back on sugars She is tolerating rybelsus wo issues She reports that she takes glipizide 123monce a day She reports she is not taking celebrex nor methocarbamol as not helping with her pain Requesting flu vaccine today Reports HA well controlled on pamelor Reports GERD well controlled on nexium  Wt Readings from Last 3 Encounters:  10/31/19 149 lb 9.6 oz (67.9 kg)  08/12/19 155 lb 6.4 oz (70.5 kg)  08/02/19 153 lb (69.4 kg)   BP Readings from Last 3 Encounters:  10/31/19 135/80  09/19/19 (!) 149/87  08/12/19 (!) 145/78   Lab Results  Component Value Date   HGBA1C 9.1 (H) 07/30/2019   HGBA1C 7.7 (H) 04/29/2019   HGBA1C 7.6 (A) 01/28/2019   Lab Results  Component Value Date   LDLCALC 132 (H) 07/30/2019   CREATININE 0.94 07/30/2019    Depression screen PHQ 2/9 08/12/2019 07/30/2019 07/23/2019  Decreased Interest 0 0 0  Down, Depressed, Hopeless 0 0 0  PHQ - 2 Score 0 0 0    Fall Risk  10/31/2019 08/12/2019 07/30/2019 07/23/2019 04/29/2019  Falls in the past year? 0 0 0 0 0  Number falls in past yr: 0 0 0 0 0  Injury with Fall? 0 0 0 0 0  Follow up - Falls evaluation completed - Falls evaluation  completed;Education provided Falls evaluation completed     Allergies  Allergen Reactions  . Lisinopril Swelling  . Peanut-Containing Drug Products Anaphylaxis, Hives, Swelling and Other (See Comments)    Pistachio's - swelling of the throat     . Tramadol Nausea And Vomiting    Headache & dizziness with n&v    Prior to Admission medications   Medication Sig Start Date Benitez Date Taking? Authorizing Provider  albuterol (VENTOLIN HFA) 108 (90 Base) MCG/ACT inhaler Inhale 2 puffs into the lungs every 6 (six) hours as needed for wheezing or shortness of breath. 05/28/18  Yes Hawks, Christy A, FNP  amLODipine (NORVASC) 10 MG tablet Take 1 tablet (10 mg total) by mouth daily. 07/30/19  Yes SaRutherford GuysMD  blood glucose meter kit and supplies Per insurance preference. Check glucose once a day, alternate between fasting and 2 hours after dinner. 07/18/18  Yes SaRutherford GuysMD  Blood Pressure Monitor KIT 1 Units by Does not apply route daily. 06/03/17  Yes WiTimmothy EulerBrTanzania, PA-C  celecoxib (CELEBREX) 100 MG capsule 1 po bid prn 07/24/19  Yes Dean, GrTonna CornerMD  esomeprazole (NEXIUM) 40 MG packet Take 40 mg by mouth daily before breakfast. 06/27/19  Yes GeGatha MayerMD  ezetimibe (ZETIA) 10 MG tablet Take 1 tablet (10 mg total) by mouth daily.  05/16/19  Yes [name], MD  glipiZIDE (GLUCOTROL) 10 MG tablet Take 1 tablet (10 mg total) by mouth 2 (two) times daily before a meal. 05/16/19  Yes [name], MD  ibuprofen (ADVIL) 600 MG tablet Take 1 tablet (600 mg total) by mouth every 6 (six) hours as needed for up to 30 doses. 08/02/19  Yes Trifan, Carola Rhine, MD  methocarbamol (ROBAXIN) 500 MG tablet Take 1 tablet (500 mg total) by mouth every 8 (eight) hours as needed for muscle spasms. 07/03/19  Yes [name], MD  metoprolol succinate (TOPROL-XL) 25 MG 24 hr tablet Take 1 tablet (25 mg total) by mouth daily. 07/30/19  Yes [name], MD  nortriptyline (PAMELOR) 25 MG  capsule Take 1 capsule (25 mg total) by mouth at bedtime. 08/12/19  Yes Maximiano Coss, NP  pioglitazone (ACTOS) 30 MG tablet Take 1 tablet (30 mg total) by mouth daily. 07/30/19  Yes [name], MD  predniSONE (DELTASONE) 20 MG tablet Take 1 tablet (20 mg total) by mouth daily with breakfast. 08/12/19  Yes Maximiano Coss, NP  rosuvastatin (CRESTOR) 40 MG tablet Take 1 tablet (40 mg total) by mouth daily. 07/30/19  Yes [name], MD  Semaglutide 3 MG TABS Take 3 mg by mouth daily. 08/10/19  Yes [name], MD  tobramycin-dexamethasone [facility]) ophthalmic solution Place 1 drop into the left eye 4 (four) times daily. 05/28/19  Yes [provider]  triamterene-hydrochlorothiazide (MAXZIDE-25) 37.5-25 MG tablet Take 1 tablet by mouth daily. 04/05/19  Yes Posey Boyer, MD    Past Medical History:  Diagnosis Date  . Allergy   . Asthma   . Bronchitis   . Cataract   . Diverticulosis   . DM2 (diabetes mellitus, type 2) ([place])   . GERD (gastroesophageal reflux disease)   . Heart murmur   . Hyperplastic colon polyp   . Hypertension     Past Surgical History:  Procedure Laterality Date  . CESAREAN SECTION    . COLONOSCOPY    . ESOPHAGOGASTRODUODENOSCOPY    . TONSILLECTOMY      Social History   Tobacco Use  . Smoking status: Never Smoker  . Smokeless tobacco: Never Used  Substance Use Topics  . Alcohol use: No    Family History  Problem Relation Age of Onset  . Cancer Mother        ovarian cancer  . Heart disease Mother   . Liver disease Mother   . Hyperlipidemia Mother   . Stroke Mother   . Cancer Father        lung cancer  . Cancer Sister   . Hyperlipidemia Sister   . Cancer Sister   . Hyperlipidemia Sister   . Diabetes Maternal Aunt   . Colon cancer Maternal Aunt        x2 aunts  . Colon cancer Maternal Uncle        2 uncles  . Colon cancer Paternal Grandfather   . Esophageal cancer Neg Hx   . Rectal cancer Neg Hx   . Stomach cancer Neg Hx      Review of Systems  Constitutional: Negative for chills and fever.  Respiratory: Negative for cough and shortness of breath.   Cardiovascular: Negative for chest pain, palpitations and leg swelling.  Gastrointestinal: Negative for abdominal pain, nausea and vomiting.     OBJECTIVE:  Today's Vitals   10/31/19 0901  BP: 135/80  Pulse: 74  Temp: (!) 97.3  F (36.3 C)  SpO2: 100%  Weight: 149 lb 9.6 oz (67.9 kg)  Height: 5' 4"  (1.626 m)   Body mass index is 25.68 kg/m.   Physical Exam Vitals and nursing note reviewed.  Constitutional:      Appearance: She is well-developed.  HENT:     Head: Normocephalic and atraumatic.     Mouth/Throat:     Pharynx: No oropharyngeal exudate.  Eyes:     General: No scleral icterus.    Extraocular Movements: Extraocular movements intact.     Conjunctiva/sclera: Conjunctivae normal.     Pupils: Pupils are equal, round, and reactive to light.  Cardiovascular:     Rate and Rhythm: Normal rate and regular rhythm.     Heart sounds: Normal heart sounds. No murmur heard.  No friction rub. No gallop.   Pulmonary:     Effort: Pulmonary effort is normal.     Breath sounds: Normal breath sounds. No wheezing, rhonchi or rales.  Musculoskeletal:     Cervical back: Neck supple.     Right lower leg: No edema.     Left lower leg: No edema.  Skin:    General: Skin is warm and dry.  Neurological:     Mental Status: She is alert and oriented to person, place, and time.     Results for orders placed or performed in visit on 10/31/19 (from the past 24 hour(s))  POCT glycosylated hemoglobin (Hb A1C)     Status: Abnormal   Collection Time: 10/31/19  9:29 AM  Result Value Ref Range   Hemoglobin A1C 8.6 (A) 4.0 - 5.6 %   HbA1c POC (<> result, manual entry)     HbA1c, POC (prediabetic range)     HbA1c, POC (controlled diabetic range)      No results found.   ASSESSMENT and PLAN  1. Type 2 diabetes mellitus without complication, without  long-term current use of insulin (HCC) Uncontrolled, increased reybelsus to 91m daily. Cont LFM - Lipid panel - POCT glycosylated hemoglobin (Hb A1C)  2. Essential hypertension Controlled. Continue current regime.  - Lipid panel - amLODipine (NORVASC) 10 MG tablet; Take 1 tablet (10 mg total) by mouth daily. - triamterene-hydrochlorothiazide (MAXZIDE-25) 37.5-25 MG tablet; Take 1 tablet by mouth daily.  3. Mixed hyperlipidemia Checking labs today, medications will be adjusted as needed.  - Lipid panel - Comprehensive metabolic panel - rosuvastatin (CRESTOR) 40 MG tablet; Take 1 tablet (40 mg total) by mouth daily.  4. Chronic tension-type headache, not intractable Controlled. Continue current regime.  - nortriptyline (PAMELOR) 25 MG capsule; Take 1 capsule (25 mg total) by mouth at bedtime.  5. Gastroesophageal reflux disease without esophagitis Controlled. Continue current regime.   6. Cervical radiculopathy Seeing ortho. Starting gabapentin at bedtime, reviewed r/se/b  7. Need for prophylactic vaccination and inoculation against influenza - Flu Vaccine QUAD High Dose(Fluad)  Other orders - glipiZIDE (GLUCOTROL) 10 MG tablet; Take 1 tablet (10 mg total) by mouth daily before breakfast. - gabapentin (NEURONTIN) 300 MG capsule; Take 1-2 capsules (300-600 mg total) by mouth at bedtime. - Semaglutide 7 MG TABS; Take 7 mg by mouth daily. - esomeprazole (NEXIUM) 40 MG packet; Take 40 mg by mouth daily before breakfast. - ezetimibe (ZETIA) 10 MG tablet; Take 1 tablet (10 mg total) by mouth daily. - metoprolol succinate (TOPROL-XL) 25 MG 24 hr tablet; Take 1 tablet (25 mg total) by mouth daily. - pioglitazone (ACTOS) 30 MG tablet; Take 1 tablet (30 mg total) by mouth  daily.  Return in about 3 months (around 01/30/2020).    [name], MD [facility] [place], [place] [address] Ph.  [phone] Fax [phone]

## 2019-10-31 NOTE — Patient Instructions (Signed)
° ° ° °  If you have lab work done today you will be contacted with your lab results within the next 2 weeks.  If you have not heard from us then please contact us. The fastest way to get your results is to register for My Chart. ° ° °IF you received an x-ray today, you will receive an invoice from [facility]. Please contact [facility] at [phone] with questions or concerns regarding your invoice.  ° °IF you received labwork today, you will receive an invoice from [facility]. Please contact [facility] at [phone] with questions or concerns regarding your invoice.  ° °Our billing staff will not be able to assist you with questions regarding bills from these companies. ° °You will be contacted with the lab results as soon as they are available. The fastest way to get your results is to activate your My Chart account. Instructions are located on the last page of this paperwork. If you have not heard from us regarding the results in 2 weeks, please contact this office. °  ° ° ° °

## 2019-11-01 LAB — LIPID PANEL
Chol/HDL Ratio: 3.5 ratio (ref 0.0–4.4)
Cholesterol, Total: 154 mg/dL (ref 100–199)
HDL: 44 mg/dL (ref >39)
LDL Chol Calc (NIH): 81 mg/dL (ref 0–99)
Triglycerides: 169 mg/dL — ABNORMAL HIGH (ref 0–149)
VLDL Cholesterol Cal: 29 mg/dL (ref 5–40)

## 2019-11-06 ENCOUNTER — Encounter: Payer: Self-pay | Admitting: Orthopedic Surgery

## 2019-11-06 ENCOUNTER — Ambulatory Visit: Payer: PPO | Admitting: Orthopedic Surgery

## 2019-11-06 ENCOUNTER — Other Ambulatory Visit: Payer: Self-pay | Admitting: Orthopedic Surgery

## 2019-11-06 DIAGNOSIS — G5601 Carpal tunnel syndrome, right upper limb: Secondary | ICD-10-CM

## 2019-11-06 MED ORDER — ACETAMINOPHEN-CODEINE #3 300-30 MG PO TABS: 1.0000 | ORAL_TABLET | Freq: Three times a day (TID) | ORAL | 0 refills | Status: DC | PRN

## 2019-11-06 NOTE — Telephone Encounter (Signed)
Patient wasn't  overlooked  but actually had to leave before I could see her.  I was running late.  Okay to call in tramadol before appointment on the 21st.

## 2019-11-06 NOTE — Progress Notes (Signed)
   Post-Op Visit Note   Patient: Erin Benitez           Date of Birth: [birth year]           MRN: [id number] Visit Date: 11/06/2019 PCP: [name], MD   Assessment & Plan:  Chief Complaint: No chief complaint on file.  Visit Diagnoses:  1. Carpal tunnel syndrome, right upper limb     Plan: Logyn left without being seen today.  I did call and leave her message on her phone about moderate carpal tunnel syndrome on the right which is likely contributing to release some if not most of her arm symptoms.  Neck MRI unremarkable in terms of operative cervical spine pathology.  We did call in Tylenol 3 because she is allergic to tramadol.  Come back on the 21st and we can discuss operative treatment of the carpal tunnel syndrome on the right.  Follow-Up Instructions: No follow-ups on file.   Orders:  No orders of the defined types were placed in this encounter.  No orders of the defined types were placed in this encounter.   Imaging: No results found.  PMFS History: Patient Active Problem List   Diagnosis Date Noted  . Type 2 diabetes mellitus without complication, without long-term current use of insulin ([place]) 09/06/2017  . Diverticulosis of colon without hemorrhage 03/09/2015  . Atypical chest pain 03/09/2015  . Hyperlipidemia 03/09/2015  . Gastroesophageal reflux disease without esophagitis 03/09/2015  . HTN (hypertension) 04/01/2013  . Asthma 04/01/2013   Past Medical History:  Diagnosis Date  . Allergy   . Asthma   . Bronchitis   . Cataract   . Diverticulosis   . DM2 (diabetes mellitus, type 2) ([place])   . GERD (gastroesophageal reflux disease)   . Heart murmur   . Hyperplastic colon polyp   . Hypertension     Family History  Problem Relation Age of Onset  . Cancer Mother        ovarian cancer  . Heart disease Mother   . Liver disease Mother   . Hyperlipidemia Mother   . Stroke Mother   . Cancer Father        lung cancer  . Cancer Sister   .  Hyperlipidemia Sister   . Cancer Sister   . Hyperlipidemia Sister   . Diabetes Maternal Aunt   . Colon cancer Maternal Aunt        x2 aunts  . Colon cancer Maternal Uncle        2 uncles  . Colon cancer Paternal Grandfather   . Esophageal cancer Neg Hx   . Rectal cancer Neg Hx   . Stomach cancer Neg Hx     Past Surgical History:  Procedure Laterality Date  . CESAREAN SECTION    . COLONOSCOPY    . ESOPHAGOGASTRODUODENOSCOPY    . TONSILLECTOMY     Social History   Occupational History  . Occupation: Educator  Tobacco Use  . Smoking status: Never Smoker  . Smokeless tobacco: Never Used  Vaping Use  . Vaping Use: Never used  Substance and Sexual Activity  . Alcohol use: No  . Drug use: No  . Sexual activity: Yes

## 2019-11-21 ENCOUNTER — Other Ambulatory Visit: Payer: Self-pay

## 2019-11-21 ENCOUNTER — Encounter: Payer: Self-pay | Admitting: Orthopedic Surgery

## 2019-11-21 ENCOUNTER — Ambulatory Visit (INDEPENDENT_AMBULATORY_CARE_PROVIDER_SITE_OTHER): Payer: PPO | Admitting: Orthopedic Surgery

## 2019-11-21 DIAGNOSIS — M7542 Impingement syndrome of left shoulder: Secondary | ICD-10-CM | POA: Diagnosis not present

## 2019-11-21 DIAGNOSIS — G5603 Carpal tunnel syndrome, bilateral upper limbs: Secondary | ICD-10-CM

## 2019-11-21 NOTE — Telephone Encounter (Signed)
ok 

## 2019-11-23 ENCOUNTER — Encounter: Payer: Self-pay | Admitting: Orthopedic Surgery
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
## 2019-11-24 DIAGNOSIS — M7542 Impingement syndrome of left shoulder: Secondary | ICD-10-CM | POA: Diagnosis not present

## 2019-11-24 DIAGNOSIS — G5603 Carpal tunnel syndrome, bilateral upper limbs: Secondary | ICD-10-CM | POA: Diagnosis not present

## 2019-11-24 MED ORDER — BUPIVACAINE HCL 0.5 % IJ SOLN
9.0000 mL | INTRAMUSCULAR | Status: AC | PRN
  Administered 2019-11-24: 9 mL via INTRA_ARTICULAR

## 2019-11-24 MED ORDER — METHYLPREDNISOLONE ACETATE 40 MG/ML IJ SUSP
40.0000 mg | INTRAMUSCULAR | Status: AC | PRN
Start: 2019-11-24 — End: 2019-11-24
  Administered 2019-11-24: 40 mg via INTRA_ARTICULAR

## 2019-11-24 MED ORDER — LIDOCAINE HCL 1 % IJ SOLN
5.0000 mL | INTRAMUSCULAR | Status: AC | PRN
  Administered 2019-11-24: 5 mL

## 2019-12-11 ENCOUNTER — Ambulatory Visit: Payer: PPO | Admitting: Orthopedic Surgery

## 2019-12-11 ENCOUNTER — Other Ambulatory Visit: Payer: Self-pay

## 2019-12-11 DIAGNOSIS — G5603 Carpal tunnel syndrome, bilateral upper limbs: Secondary | ICD-10-CM

## 2019-12-11 DIAGNOSIS — M7541 Impingement syndrome of right shoulder: Secondary | ICD-10-CM

## 2019-12-15 ENCOUNTER — Encounter: Payer: Self-pay | Admitting: Orthopedic Surgery
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
## 2019-12-18 DIAGNOSIS — M7541 Impingement syndrome of right shoulder: Secondary | ICD-10-CM

## 2019-12-18 DIAGNOSIS — G5603 Carpal tunnel syndrome, bilateral upper limbs: Secondary | ICD-10-CM | POA: Diagnosis not present

## 2019-12-18 MED ORDER — LIDOCAINE HCL 1 % IJ SOLN
5.0000 mL | INTRAMUSCULAR | Status: AC | PRN
  Administered 2019-12-18: 5 mL

## 2019-12-18 MED ORDER — METHYLPREDNISOLONE ACETATE 40 MG/ML IJ SUSP
40.0000 mg | INTRAMUSCULAR | Status: AC | PRN
  Administered 2019-12-18: 40 mg via INTRA_ARTICULAR

## 2019-12-18 MED ORDER — BUPIVACAINE HCL 0.5 % IJ SOLN
9.0000 mL | INTRAMUSCULAR | Status: AC | PRN
  Administered 2019-12-18: 9 mL via INTRA_ARTICULAR

## 2020-01-16 DIAGNOSIS — H16142 Punctate keratitis, left eye: Secondary | ICD-10-CM | POA: Diagnosis not present

## 2020-01-16 DIAGNOSIS — H40013 Open angle with borderline findings, low risk, bilateral: Secondary | ICD-10-CM | POA: Diagnosis not present

## 2020-01-30 ENCOUNTER — Ambulatory Visit: Payer: PPO | Admitting: Surgical

## 2020-01-30 ENCOUNTER — Ambulatory Visit (INDEPENDENT_AMBULATORY_CARE_PROVIDER_SITE_OTHER): Payer: PPO

## 2020-01-30 DIAGNOSIS — M7502 Adhesive capsulitis of left shoulder: Secondary | ICD-10-CM

## 2020-01-30 DIAGNOSIS — M25512 Pain in left shoulder: Secondary | ICD-10-CM | POA: Diagnosis not present

## 2020-02-02 ENCOUNTER — Encounter: Payer: Self-pay | Admitting: Surgical

## 2020-02-02 DIAGNOSIS — M7502 Adhesive capsulitis of left shoulder: Secondary | ICD-10-CM

## 2020-02-02 MED ORDER — METHYLPREDNISOLONE ACETATE 40 MG/ML IJ SUSP
40.0000 mg | INTRAMUSCULAR | Status: AC | PRN
  Administered 2020-02-02: 40 mg via INTRA_ARTICULAR

## 2020-02-02 MED ORDER — BUPIVACAINE HCL 0.5 % IJ SOLN
9.0000 mL | INTRAMUSCULAR | Status: AC | PRN
Start: 2020-02-02 — End: 2020-02-02
  Administered 2020-02-02: 9 mL via INTRA_ARTICULAR

## 2020-02-02 MED ORDER — LIDOCAINE HCL 1 % IJ SOLN
5.0000 mL | INTRAMUSCULAR | Status: AC | PRN
  Administered 2020-02-02: 5 mL

## 2020-02-02 NOTE — Progress Notes (Signed)
Office Visit Note   Patient: Erin Benitez           Date of Birth: 27-Jul-1950           MRN: [id number] Visit Date: 01/30/2020 Requested by: Jacelyn Pi, Lilia Argue, MD [address],  [place] [address] PCP: Jacelyn Pi, Lilia Argue, MD  Subjective: Chief Complaint  Patient presents with  . Shoulder Pain    HPI: Erin Benitez is a 70 y.o. female who presents to the office complaining of left shoulder pain and bilateral hand numbness/tingling.  She has history of diagnosed carpal tunnel syndrome.  She has just been scheduled for left carpal tunnel release.  She notes continued left shoulder pain.  She had a previous subacromial injection that provided fairly good relief on 11/21/2019.  She notes that she has had increased pain in the last 2 weeks.  She is not using her shoulder much due to the pain.  She notes objective weakness of the left shoulder and notes a popping sensation with passive range of motion and active range of motion.  Pain wakes her up at night.  She does have a history of diabetes but denies any history of thyroid disorders.  She takes gabapentin for relief of pain, primarily for her hands..                ROS: All systems reviewed are negative as they relate to the chief complaint within the history of present illness.  Patient denies fevers or chills.  Assessment & Plan: Visit Diagnoses:  1. Adhesive capsulitis of left shoulder   2. Left shoulder pain, unspecified chronicity     Plan: Patient is a 70 year old female who presents complaining of left shoulder pain.  She had decent relief with subacromial injection into the left shoulder back in October.  She request another injection today.  No radiographs recently of the left shoulder on file so new radiographs were taken today which were negative for any acute changes or any significant degeneration.  She does have slightly reduced passive range of motion of the left shoulder compared with the right shoulder  as well as a history of diabetes.  Discussed options available to patient.  With return of pain I recommended MRI scan to evaluate for any rotator cuff pathology versus frozen shoulder.  However she wants to hold off on MRI scan for now.  With decreased passive range of motion of the left shoulder compared with the right, impression at this time is left shoulder adhesive capsulitis.  Administered left glenohumeral injection.  Patient tolerated the procedure well.  Plan to follow-up after carpal tunnel release for evaluation of carpal tunnel as well as evaluation of her shoulder pain and range of motion.    Follow-Up Instructions: No follow-ups on file.   Orders:  Orders Placed This Encounter  Procedures  . XR Shoulder Left   No orders of the defined types were placed in this encounter.     Procedures: Large Joint Inj: L glenohumeral on 02/02/2020 12:08 PM Indications: diagnostic evaluation and pain Details: 18 G 1.5 in needle, posterior approach  Arthrogram: No  Medications: 9 mL bupivacaine 0.5 %; 40 mg methylPREDNISolone acetate 40 MG/ML; 5 mL lidocaine 1 % Outcome: tolerated well, no immediate complications Procedure, treatment alternatives, risks and benefits explained, specific risks discussed. Consent was given by the patient. Immediately prior to procedure a time out was called to verify the correct patient, procedure, equipment, support staff and site/side  marked as required. Patient was prepped and draped in the usual sterile fashion.       Clinical Data: No additional findings.  Objective: Vital Signs: There were no vitals taken for this visit.  Physical Exam:  Constitutional: Patient appears well-developed HEENT:  Head: Normocephalic Eyes:EOM are normal Neck: Normal range of motion Cardiovascular: Normal rate Pulmonary/chest: Effort normal Neurologic: Patient is alert Skin: Skin is warm Psychiatric: Patient has normal mood and affect  Ortho Exam: Ortho exam  demonstrates right shoulder with 50 degrees external rotation, 105 degrees abduction, 160 degrees forward flexion.  This is compared with left shoulder with 35 degrees external rotation, 85 degrees abduction, 140 degrees forward flexion.  Excellent rotator cuff strength of the right shoulder.  5 -/5 supraspinatus strength of the left shoulder with 5/5 motor strength of the infraspinatus/subscapularis.  Internal rotation of the shoulders behind the back is equivalent between the right and left shoulders though it does take her a couple seconds longer to get her left hand in the same position as her right hand.  Specialty Comments:  No specialty comments available.  Imaging: No results found.   PMFS History: Patient Active Problem List   Diagnosis Date Noted  . Type 2 diabetes mellitus without complication, without long-term current use of insulin ([facility]) 09/06/2017  . Diverticulosis of colon without hemorrhage 03/09/2015  . Atypical chest pain 03/09/2015  . Hyperlipidemia 03/09/2015  . Gastroesophageal reflux disease without esophagitis 03/09/2015  . HTN (hypertension) 04/01/2013  . Asthma 04/01/2013   Past Medical History:  Diagnosis Date  . Allergy   . Asthma   . Bronchitis   . Cataract   . Diverticulosis   . DM2 (diabetes mellitus, type 2) ([facility])   . GERD (gastroesophageal reflux disease)   . Heart murmur   . Hyperplastic colon polyp   . Hypertension     Family History  Problem Relation Age of Onset  . Cancer Mother        ovarian cancer  . Heart disease Mother   . Liver disease Mother   . Hyperlipidemia Mother   . Stroke Mother   . Cancer Father        lung cancer  . Cancer Sister   . Hyperlipidemia Sister   . Cancer Sister   . Hyperlipidemia Sister   . Diabetes Maternal Aunt   . Colon cancer Maternal Aunt        x2 aunts  . Colon cancer Maternal Uncle        2 uncles  . Colon cancer Paternal Grandfather   . Esophageal cancer Neg Hx   . Rectal cancer Neg Hx   .  Stomach cancer Neg Hx     Past Surgical History:  Procedure Laterality Date  . CESAREAN SECTION    . COLONOSCOPY    . ESOPHAGOGASTRODUODENOSCOPY    . TONSILLECTOMY     Social History   Occupational History  . Occupation: Educator  Tobacco Use  . Smoking status: Never Smoker  . Smokeless tobacco: Never Used  Vaping Use  . Vaping Use: Never used  Substance and Sexual Activity  . Alcohol use: No  . Drug use: No  . Sexual activity: Yes

## 2020-02-03 ENCOUNTER — Other Ambulatory Visit (HOSPITAL_COMMUNITY): Payer: PPO

## 2020-02-15 NOTE — Progress Notes (Signed)
Patient was scheduled for pre-procedure covid test on 1/17,  Looks like surgery is moved to 3/17.  Called left voicemail for patient that pre-procedure covid test is scheduled on 3/14 now come between 8Am-3Pm

## 2020-02-17 ENCOUNTER — Other Ambulatory Visit (HOSPITAL_COMMUNITY): Payer: PPO

## 2020-02-17 ENCOUNTER — Inpatient Hospital Stay: Payer: PPO | Admitting: Orthopedic Surgery

## 2020-03-02 ENCOUNTER — Inpatient Hospital Stay: Payer: PPO | Admitting: Orthopedic Surgery

## 2020-03-23 ENCOUNTER — Encounter: Payer: Self-pay | Admitting: Orthopedic Surgery

## 2020-03-23 DIAGNOSIS — M7502 Adhesive capsulitis of left shoulder: Secondary | ICD-10-CM

## 2020-03-23 DIAGNOSIS — M25512 Pain in left shoulder: Secondary | ICD-10-CM

## 2020-03-24 ENCOUNTER — Telehealth: Payer: Self-pay | Admitting: Orthopedic Surgery

## 2020-03-24 NOTE — Telephone Encounter (Signed)
Patient calling to cancel left carpal tunnel release scheduled with Dr. Marlou Sa on 04-16-20 at [facility]. She states her shoulder is still bothering her and is scheduled for an MRI on March 16th.  She wants to get the pain from the shoulder under control first and then deal with the carpal tunnel issue     Pt's cb  [phone]

## 2020-03-24 NOTE — Telephone Encounter (Signed)
Ok thx.

## 2020-03-24 NOTE — Telephone Encounter (Signed)
See below

## 2020-04-06 ENCOUNTER — Telehealth: Payer: Self-pay | Admitting: Orthopedic Surgery

## 2020-04-06 NOTE — Telephone Encounter (Signed)
Called patient left voice message to return call to schedule an appointment for MRI review with Dr. Marlou Sa     MRI scheduled for 04/13/2020

## 2020-04-13 ENCOUNTER — Inpatient Hospital Stay (HOSPITAL_COMMUNITY)
Admission: RE | Admit: 2020-04-13 | Discharge: 2020-04-13 | Disposition: A | Discharge status: Home / Self Care | Payer: PPO | Source: Ambulatory Visit

## 2020-04-15 ENCOUNTER — Ambulatory Visit
Admission: RE | Admit: 2020-04-15 | Discharge: 2020-04-15 | Disposition: A | Discharge status: Home / Self Care | Payer: PPO | Source: Ambulatory Visit | Attending: Orthopedic Surgery | Admitting: Orthopedic Surgery

## 2020-04-15 ENCOUNTER — Other Ambulatory Visit: Payer: Self-pay

## 2020-04-15 DIAGNOSIS — M25512 Pain in left shoulder: Secondary | ICD-10-CM

## 2020-04-15 DIAGNOSIS — M7502 Adhesive capsulitis of left shoulder: Secondary | ICD-10-CM

## 2020-04-15 DIAGNOSIS — M25612 Stiffness of left shoulder, not elsewhere classified: Secondary | ICD-10-CM | POA: Diagnosis not present

## 2020-04-15 DIAGNOSIS — M75112 Incomplete rotator cuff tear or rupture of left shoulder, not specified as traumatic: Secondary | ICD-10-CM | POA: Diagnosis not present

## 2020-04-15 MED ORDER — IOPAMIDOL (ISOVUE-M 200) INJECTION 41%
13.0000 mL | Freq: Once | INTRAMUSCULAR | Status: AC
  Administered 2020-04-15: 13 mL via INTRA_ARTICULAR

## 2020-04-16 ENCOUNTER — Ambulatory Visit (HOSPITAL_BASED_OUTPATIENT_CLINIC_OR_DEPARTMENT_OTHER): Admission: RE | Admit: 2020-04-16 | Payer: PPO | Source: Home / Self Care | Admitting: Orthopedic Surgery

## 2020-04-16 ENCOUNTER — Encounter (HOSPITAL_BASED_OUTPATIENT_CLINIC_OR_DEPARTMENT_OTHER): Admission: RE | Payer: Self-pay | Source: Home / Self Care

## 2020-04-16 SURGERY — CARPAL TUNNEL RELEASE
Anesthesia: Regional | Laterality: Left

## 2020-04-20 ENCOUNTER — Other Ambulatory Visit: Payer: Self-pay

## 2020-04-20 ENCOUNTER — Ambulatory Visit: Payer: PPO | Admitting: Orthopedic Surgery

## 2020-04-20 DIAGNOSIS — M5412 Radiculopathy, cervical region: Secondary | ICD-10-CM | POA: Diagnosis not present

## 2020-04-20 DIAGNOSIS — M75102 Unspecified rotator cuff tear or rupture of left shoulder, not specified as traumatic: Secondary | ICD-10-CM | POA: Diagnosis not present

## 2020-04-21 ENCOUNTER — Encounter: Payer: Self-pay | Admitting: Orthopedic Surgery

## 2020-04-22 ENCOUNTER — Encounter: Payer: Self-pay | Admitting: Orthopedic Surgery

## 2020-04-22 NOTE — Progress Notes (Signed)
Office Visit Note   Patient: Erin Benitez           Date of Birth: March 31, 1950           MRN: [id number] Visit Date: 04/20/2020 Requested by: Jacelyn Pi, Lilia Argue, MD [address] [place],  [place] [address] PCP: Jacelyn Pi, Lilia Argue, MD  Subjective: Chief Complaint  Patient presents with  . Other    Scan review    HPI: Erin Benitez is a 70 y.o. female who presents to the office complaining of left shoulder pain.  Patient is here to discuss MRI of the left shoulder.  She does report mid humerus pain in the left shoulder that radiates to her forearm.  She has had increasing radicular symptoms since last office visit with numbness and tingling of the entire arm as well as associated neck pain on the left side.  She has left-sided shoulder blade pain.  She has had to cancel carpal tunnel release that was scheduled due to the severe shoulder pain she is experiencing.  Pain wakes her up most nights.  She has had subacromial injections in the past that provided about a month of relief.  Previous glenohumeral injection provided no relief.  She has history of cervical spine MRI scan from July 2021.  The cervical spine MRI showed canal narrowing at C4-C5 without visible cord compression and mild bilateral foraminal narrowing at multiple levels..                ROS: All systems reviewed are negative as they relate to the chief complaint within the history of present illness.  Patient denies fevers or chills.  Assessment & Plan: Visit Diagnoses:  1. Radiculopathy, cervical region   2. Nontraumatic tear of left supraspinatus tendon     Plan: Patient is a 70 year old female presents complaint of left shoulder pain.  She is here to review MRI of the left shoulder.  MRI revealed moderate tendinosis of the supraspinatus with a insertional interstitial tear and a partial-thickness bursal surface tear.  No other significant findings.  She is very frustrated by her continued left shoulder pain.   On exam and by history it seems that a lot of her pain is related to cervical spine radiculopathy.  No history of prior ESI's according to her.  She has had prior cervical spine MRI in July 2021 that shows spinal canal narrowing at C4-C5.  Plan to administer subacromial injection to the left shoulder today as this has provided good relief in the past.  Hopefully this will help with her pain in the meantime as we work to get her in with Dr. Ernestina Patches for cervical spine ESI.  She will follow up 6 weeks after this injection to see if her pain has improved and which injection was more beneficial for her.  Patient agreed with this plan.  She tolerated the left shoulder subacromial injection well.  Follow-Up Instructions: No follow-ups on file.   Orders:  Orders Placed This Encounter  Procedures  . Ambulatory referral to Physical Medicine Rehab   No orders of the defined types were placed in this encounter.     Procedures: Large Joint Inj: L subacromial bursa on 04/20/2020 1:00 PM Indications: diagnostic evaluation and pain Details: 18 G 1.5 in needle, posterior approach  Arthrogram: No  Medications: 9 mL bupivacaine 0.5 %; 40 mg methylPREDNISolone acetate 40 MG/ML; 5 mL lidocaine 1 % Outcome: tolerated well, no immediate complications Procedure, treatment alternatives, risks and benefits  explained, specific risks discussed. Consent was given by the patient. Immediately prior to procedure a time out was called to verify the correct patient, procedure, equipment, support staff and site/side marked as required. Patient was prepped and draped in the usual sterile fashion.       Clinical Data: No additional findings.  Objective: Vital Signs: There were no vitals taken for this visit.  Physical Exam:  Constitutional: Patient appears well-developed HEENT:  Head: Normocephalic Eyes:EOM are normal Neck: Normal range of motion Cardiovascular: Normal rate Pulmonary/chest: Effort  normal Neurologic: Patient is alert Skin: Skin is warm Psychiatric: Patient has normal mood and affect  Ortho Exam: Ortho exam demonstrates left shoulder with increased pain with passive motion.  Range of motion and rotator cuff strength is difficult to assess due to patient's pain.  She has tenderness throughout the axial cervical spine as well as pain with motion of the left shoulder that shoots all the way down to her fingertips.  Positive Spurling sign down the left arm.  Specialty Comments:  No specialty comments available.  Imaging: No results found.   PMFS History: Patient Active Problem List   Diagnosis Date Noted  . Type 2 diabetes mellitus without complication, without long-term current use of insulin ([place]) 09/06/2017  . Diverticulosis of colon without hemorrhage 03/09/2015  . Atypical chest pain 03/09/2015  . Hyperlipidemia 03/09/2015  . Gastroesophageal reflux disease without esophagitis 03/09/2015  . HTN (hypertension) 04/01/2013  . Asthma 04/01/2013   Past Medical History:  Diagnosis Date  . Allergy   . Asthma   . Bronchitis   . Cataract   . Diverticulosis   . DM2 (diabetes mellitus, type 2) ([place])   . GERD (gastroesophageal reflux disease)   . Heart murmur   . Hyperplastic colon polyp   . Hypertension     Family History  Problem Relation Age of Onset  . Cancer Mother        ovarian cancer  . Heart disease Mother   . Liver disease Mother   . Hyperlipidemia Mother   . Stroke Mother   . Cancer Father        lung cancer  . Cancer Sister   . Hyperlipidemia Sister   . Cancer Sister   . Hyperlipidemia Sister   . Diabetes Maternal Aunt   . Colon cancer Maternal Aunt        x2 aunts  . Colon cancer Maternal Uncle        2 uncles  . Colon cancer Paternal Grandfather   . Esophageal cancer Neg Hx   . Rectal cancer Neg Hx   . Stomach cancer Neg Hx     Past Surgical History:  Procedure Laterality Date  . CESAREAN SECTION    . COLONOSCOPY    .  ESOPHAGOGASTRODUODENOSCOPY    . TONSILLECTOMY     Social History   Occupational History  . Occupation: Educator  Tobacco Use  . Smoking status: Never Smoker  . Smokeless tobacco: Never Used  Vaping Use  . Vaping Use: Never used  Substance and Sexual Activity  . Alcohol use: No  . Drug use: No  . Sexual activity: Yes

## 2020-04-23 DIAGNOSIS — H40013 Open angle with borderline findings, low risk, bilateral: Secondary | ICD-10-CM | POA: Diagnosis not present

## 2020-04-23 DIAGNOSIS — H538 Other visual disturbances: Secondary | ICD-10-CM | POA: Diagnosis not present

## 2020-04-23 DIAGNOSIS — H43811 Vitreous degeneration, right eye: Secondary | ICD-10-CM | POA: Diagnosis not present

## 2020-04-24 ENCOUNTER — Inpatient Hospital Stay: Payer: PPO | Admitting: Orthopedic Surgery

## 2020-04-26 MED ORDER — LIDOCAINE HCL 1 % IJ SOLN
5.0000 mL | INTRAMUSCULAR | Status: AC | PRN
  Administered 2020-04-20: 5 mL

## 2020-04-26 MED ORDER — METHYLPREDNISOLONE ACETATE 40 MG/ML IJ SUSP
40.0000 mg | INTRAMUSCULAR | Status: AC | PRN
Start: 2020-04-20 — End: 2020-04-20
  Administered 2020-04-20: 40 mg via INTRA_ARTICULAR

## 2020-04-26 MED ORDER — BUPIVACAINE HCL 0.5 % IJ SOLN
9.0000 mL | INTRAMUSCULAR | Status: AC | PRN
  Administered 2020-04-20: 9 mL via INTRA_ARTICULAR

## 2020-05-12 ENCOUNTER — Ambulatory Visit: Payer: Self-pay

## 2020-05-12 ENCOUNTER — Ambulatory Visit: Payer: PPO | Admitting: Physical Medicine and Rehabilitation

## 2020-05-12 ENCOUNTER — Encounter: Payer: Self-pay | Admitting: Physical Medicine and Rehabilitation

## 2020-05-12 ENCOUNTER — Other Ambulatory Visit: Payer: Self-pay

## 2020-05-12 VITALS — BP 119/76 | HR 88

## 2020-05-12 DIAGNOSIS — M5412 Radiculopathy, cervical region: Secondary | ICD-10-CM

## 2020-05-12 DIAGNOSIS — M4802 Spinal stenosis, cervical region: Secondary | ICD-10-CM

## 2020-05-12 MED ORDER — BETAMETHASONE SOD PHOS & ACET 6 (3-3) MG/ML IJ SUSP
12.0000 mg | Freq: Once | INTRAMUSCULAR | Status: AC
  Administered 2020-05-12: 12 mg

## 2020-05-12 NOTE — Progress Notes (Signed)
Erin Benitez - 70 y.o. female MRN [id number]  Date of birth: [birth year]  Office Visit Note: Visit Date: 05/12/2020 PCP: Jacelyn Pi, Lilia Argue, MD Referred by: Jacelyn Pi, Lilia Argue, *  Subjective: Chief Complaint  Patient presents with  . Neck - Pain  . Left Arm - Pain   HPI:  Erin Benitez is a 70 y.o. female who comes in today at the request of Dr. [name] for planned Left C7-T1 Cervical Interlaminar epidural steroid injection with fluoroscopic guidance.  The patient has failed conservative care including home exercise, medications, time and activity modification.  This injection will be diagnostic and hopefully therapeutic.  Please see requesting physician notes for further details and justification.  Prior cervical epidural injection in August of last year helped a great deal and lasted several months.  She is pretty happy with the results.  Her pain now is 8 out of 10 with no new findings.  She continues with home exercises and medication.   ROS Otherwise per HPI.  Assessment & Plan: Visit Diagnoses:    ICD-10-CM   1. Cervical radiculopathy  M54.12 XR C-ARM NO REPORT    Epidural Steroid injection    betamethasone acetate-betamethasone sodium phosphate (CELESTONE) injection 12 mg  2. Spinal stenosis of cervical region  M48.02     Plan: No additional findings.   Meds & Orders:  Meds ordered this encounter  Medications  . betamethasone acetate-betamethasone sodium phosphate (CELESTONE) injection 12 mg    Orders Placed This Encounter  Procedures  . XR C-ARM NO REPORT  . Epidural Steroid injection    Follow-up: Return in about 4 weeks (around 06/09/2020) for  [name], MD.   Procedures: No procedures performed  Cervical Epidural Steroid Injection - Interlaminar Approach with Fluoroscopic Guidance  Patient: Erin Benitez      Date of Birth: [birth year] MRN: [id number] PCP: Jacelyn Pi, Lilia Argue, MD      Visit Date: 05/12/2020   Universal Protocol:    Date/Time:  05/13/225:47 AM  Consent Given By: the patient  Position: PRONE  Additional Comments: Vital signs were monitored before and after the procedure. Patient was prepped and draped in the usual sterile fashion. The correct patient, procedure, and site was verified.   Injection Procedure Details:   Procedure diagnoses: Cervical radiculopathy [M54.12]    Meds Administered:  Meds ordered this encounter  Medications  . betamethasone acetate-betamethasone sodium phosphate (CELESTONE) injection 12 mg     Laterality: Left  Location/Site: C7-T1  Needle: 3.5 in., 20 ga. Tuohy  Needle Placement: Paramedian epidural space  Findings:  -Comments: Excellent flow of contrast into the epidural space.  Procedure Details: Using a paramedian approach from the side mentioned above, the region overlying the inferior lamina was localized under fluoroscopic visualization and the soft tissues overlying this structure were infiltrated with 4 ml. of 1% Lidocaine without Epinephrine. A # 20 gauge, Tuohy needle was inserted into the epidural space using a paramedian approach.  The epidural space was localized using loss of resistance along with contralateral oblique bi-planar fluoroscopic views.  After negative aspirate for air, blood, and CSF, a 2 ml. volume of Isovue-250 was injected into the epidural space and the flow of contrast was observed. Radiographs were obtained for documentation purposes.   The injectate was administered into the level noted above.  Additional Comments:  The patient tolerated the procedure well Dressing: 2 x 2 sterile gauze and Band-Aid    Post-procedure details: Patient was observed during the  procedure. Post-procedure instructions were reviewed.  Patient left the clinic in stable condition.     Clinical History: Neck pain radiating to both shoulders. Intermittent hand numbness. Symptoms began in March of this year.  EXAM: MRI CERVICAL SPINE WITHOUT  CONTRAST  TECHNIQUE: Multiplanar, multisequence MR imaging of the cervical spine was performed. No intravenous contrast was administered.  COMPARISON:  CT 08/02/2019  FINDINGS: Alignment: Normal  Vertebrae: No fracture or primary bone lesion.  Cord: No cord compression or primary cord lesion.  Posterior Fossa, vertebral arteries, paraspinal tissues: Normal  Disc levels:  Foramen magnum is widely patent.  C1-2 and C2-3 levels are normal.  C3-4: Mild bulging of the disc. Mild foraminal narrowing due to uncovertebral prominence. No likely compressive stenosis.  C4-5: Shallow disc protrusion effaces the ventral subarachnoid space but does not compress the cord. AP diameter of the canal in the midline is 7.9 mm. Mild bilateral foraminal narrowing, not grossly compressive.  C5-6: Bulging of the disc and uncovertebral prominence. No central canal stenosis. Bilateral bony foraminal narrowing right more than left. Some potential that this could affect the right C6 nerve.  C6-7: Bulging of the disc. No canal stenosis. Mild bilateral foraminal narrowing.  C7-T1: Minimal disc bulge. No facet arthropathy. No canal or foraminal stenosis.  Upper thoracic region is negative.  IMPRESSION: C3-4: Mild spondylosis. Mild bilateral foraminal narrowing, not likely compressive.  C4-5: Shallow disc protrusion. Canal narrowing with AP diameter of 7.9 mm, but without visible cord compression. Mild bilateral foraminal stenosis.  C5-6: Spondylosis with mild foraminal narrowing on the right. No definite neural compression, but the right C6 nerve could possibly be affected.  C6-7: Spondylosis with mild bilateral bony foraminal narrowing, not likely compressive.   Electronically Signed   By: Nelson Chimes M.D.   On: 08/31/2019 14:11     Objective:  VS:  HT:    WT:   BMI:     BP:119/76  HR:88bpm  TEMP: ( )  RESP:  Physical Exam Vitals and nursing note  reviewed.  Constitutional:      General: She is not in acute distress.    Appearance: Normal appearance. She is not ill-appearing.  HENT:     Head: Normocephalic and atraumatic.     Right Ear: External ear normal.     Left Ear: External ear normal.  Eyes:     Extraocular Movements: Extraocular movements intact.  Cardiovascular:     Rate and Rhythm: Normal rate.     Pulses: Normal pulses.  Musculoskeletal:     Cervical back: Tenderness present. No rigidity.     Right lower leg: No edema.     Left lower leg: No edema.     Comments: Patient has good strength in the upper extremities including 5 out of 5 strength in wrist extension long finger flexion and APB.  There is no atrophy of the hands intrinsically.  There is a negative Hoffmann's test.   Lymphadenopathy:     Cervical: No cervical adenopathy.  Skin:    Findings: No erythema, lesion or rash.  Neurological:     General: No focal deficit present.     Mental Status: She is alert and oriented to person, place, and time.     Sensory: No sensory deficit.     Motor: No weakness or abnormal muscle tone.     Coordination: Coordination normal.  Psychiatric:        Mood and Affect: Mood normal.        Behavior: Behavior  normal.      Imaging: No results found.

## 2020-05-12 NOTE — Patient Instructions (Signed)

## 2020-05-12 NOTE — Progress Notes (Signed)
Pt state neck pain the travels down her left arm. Pt state trying to open bottles and laying down makes the pain worse. Pt state she take over the counter pain meds to help ease her pain. Pt has hx of inj on 09/19/19 Pt state it helped and lasted a few months.  Numeric Pain Rating Scale and Functional Assessment Average Pain 8   In the last MONTH (on 0-10 scale) has pain interfered with the following?  1. General activity like being  able to carry out your everyday physical activities such as walking, climbing stairs, carrying groceries, or moving a chair?  Rating(8)   +Driver, -BT, -Dye Allergies.

## 2020-06-10 ENCOUNTER — Telehealth: Payer: Self-pay

## 2020-06-10 ENCOUNTER — Ambulatory Visit: Payer: PPO | Admitting: Orthopedic Surgery

## 2020-06-10 DIAGNOSIS — M5412 Radiculopathy, cervical region: Secondary | ICD-10-CM | POA: Diagnosis not present

## 2020-06-10 NOTE — Telephone Encounter (Signed)
Dr Marlou Sa saw patient this morning. He would like for patient to see Dr Lorin Mercy to discuss options for her neck. Please schedule for 30 min thanks.

## 2020-06-12 NOTE — Procedures (Signed)
Cervical Epidural Steroid Injection - Interlaminar Approach with Fluoroscopic Guidance  Patient: Erin Benitez      Date of Birth: 10-03-1950 MRN: [id number] PCP: Jacelyn Pi, Lilia Argue, MD      Visit Date: 05/12/2020   Universal Protocol:    Date/Time: 05/13/225:47 AM  Consent Given By: the patient  Position: PRONE  Additional Comments: Vital signs were monitored before and after the procedure. Patient was prepped and draped in the usual sterile fashion. The correct patient, procedure, and site was verified.   Injection Procedure Details:   Procedure diagnoses: Cervical radiculopathy [M54.12]    Meds Administered:  Meds ordered this encounter  Medications  . betamethasone acetate-betamethasone sodium phosphate (CELESTONE) injection 12 mg     Laterality: Left  Location/Site: C7-T1  Needle: 3.5 in., 20 ga. Tuohy  Needle Placement: Paramedian epidural space  Findings:  -Comments: Excellent flow of contrast into the epidural space.  Procedure Details: Using a paramedian approach from the side mentioned above, the region overlying the inferior lamina was localized under fluoroscopic visualization and the soft tissues overlying this structure were infiltrated with 4 ml. of 1% Lidocaine without Epinephrine. A # 20 gauge, Tuohy needle was inserted into the epidural space using a paramedian approach.  The epidural space was localized using loss of resistance along with contralateral oblique bi-planar fluoroscopic views.  After negative aspirate for air, blood, and CSF, a 2 ml. volume of Isovue-250 was injected into the epidural space and the flow of contrast was observed. Radiographs were obtained for documentation purposes.   The injectate was administered into the level noted above.  Additional Comments:  The patient tolerated the procedure well Dressing: 2 x 2 sterile gauze and Band-Aid    Post-procedure details: Patient was observed during the procedure. Post-procedure  instructions were reviewed.  Patient left the clinic in stable condition.

## 2020-06-13 ENCOUNTER — Encounter: Payer: Self-pay | Admitting: Orthopedic Surgery

## 2020-06-13 NOTE — Progress Notes (Signed)
Office Visit Note   Patient: Erin Benitez           Date of Birth: [birth year]           MRN: [id number] Visit Date: 06/10/2020 Requested by: Jacelyn Pi, Lilia Argue, MD [place] [place],  [place] [address] PCP: Jacelyn Pi, Lilia Argue, MD  Subjective: Chief Complaint  Patient presents with  . Neck - Pain    HPI: Erin Benitez is a 70 year old patient with neck pain.  She had cervical spine ESI with Dr. Ernestina Patches 4 weeks ago.  First shot helped more second shot was 4 weeks ago did not help as much.  Reports constant pain with bilateral arm pain left worse than right along with numbness and tingling.  She has left shoulder tendinitis this but no significant pathology.  She has known C5-6 spondylosis and C6-7 spondylosis with nerve root compression.  Pain does not wake her from sleep at night.  Symptoms not necessarily improved by placing her arms overhead.  Hurts her to drive and hurts to sleep.  Taking Tylenol for pain.  She works in Software engineer.  She is interested in surgical evaluation.              ROS: All systems reviewed are negative as they relate to the chief complaint within the history of present illness.  Patient denies  fevers or chills.   Assessment & Plan: Visit Diagnoses:  1. Radiculopathy, cervical region     Plan: Impression is cervical spondylosis with failure of conservative treatment.  Plan is referral to Dr. Inda Merlin for evaluation of her nerve root compression.  No weakness on exam today but I think she is having an amount of pain that could be considered intractable and surgical in nature.  Follow-Up Instructions: No follow-ups on file.   Orders:  No orders of the defined types were placed in this encounter.  No orders of the defined types were placed in this encounter.     Procedures: No procedures performed   Clinical Data: No additional findings.  Objective: Vital Signs: There were no vitals taken for this visit.  Physical Exam:   Constitutional:  Patient appears well-developed HEENT:  Head: Normocephalic Eyes:EOM are normal Neck: Normal range of motion Cardiovascular: Normal rate Pulmonary/chest: Effort normal Neurologic: Patient is alert Skin: Skin is warm Psychiatric: Patient has normal mood and affect    Ortho Exam: Ortho exam demonstrates pretty reasonable shoulder exam bilaterally with maintained passive range of motion and good rotator cuff strength.  No muscle atrophy in the arms.  Reflexes symmetric bilateral biceps and triceps.  Radial pulse intact bilaterally.  Range of motion of the neck flexion within 1 inch chin to chest extension is about 35 to 40 degrees rotation 45 degrees.  Specialty Comments:  No specialty comments available.  Imaging: No results found.   PMFS History: Patient Active Problem List   Diagnosis Date Noted  . Type 2 diabetes mellitus without complication, without long-term current use of insulin ([place]) 09/06/2017  . Diverticulosis of colon without hemorrhage 03/09/2015  . Atypical chest pain 03/09/2015  . Hyperlipidemia 03/09/2015  . Gastroesophageal reflux disease without esophagitis 03/09/2015  . HTN (hypertension) 04/01/2013  . Asthma 04/01/2013   Past Medical History:  Diagnosis Date  . Allergy   . Asthma   . Bronchitis   . Cataract   . Diverticulosis   . DM2 (diabetes mellitus, type 2) ([place])   . GERD (gastroesophageal reflux disease)   . Heart  murmur   . Hyperplastic colon polyp   . Hypertension     Family History  Problem Relation Age of Onset  . Cancer Mother        ovarian cancer  . Heart disease Mother   . Liver disease Mother   . Hyperlipidemia Mother   . Stroke Mother   . Cancer Father        lung cancer  . Cancer Sister   . Hyperlipidemia Sister   . Cancer Sister   . Hyperlipidemia Sister   . Diabetes Maternal Aunt   . Colon cancer Maternal Aunt        x2 aunts  . Colon cancer Maternal Uncle        2 uncles  . Colon cancer Paternal Grandfather   .  Esophageal cancer Neg Hx   . Rectal cancer Neg Hx   . Stomach cancer Neg Hx     Past Surgical History:  Procedure Laterality Date  . CESAREAN SECTION    . COLONOSCOPY    . ESOPHAGOGASTRODUODENOSCOPY    . TONSILLECTOMY     Social History   Occupational History  . Occupation: Educator  Tobacco Use  . Smoking status: Never Smoker  . Smokeless tobacco: Never Used  Vaping Use  . Vaping Use: Never used  Substance and Sexual Activity  . Alcohol use: No  . Drug use: No  . Sexual activity: Yes

## 2020-06-23 ENCOUNTER — Encounter (HOSPITAL_COMMUNITY): Payer: Self-pay

## 2020-06-23 ENCOUNTER — Ambulatory Visit (HOSPITAL_COMMUNITY)
Admission: EM | Admit: 2020-06-23 | Discharge: 2020-06-23 | Disposition: A | Discharge status: Home / Self Care | Payer: PPO | Attending: Family | Admitting: Family

## 2020-06-23 ENCOUNTER — Other Ambulatory Visit: Payer: Self-pay

## 2020-06-23 DIAGNOSIS — I10 Essential (primary) hypertension: Secondary | ICD-10-CM

## 2020-06-23 DIAGNOSIS — Z20822 Contact with and (suspected) exposure to covid-19: Secondary | ICD-10-CM

## 2020-06-23 DIAGNOSIS — U071 COVID-19: Secondary | ICD-10-CM

## 2020-06-23 DIAGNOSIS — R52 Pain, unspecified: Secondary | ICD-10-CM

## 2020-06-23 DIAGNOSIS — R059 Cough, unspecified: Secondary | ICD-10-CM

## 2020-06-23 MED ORDER — ALBUTEROL SULFATE HFA 108 (90 BASE) MCG/ACT IN AERS: 2.0000 | INHALATION_SPRAY | Freq: Four times a day (QID) | RESPIRATORY_TRACT | 0 refills | Status: DC | PRN

## 2020-06-23 MED ORDER — HYDROCOD POLST-CPM POLST ER 10-8 MG/5ML PO SUER
5.0000 mL | Freq: Two times a day (BID) | ORAL | 0 refills | Status: DC | PRN
Start: 2020-06-23 — End: 2020-10-07

## 2020-06-23 MED ORDER — MOLNUPIRAVIR EUA 200MG CAPSULE: 4.0000 | ORAL_CAPSULE | Freq: Two times a day (BID) | ORAL | 0 refills | Status: AC

## 2020-06-23 NOTE — Discharge Instructions (Addendum)
Recommend start Molnupiravir 800mg  (4 pills) twice a day for 5 days. May use Albuterol inhaler 2 puffs every 6 hours as needed for cough or wheezing. Continue to take Mucinex as directed. Continue to increase water and fluid intake to stay well hydrated and to help loosen up any mucus in the chest. Rest. Stay at home. May take Tussionex cough syrup 1 teaspoon every 12 hours as needed. Restart blood pressure medication today. If pain increases, any difficulty breathing occurs, or unable to keep down any fluids or food, go to the ER ASAP for evaluation. Otherwise, follow-up in 4 to 5 days for recheck.

## 2020-06-23 NOTE — ED Provider Notes (Signed)
[place]    CSN: [id number] Arrival date & time: 06/23/20  1029      History   Chief Complaint Chief Complaint  Patient presents with  . Covid +    HPI Erin Benitez is a 70 y.o. female.   70 year old female presents with fatigue, cough and body aches for the past 4 days. Also having low grade fever, chills, nausea, decreased appetite and mild diarrhea. Denies any nasal congestion or vomiting. Has noticed increase in chronic back and left arm pain over the past few days. Took home COVID 19 test 3 days ago and was negative but went to [facility] for send-out COVID testing on Sunday (2 days ago) and results are positive. Asked [facility] yesterday about treatments and they told her she needs an Rx from her Health care provider. Her family practice closed and she has an appointment with a new PCP but not for a few weeks. She has tried [employer] with no relief and Mucinex with some relief. Requests COVID 19 oral anti-viral treatment. 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 Currently on Norvasc, Toprol XL, Maxzide, Crestor, Zetia, Glipizide, Actos, Semaglutide, Nexium, Gabapentin, and Nortriptyline daily and Albuterol prn.    The history is provided by the patient.    Past Medical History:  Diagnosis Date  . Allergy   . Asthma   . Bronchitis   . Cataract   . Diverticulosis   . DM2 (diabetes mellitus, type 2) ([place])   . GERD (gastroesophageal reflux disease)   . Heart murmur   . Hyperplastic colon polyp   . Hypertension     Patient Active Problem List   Diagnosis Date Noted  . Type 2 diabetes mellitus without complication, without long-term current use of insulin ([place]) 09/06/2017  . Diverticulosis of colon without hemorrhage 03/09/2015  . Atypical chest pain 03/09/2015  . Hyperlipidemia 03/09/2015  . Gastroesophageal reflux disease without esophagitis  03/09/2015  . HTN (hypertension) 04/01/2013  . Asthma 04/01/2013    Past Surgical History:  Procedure Laterality Date  . CESAREAN SECTION    . COLONOSCOPY    . ESOPHAGOGASTRODUODENOSCOPY    . TONSILLECTOMY      OB History   No obstetric history on file.      Home Medications    Prior to Admission medications   Medication Sig Start Date End Date Taking? Authorizing Provider  chlorpheniramine-HYDROcodone (TUSSIONEX PENNKINETIC ER) 10-8 MG/5ML SUER Take 5 mLs by mouth every 12 (twelve) hours as needed for cough. 06/23/20  Yes Denesia Donelan, Nicholes Stairs, NP  molnupiravir EUA 200 mg CAPS Take 4 capsules (800 mg total) by mouth 2 (two) times daily for 5 days. 06/23/20 06/28/20 Yes Lister Brizzi, Nicholes Stairs, NP  albuterol (VENTOLIN HFA) 108 (90 Base) MCG/ACT inhaler Inhale 2 puffs into the lungs every 6 (six) hours as needed for wheezing (or cough). 06/23/20   Katy Apo, NP  amLODipine (NORVASC) 10 MG tablet Take 1 tablet (10 mg total) by mouth daily. 10/31/19   Daleen Squibb, MD  blood glucose meter kit and supplies Per insurance preference. Check glucose once a day, alternate between fasting and 2 hours after dinner. 07/18/18   Daleen Squibb, MD  Blood Pressure Monitor KIT 1 Units by Does not apply route daily. 06/03/17   Tenna Delaine D, PA-C  esomeprazole (NEXIUM) 40 MG packet Take 40 mg by mouth daily before breakfast.  10/31/19   Jacelyn Pi, Lilia Argue, MD  ezetimibe (ZETIA) 10 MG tablet Take 1 tablet (10 mg total) by mouth daily. 10/31/19   Daleen Squibb, MD  gabapentin (NEURONTIN) 300 MG capsule Take 1-2 capsules (300-600 mg total) by mouth at bedtime. 10/31/19   Jacelyn Pi, Lilia Argue, MD  glipiZIDE (GLUCOTROL) 10 MG tablet Take 1 tablet (10 mg total) by mouth daily before breakfast. 10/31/19   Jacelyn Pi, Lilia Argue, MD  ibuprofen (ADVIL) 600 MG tablet Take 1 tablet (600 mg total) by mouth every 6 (six) hours as needed for up to 30 doses. 08/02/19   Wyvonnia Dusky, MD   metoprolol succinate (TOPROL-XL) 25 MG 24 hr tablet Take 1 tablet (25 mg total) by mouth daily. 10/31/19   Daleen Squibb, MD  nortriptyline (PAMELOR) 25 MG capsule Take 1 capsule (25 mg total) by mouth at bedtime. 10/31/19   Daleen Squibb, MD  pioglitazone (ACTOS) 30 MG tablet Take 1 tablet (30 mg total) by mouth daily. 10/31/19   Daleen Squibb, MD  rosuvastatin (CRESTOR) 40 MG tablet Take 1 tablet (40 mg total) by mouth daily. 10/31/19   Daleen Squibb, MD  Semaglutide 7 MG TABS Take 7 mg by mouth daily. 10/31/19   Daleen Squibb, MD  triamterene-hydrochlorothiazide (MAXZIDE-25) 37.5-25 MG tablet Take 1 tablet by mouth daily. 10/31/19   Daleen Squibb, MD    Family History Family History  Problem Relation Age of Onset  . Cancer Mother        ovarian cancer  . Heart disease Mother   . Liver disease Mother   . Hyperlipidemia Mother   . Stroke Mother   . Cancer Father        lung cancer  . Cancer Sister   . Hyperlipidemia Sister   . Cancer Sister   . Hyperlipidemia Sister   . Diabetes Maternal Aunt   . Colon cancer Maternal Aunt        x2 aunts  . Colon cancer Maternal Uncle        2 uncles  . Colon cancer Paternal Grandfather   . Esophageal cancer Neg Hx   . Rectal cancer Neg Hx   . Stomach cancer Neg Hx     Social History Social History   Tobacco Use  . Smoking status: Never Smoker  . Smokeless tobacco: Never Used  Vaping Use  . Vaping Use: Never used  Substance Use Topics  . Alcohol use: No  . Drug use: No     Allergies   Lisinopril, Peanut-containing drug products, and Tramadol   Review of Systems Review of Systems  Constitutional: Positive for activity change, appetite change, chills, diaphoresis, fatigue and fever.  HENT: Positive for postnasal drip and sore throat. Negative for congestion, ear discharge, ear pain, facial swelling, mouth sores, nosebleeds, sinus pressure and sinus pain.   Eyes: Negative for pain,  discharge, redness and itching.  Respiratory: Positive for cough and chest tightness. Negative for shortness of breath and wheezing.   Gastrointestinal: Positive for diarrhea and nausea. Negative for vomiting.  Genitourinary: Negative for decreased urine volume and difficulty urinating.  Musculoskeletal: Positive for arthralgias, back pain, myalgias and neck pain. Negative for neck stiffness.  Skin: Negative for color change and rash.  Allergic/Immunologic: Positive for environmental allergies and food allergies.  Neurological: Positive for light-headedness, numbness and headaches. Negative for dizziness, tremors, seizures, syncope and speech difficulty.  Hematological: Negative for adenopathy.  Does not bruise/bleed easily.     Physical Exam Triage Vital Signs ED Triage Vitals  Enc Vitals Group     BP 06/23/20 1212 (!) 170/88     Pulse Rate 06/23/20 1212 76     Resp 06/23/20 1212 17     Temp 06/23/20 1212 98.9 F (37.2 C)     Temp Source 06/23/20 1212 Oral     SpO2 06/23/20 1212 95 %     Weight --      Height --      Head Circumference --      Peak Flow --      Pain Score 06/23/20 1210 6     Pain Loc --      Pain Edu? --      Excl. in [place]? --    No data found.  Updated Vital Signs BP (!) 170/88 (BP Location: Right Arm)   Pulse 76   Temp 98.9 F (37.2 C) (Oral)   Resp 17   SpO2 95%   Visual Acuity Right Eye Distance:   Left Eye Distance:   Bilateral Distance:    Right Eye Near:   Left Eye Near:    Bilateral Near:     Physical Exam Vitals and nursing note reviewed.  Constitutional:      General: She is awake. She is not in acute distress.    Appearance: She is well-developed. She is ill-appearing.     Comments: She is sitting in the exam chair in no acute distress but appears tired and ill.   HENT:     Head: Normocephalic and atraumatic.     Right Ear: Hearing, tympanic membrane, ear canal and external ear normal.     Left Ear: Hearing, tympanic membrane, ear  canal and external ear normal.     Nose: Nose normal.     Right Sinus: No maxillary sinus tenderness or frontal sinus tenderness.     Left Sinus: No maxillary sinus tenderness or frontal sinus tenderness.     Mouth/Throat:     Lips: Pink.     Mouth: Mucous membranes are moist.     Pharynx: Uvula midline. Posterior oropharyngeal erythema present. No pharyngeal swelling, oropharyngeal exudate or uvula swelling.  Eyes:     Extraocular Movements: Extraocular movements intact.     Conjunctiva/sclera: Conjunctivae normal.  Cardiovascular:     Rate and Rhythm: Normal rate and regular rhythm.     Heart sounds: Normal heart sounds.  Pulmonary:     Effort: Pulmonary effort is normal. No tachypnea, prolonged expiration, respiratory distress or retractions.     Breath sounds: Normal air entry. No decreased air movement. Examination of the right-upper field reveals wheezing. Examination of the left-upper field reveals wheezing. Examination of the right-middle field reveals wheezing. Examination of the right-lower field reveals wheezing. Examination of the left-lower field reveals wheezing. Wheezing present. No decreased breath sounds, rhonchi or rales.  Musculoskeletal:     Cervical back: Neck supple. No rigidity.  Lymphadenopathy:     Cervical: No cervical adenopathy.  Skin:    General: Skin is warm and dry.     Capillary Refill: Capillary refill takes less than 2 seconds.     Findings: No rash.  Neurological:     General: No focal deficit present.     Mental Status: She is alert and oriented to person, place, and time.  Psychiatric:        Mood and Affect: Mood normal.  Behavior: Behavior normal. Behavior is cooperative.        Thought Content: Thought content normal.        Judgment: Judgment normal.      UC Treatments / Results  Labs (all labs ordered are listed, but only abnormal results are displayed) Labs Reviewed - No data to display  EKG   Radiology No results  found.  Procedures Procedures (including critical care time)  Medications Ordered in UC Medications - No data to display  Initial Impression / Assessment and Plan / UC Course  I have reviewed the triage vital signs and the nursing notes.  Pertinent labs & imaging results that were available during my care of the patient were reviewed by me and considered in my medical decision making (see chart for details).    Since patient tested positive for COVID 19 with PCR testing and has risk factors for more concerning infection/disease course, will treat with antiviral medication. May take Molnupiravir 813m twice a day for 5 days. Use Albuterol inhaler (new Rx provided) 2 puffs every 6 hours as needed for wheezing and cough. Continue to take OTC Mucinex as directed. Continue to increase water and fluid intake to help loosen up mucus in chest and stay well hydrated. Rest. Stay at home. May take Tussionex 1 teaspoon every 12 hours as needed for cough- use mainly at night- patient has taken this medication before without any concerning side effects. Blood pressure elevated today- patient has not taken her meds in 4 to 5 days- recommend restart Norvasc, Toprol XL and Maxzide today. Note written for work. If pain increases, any difficulty breathing occurs or unable to keep down any fluids, go to the ER ASAP for evaluation. Otherwise, follow-up in 4 to 5 days for recheck.   Final Clinical Impressions(s) / UC Diagnoses   Final diagnoses:  [id number] infection  Cough  Generalized body aches  Elevated blood pressure reading with diagnosis of hypertension     Discharge Instructions     Recommend start Molnupiravir 8042m(4 pills) twice a day for 5 days. May use Albuterol inhaler 2 puffs every 6 hours as needed for cough or wheezing. Continue to take Mucinex as directed. Continue to increase water and fluid intake to stay well hydrated and to help loosen up any mucus in the chest. Rest. Stay at home.  May take Tussionex cough syrup 1 teaspoon every 12 hours as needed. Restart blood pressure medication today. If pain increases, any difficulty breathing occurs, or unable to keep down any fluids or food, go to the ER ASAP for evaluation. Otherwise, follow-up in 4 to 5 days for recheck.     ED Prescriptions    Medication Sig Dispense Auth. Provider   molnupiravir EUA 200 mg CAPS Take 4 capsules (800 mg total) by mouth 2 (two) times daily for 5 days. 40 capsule AmKaty ApoNP   albuterol (VENTOLIN HFA) 108 (90 Base) MCG/ACT inhaler Inhale 2 puffs into the lungs every 6 (six) hours as needed for wheezing (or cough). 1 each AmKaty ApoNP   chlorpheniramine-HYDROcodone (TUSSIONEX PENNKINETIC ER) 10-8 MG/5ML SUER Take 5 mLs by mouth every 12 (twelve) hours as needed for cough. 70 mL AmKaty ApoNP     PDMP was reviewed this encounter. Last active Rx was for Tylenol #3 on 11/2019. I feel the benefits outweigh the risks of a controlled cough medication at this time.     AmKaty ApoNP 06/24/20 2023

## 2020-06-23 NOTE — ED Triage Notes (Addendum)
Pt presents with back pain, arm pain, fatigue, and cough X 4 days.  Pt was diagnosed with covid on Sunday 06/21/2020

## 2020-07-03 ENCOUNTER — Ambulatory Visit: Payer: PPO | Admitting: Orthopaedic Surgery

## 2020-07-06 ENCOUNTER — Other Ambulatory Visit: Payer: Self-pay

## 2020-07-07 ENCOUNTER — Ambulatory Visit (INDEPENDENT_AMBULATORY_CARE_PROVIDER_SITE_OTHER): Payer: PPO | Admitting: Emergency Medicine

## 2020-07-07 ENCOUNTER — Encounter: Payer: Self-pay | Admitting: Emergency Medicine

## 2020-07-07 VITALS — BP 138/80 | HR 86 | Temp 98.8°F | Ht 64.0 in | Wt 144.6 lb

## 2020-07-07 DIAGNOSIS — E1159 Type 2 diabetes mellitus with other circulatory complications: Secondary | ICD-10-CM

## 2020-07-07 DIAGNOSIS — E785 Hyperlipidemia, unspecified: Secondary | ICD-10-CM

## 2020-07-07 DIAGNOSIS — G8929 Other chronic pain: Secondary | ICD-10-CM | POA: Diagnosis not present

## 2020-07-07 DIAGNOSIS — I152 Hypertension secondary to endocrine disorders: Secondary | ICD-10-CM

## 2020-07-07 DIAGNOSIS — K219 Gastro-esophageal reflux disease without esophagitis: Secondary | ICD-10-CM

## 2020-07-07 DIAGNOSIS — E782 Mixed hyperlipidemia: Secondary | ICD-10-CM

## 2020-07-07 DIAGNOSIS — I10 Essential (primary) hypertension: Secondary | ICD-10-CM

## 2020-07-07 DIAGNOSIS — K5730 Diverticulosis of large intestine without perforation or abscess without bleeding: Secondary | ICD-10-CM | POA: Diagnosis not present

## 2020-07-07 DIAGNOSIS — E119 Type 2 diabetes mellitus without complications: Secondary | ICD-10-CM

## 2020-07-07 DIAGNOSIS — Z23 Encounter for immunization: Secondary | ICD-10-CM

## 2020-07-07 DIAGNOSIS — M25512 Pain in left shoulder: Secondary | ICD-10-CM | POA: Diagnosis not present

## 2020-07-07 DIAGNOSIS — E1169 Type 2 diabetes mellitus with other specified complication: Secondary | ICD-10-CM | POA: Diagnosis not present

## 2020-07-07 LAB — CBC WITH DIFFERENTIAL/PLATELET
Basophils Absolute: 0 10*3/uL (ref 0.0–0.1)
Basophils Relative: 0.4 % (ref 0.0–3.0)
Eosinophils Absolute: 0.1 10*3/uL (ref 0.0–0.7)
Eosinophils Relative: 1.9 % (ref 0.0–5.0)
HCT: 35.3 % — ABNORMAL LOW (ref 36.0–46.0)
Hemoglobin: 11.9 g/dL — ABNORMAL LOW (ref 12.0–15.0)
Lymphocytes Relative: 25.7 % (ref 12.0–46.0)
Lymphs Abs: 1.3 10*3/uL (ref 0.7–4.0)
MCHC: 33.7 g/dL (ref 30.0–36.0)
MCV: 84.9 fl (ref 78.0–100.0)
Monocytes Absolute: 0.4 10*3/uL (ref 0.1–1.0)
Monocytes Relative: 8.9 % (ref 3.0–12.0)
Neutro Abs: 3.2 10*3/uL (ref 1.4–7.7)
Neutrophils Relative %: 63.1 % (ref 43.0–77.0)
Platelets: 270 10*3/uL (ref 150.0–400.0)
RBC: 4.16 Mil/uL (ref 3.87–5.11)
RDW: 14.5 % (ref 11.5–15.5)
WBC: 5 10*3/uL (ref 4.0–10.5)

## 2020-07-07 LAB — COMPREHENSIVE METABOLIC PANEL
ALT: 11 U/L (ref 0–35)
AST: 13 U/L (ref 0–37)
Albumin: 4.4 g/dL (ref 3.5–5.2)
Alkaline Phosphatase: 72 U/L (ref 39–117)
BUN: 18 mg/dL (ref 6–23)
CO2: 25 mEq/L (ref 19–32)
Calcium: 9.7 mg/dL (ref 8.4–10.5)
Chloride: 105 mEq/L (ref 96–112)
Creatinine, Ser: 0.82 mg/dL (ref 0.40–1.20)
GFR: 72.85 mL/min (ref >60.00)
Glucose, Bld: 149 mg/dL — ABNORMAL HIGH (ref 70–99)
Potassium: 4 mEq/L (ref 3.5–5.1)
Sodium: 141 mEq/L (ref 135–145)
Total Bilirubin: 0.4 mg/dL (ref 0.2–1.2)
Total Protein: 7.5 g/dL (ref 6.0–8.3)

## 2020-07-07 LAB — HEMOGLOBIN A1C: Hgb A1c MFr Bld: 7.3 % — ABNORMAL HIGH (ref 4.6–6.5)

## 2020-07-07 LAB — LIPID PANEL
Cholesterol: 166 mg/dL (ref 0–200)
HDL: 45.6 mg/dL (ref >39.00)
LDL Cholesterol: 88 mg/dL (ref 0–99)
NonHDL: 120.35
Total CHOL/HDL Ratio: 4
Triglycerides: 163 mg/dL — ABNORMAL HIGH (ref 0.0–149.0)
VLDL: 32.6 mg/dL (ref 0.0–40.0)

## 2020-07-07 MED ORDER — AMLODIPINE BESYLATE 10 MG PO TABS: 10.0000 mg | ORAL_TABLET | Freq: Every day | ORAL | 1 refills | Status: DC

## 2020-07-07 MED ORDER — GLIPIZIDE 10 MG PO TABS
10.0000 mg | ORAL_TABLET | Freq: Every day | ORAL | 1 refills | Status: DC
Start: 2020-07-07 — End: 2021-07-07

## 2020-07-07 MED ORDER — SEMAGLUTIDE 7 MG PO TABS: 7.0000 mg | ORAL_TABLET | Freq: Every day | ORAL | 1 refills | Status: DC

## 2020-07-07 MED ORDER — TRIAMTERENE-HCTZ 37.5-25 MG PO TABS: 1.0000 | ORAL_TABLET | Freq: Every day | ORAL | 1 refills | Status: DC

## 2020-07-07 MED ORDER — GABAPENTIN 300 MG PO CAPS: 300.0000 mg | ORAL_CAPSULE | Freq: Every day | ORAL | 0 refills | Status: DC

## 2020-07-07 MED ORDER — ROSUVASTATIN CALCIUM 40 MG PO TABS: 40.0000 mg | ORAL_TABLET | Freq: Every day | ORAL | 3 refills | Status: DC

## 2020-07-07 MED ORDER — METOPROLOL SUCCINATE ER 25 MG PO TB24: 25.0000 mg | ORAL_TABLET | Freq: Every day | ORAL | 1 refills | Status: DC

## 2020-07-07 NOTE — Assessment & Plan Note (Signed)
Diet and nutrition discussed.  Continue rosuvastatin 40 mg daily.

## 2020-07-07 NOTE — Assessment & Plan Note (Signed)
Seen by orthopedist.  So far has failed physical therapy.  Symptoms continue and seem to be worsening.  MRI report reviewed with patient which showed tendinosis and 2 tears.  Advised to follow-up with orthopedist on explore surgical option.

## 2020-07-07 NOTE — Assessment & Plan Note (Signed)
Recent colonoscopy reviewed with patient.  Extensive diverticulosis but no polyps or cancer.  Diet and nutrition in order to avoid diverticulitis discussed with patient

## 2020-07-07 NOTE — Assessment & Plan Note (Addendum)
Most likely well-controlled as patient has been off diabetic medications for several weeks.  Asymptomatic.  Glipizide 10 mg daily and Rybelsus 7 mg daily refilled. Diet and nutrition discussed.  Blood work done today.

## 2020-07-07 NOTE — Progress Notes (Signed)
Erin Benitez 70 y.o.   Chief Complaint  Patient presents with  . New Patient (Initial Visit)    Discuss left shoulder and arm pain, per patient Dr Pamella Pert sent her to Orthopedic surgeon  . Abdominal Pain    Per patient continue to have stomach pain  . Medication Refill     pend    HISTORY OF PRESENT ILLNESS: This is a 70 y.o. female first visit to this office, former patient of Dr. Pamella Pert, here to establish care with me. Has the following chronic medical problems: #1 diabetes: Was taking glipizide and Rybelsus but not for the past 3 weeks. #2 hypertension, presently on amlodipine 10 mg and Maxide-25 and Toprol XL 25 mg daily Recent echocardiogram report reviewed.  Ejection fraction 67% with normal global function #3 history of heart murmur: Echocardiogram revealed trace aortic stenosis #4 chronic left shoulder pain.  Has seen orthopedist in the last 6 months.  Had physical therapy. MRI revealed tendinosis and couple of tears.  Conservative treatment not working. #5 chronic heartburn: Recent upper endoscopy showed erosive gastritis.  Intermittent upper abdominal pain.  Nexium works well for her. #6 recent colonoscopy showed diffuse diverticulosis but no cancer no polyps.  #7 dyslipidemia: On rosuvastatin 40 mg daily. No other complaints or medical concerns today.  HPI   Prior to Admission medications   Medication Sig Start Date End Date Taking? Authorizing Provider  albuterol (VENTOLIN HFA) 108 (90 Base) MCG/ACT inhaler Inhale 2 puffs into the lungs every 6 (six) hours as needed for wheezing (or cough). 06/23/20  Yes Amyot, Nicholes Stairs, NP  amLODipine (NORVASC) 10 MG tablet Take 1 tablet (10 mg total) by mouth daily. 10/31/19  Yes Jacelyn Pi, Lilia Argue, MD  chlorpheniramine-HYDROcodone [facility] ER) 10-8 MG/5ML SUER Take 5 mLs by mouth every 12 (twelve) hours as needed for cough. 06/23/20  Yes Katy Apo, NP  esomeprazole (NEXIUM) 40 MG packet Take 40 mg by mouth  daily before breakfast. 10/31/19  Yes Jacelyn Pi, Lilia Argue, MD  gabapentin (NEURONTIN) 300 MG capsule Take 1-2 capsules (300-600 mg total) by mouth at bedtime. 10/31/19  Yes Jacelyn Pi, Lilia Argue, MD  glipiZIDE (GLUCOTROL) 10 MG tablet Take 1 tablet (10 mg total) by mouth daily before breakfast. 10/31/19  Yes Jacelyn Pi, Lilia Argue, MD  metoprolol succinate (TOPROL-XL) 25 MG 24 hr tablet Take 1 tablet (25 mg total) by mouth daily. 10/31/19  Yes Jacelyn Pi, Lilia Argue, MD  nortriptyline (PAMELOR) 25 MG capsule Take 1 capsule (25 mg total) by mouth at bedtime. 10/31/19  Yes Jacelyn Pi, Lilia Argue, MD  pioglitazone (ACTOS) 30 MG tablet Take 1 tablet (30 mg total) by mouth daily. 10/31/19  Yes Jacelyn Pi, Lilia Argue, MD  rosuvastatin (CRESTOR) 40 MG tablet Take 1 tablet (40 mg total) by mouth daily. 10/31/19  Yes Jacelyn Pi, Lilia Argue, MD  Semaglutide 7 MG TABS Take 7 mg by mouth daily. 10/31/19  Yes Jacelyn Pi, Lilia Argue, MD  triamterene-hydrochlorothiazide (MAXZIDE-25) 37.5-25 MG tablet Take 1 tablet by mouth daily. 10/31/19  Yes Jacelyn Pi, Lilia Argue, MD  blood glucose meter kit and supplies Per insurance preference. Check glucose once a day, alternate between fasting and 2 hours after dinner. 07/18/18   Daleen Squibb, MD  Blood Pressure Monitor KIT 1 Units by Does not apply route daily. 06/03/17   Tenna Delaine D, PA-C  ezetimibe (ZETIA) 10 MG tablet Take 1 tablet (10 mg total) by mouth daily. Patient not taking: Reported  on 07/07/2020 10/31/19   Jacelyn Pi, Lilia Argue, MD  ibuprofen (ADVIL) 600 MG tablet Take 1 tablet (600 mg total) by mouth every 6 (six) hours as needed for up to 30 doses. Patient not taking: Reported on 07/07/2020 08/02/19   Wyvonnia Dusky, MD    Allergies  Allergen Reactions  . Lisinopril Swelling  . Peanut-Containing Drug Products Anaphylaxis, Hives, Swelling and Other (See Comments)    Pistachio's - swelling of the throat     . Tramadol Nausea And Vomiting    Headache &  dizziness with n&v    Patient Active Problem List   Diagnosis Date Noted  . Type 2 diabetes mellitus without complication, without long-term current use of insulin ([place]) 09/06/2017  . Diverticulosis of colon without hemorrhage 03/09/2015  . Hyperlipidemia 03/09/2015  . Gastroesophageal reflux disease without esophagitis 03/09/2015  . HTN (hypertension) 04/01/2013  . Asthma 04/01/2013    Past Medical History:  Diagnosis Date  . Allergy   . Asthma   . Bronchitis   . Cataract   . Diverticulosis   . DM2 (diabetes mellitus, type 2) ([place])   . GERD (gastroesophageal reflux disease)   . Heart murmur   . Hyperplastic colon polyp   . Hypertension     Past Surgical History:  Procedure Laterality Date  . CESAREAN SECTION    . COLONOSCOPY    . ESOPHAGOGASTRODUODENOSCOPY    . TONSILLECTOMY      Social History   Socioeconomic History  . Marital status: Single    Spouse name: Not on file  . Number of children: 1  . Years of education: Not on file  . Highest education level: Not on file  Occupational History  . Occupation: Educator  Tobacco Use  . Smoking status: Never Smoker  . Smokeless tobacco: Never Used  Vaping Use  . Vaping Use: Never used  Substance and Sexual Activity  . Alcohol use: No  . Drug use: No  . Sexual activity: Yes  Other Topics Concern  . Not on file  Social History Narrative   She reports she is single. She has 1 daughter. 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     2 caffeinated beverages daily.   No alcohol tobacco or drug use.   Social Determinants of Health   Financial Resource Strain: Not on file  Food Insecurity: Not on file  Transportation Needs: Not on file  Physical Activity: Not on file  Stress: Not on file  Social Connections: Not on file  Intimate Partner Violence: Not on  file    Family History  Problem Relation Age of Onset  . Cancer Mother        ovarian cancer  . Heart disease Mother   . Liver disease Mother   . Hyperlipidemia Mother   . Stroke Mother   . Cancer Father        lung cancer  . Cancer Sister   . Hyperlipidemia Sister   . Cancer Sister   . Hyperlipidemia Sister   . Diabetes Maternal Aunt   . Colon cancer Maternal Aunt        x2 aunts  . Colon cancer Maternal Uncle        2 uncles  . Colon cancer Paternal Grandfather   . Esophageal cancer Neg Hx   . Rectal cancer  Neg Hx   . Stomach cancer Neg Hx      Review of Systems  Constitutional: Negative.  Negative for chills and fever.  HENT: Negative.  Negative for congestion and sore throat.   Respiratory: Negative.  Negative for cough and shortness of breath.   Cardiovascular: Negative.  Negative for chest pain and palpitations.  Gastrointestinal: Positive for heartburn. Negative for abdominal pain, diarrhea, nausea and vomiting.  Genitourinary: Negative.  Negative for dysuria and hematuria.  Musculoskeletal: Negative.  Negative for myalgias and neck pain.  Skin: Negative.  Negative for rash.  Neurological: Negative.  Negative for dizziness and headaches.  All other systems reviewed and are negative.   Today's Vitals   07/07/20 0854  BP: (!) 160/80  Pulse: 86  Temp: 98.8 F (37.1 C)  TempSrc: Oral  SpO2: 96%  Weight: 144 lb 9.6 oz (65.6 kg)  Height: _0  (1.626 m)   Body mass index is 24.82 kg/m.  Physical Exam Constitutional:      Appearance: Normal appearance.  HENT:     Head: Normocephalic.  Eyes:     Extraocular Movements: Extraocular movements intact.     Conjunctiva/sclera: Conjunctivae normal.     Pupils: Pupils are equal, round, and reactive to light.  Cardiovascular:     Rate and Rhythm: Normal rate and regular rhythm.     Heart sounds: Murmur (Aortic systolic murmur 3/6) heard.    Pulmonary:     Effort: Pulmonary effort is normal.     Breath  sounds: Normal breath sounds.  Abdominal:     Palpations: Abdomen is soft.     Tenderness: There is no abdominal tenderness.  Musculoskeletal:     Cervical back: Neck supple. No rigidity or tenderness.     Right lower leg: No edema.     Left lower leg: No edema.  Lymphadenopathy:     Cervical: No cervical adenopathy.  Skin:    General: Skin is warm and dry.     Capillary Refill: Capillary refill takes less than 2 seconds.  Neurological:     General: No focal deficit present.     Mental Status: She is alert and oriented to person, place, and time.  Psychiatric:        Mood and Affect: Mood normal.        Behavior: Behavior normal.      ASSESSMENT & PLAN: A total of 45 minutes was spent with the patient and counseling/coordination of care regarding establishing care with me, review of multiple chronic medical problems under management, review of all medications, review of most recent office visit notes, review of most recent blood work results, review of most recent echocardiogram, upper endoscopy, colonoscopy, MRI of left shoulder, education on nutrition, Cardiovascular risks associated with diabetes hypertension and dyslipidemia and ways to avoid them, health maintenance items, documentation, prognosis and need for follow-up.  Hypertension associated with diabetes ([place]) Well-controlled hypertension.  Continue amlodipine 10 mg daily, Toprol XL 25 mg daily, and Maxide-25 daily.  Gastroesophageal reflux disease without esophagitis Occasional heartburn responsive to Nexium. Recent upper endoscopy reviewed.  Showed erosive gastritis. Diet and nutrition discussed  Dyslipidemia associated with type 2 diabetes mellitus ([place]) Most likely well-controlled as patient has been off diabetic medications for several weeks.  Asymptomatic.  Glipizide 10 mg daily and Rybelsus 7 mg daily refilled. Diet and nutrition discussed.  Blood work done today.  Hyperlipidemia Diet and nutrition discussed.   Continue rosuvastatin 40 mg daily.  Diverticulosis of colon without hemorrhage Recent  colonoscopy reviewed with patient.  Extensive diverticulosis but no polyps or cancer.  Diet and nutrition in order to avoid diverticulitis discussed with patient  Chronic left shoulder pain Seen by orthopedist.  So far has failed physical therapy.  Symptoms continue and seem to be worsening.  MRI report reviewed with patient which showed tendinosis and 2 tears.  Advised to follow-up with orthopedist on explore surgical option.  Shani was seen today for new patient (initial visit), abdominal pain and medication refill.  Diagnoses and all orders for this visit:  Hypertension associated with diabetes ([place]) -     gabapentin (NEURONTIN) 300 MG capsule; Take 1-2 capsules (300-600 mg total) by mouth at bedtime. -     glipiZIDE (GLUCOTROL) 10 MG tablet; Take 1 tablet (10 mg total) by mouth daily before breakfast. -     metoprolol succinate (TOPROL-XL) 25 MG 24 hr tablet; Take 1 tablet (25 mg total) by mouth daily. -     Semaglutide 7 MG TABS; Take 7 mg by mouth daily. -     CBC with Differential/Platelet -     Comprehensive metabolic panel -     Hemoglobin A1c  Essential hypertension -     amLODipine (NORVASC) 10 MG tablet; Take 1 tablet (10 mg total) by mouth daily. -     triamterene-hydrochlorothiazide (MAXZIDE-25) 37.5-25 MG tablet; Take 1 tablet by mouth daily.  Mixed hyperlipidemia -     rosuvastatin (CRESTOR) 40 MG tablet; Take 1 tablet (40 mg total) by mouth daily.  Dyslipidemia associated with type 2 diabetes mellitus ([facility]) -     Lipid panel  Need for shingles vaccine -     Varicella-zoster vaccine IM  Gastroesophageal reflux disease without esophagitis  Type 2 diabetes mellitus without complication, without long-term current use of insulin ([facility])  Diverticulosis of colon without hemorrhage  Chronic left shoulder pain    Patient Instructions   Preventive Care 17 Years and Older,  Female Preventive care refers to lifestyle choices and visits with your health care provider that can promote health and wellness. This includes:  A yearly physical exam. This is also called an annual wellness visit.  Regular dental and eye exams.  Immunizations.  Screening for certain conditions.  Healthy lifestyle choices, such as: ? Eating a healthy diet. ? Getting regular exercise. ? Not using drugs or products that contain nicotine and tobacco. ? Limiting alcohol use. What can I expect for my preventive care visit? Physical exam Your health care provider will check your:  Height and weight. These may be used to calculate your BMI (body mass index). BMI is a measurement that tells if you are at a healthy weight.  Heart rate and blood pressure.  Body temperature.  Skin for abnormal spots. Counseling Your health care provider may ask you questions about your:  Past medical problems.  Family's medical history.  Alcohol, tobacco, and drug use.  Emotional well-being.  Home life and relationship well-being.  Sexual activity.  Diet, exercise, and sleep habits.  History of falls.  Memory and ability to understand (cognition).  Work and work Statistician.  Pregnancy and menstrual history.  Access to firearms. What immunizations do I need? Vaccines are usually given at various ages, according to a schedule. Your health care provider will recommend vaccines for you based on your age, medical history, and lifestyle or other factors, such as travel or where you work.   What tests do I need? Blood tests  Lipid and cholesterol levels. These may be checked  every 5 years, or more often depending on your overall health.  Hepatitis C test.  Hepatitis B test. Screening  Lung cancer screening. You may have this screening every year starting at age 70 if you have a 30-pack-year history of smoking and currently smoke or have quit within the past 15 years.  Colorectal  cancer screening. ? All adults should have this screening starting at age 75 and continuing until age 90. ? Your health care provider may recommend screening at age 33 if you are at increased risk. ? You will have tests every 1-10 years, depending on your results and the type of screening test.  Diabetes screening. ? This is done by checking your blood sugar (glucose) after you have not eaten for a while (fasting). ? You may have this done every 1-3 years.  Mammogram. ? This may be done every 1-2 years. ? Talk with your health care provider about how often you should have regular mammograms.  Abdominal aortic aneurysm (AAA) screening. You may need this if you are a current or former smoker.  BRCA-related cancer screening. This may be done if you have a family history of breast, ovarian, tubal, or peritoneal cancers. Other tests  STD (sexually transmitted disease) testing, if you are at risk.  Bone density scan. This is done to screen for osteoporosis. You may have this done starting at age 48. Talk with your health care provider about your test results, treatment options, and if necessary, the need for more tests. Follow these instructions at home: Eating and drinking  Eat a diet that includes fresh fruits and vegetables, whole grains, lean protein, and low-fat dairy products. Limit your intake of foods with high amounts of sugar, saturated fats, and salt.  Take vitamin and mineral supplements as recommended by your health care provider.  Do not drink alcohol if your health care provider tells you not to drink.  If you drink alcohol: ? Limit how much you have to 0-1 drink a day. ? Be aware of how much alcohol is in your drink. In the [place] one drink equals one 12 oz bottle of beer (355 mL), one 5 oz glass of wine (148 mL), or one 1 oz glass of hard liquor (44 mL).   Lifestyle  Take daily care of your teeth and gums. Brush your teeth every morning and night with fluoride  toothpaste. Floss one time each day.  Stay active. Exercise for at least 30 minutes 5 or more days each week.  Do not use any products that contain nicotine or tobacco, such as cigarettes, e-cigarettes, and chewing tobacco. If you need help quitting, ask your health care provider.  Do not use drugs.  If you are sexually active, practice safe sex. Use a condom or other form of protection in order to prevent STIs (sexually transmitted infections).  Talk with your health care provider about taking a low-dose aspirin or statin.  Find healthy ways to cope with stress, such as: ? Meditation, yoga, or listening to music. ? Journaling. ? Talking to a trusted person. ? Spending time with friends and family. Safety  Always wear your seat belt while driving or riding in a vehicle.  Do not drive: ? If you have been drinking alcohol. Do not ride with someone who has been drinking. ? When you are tired or distracted. ? While texting.  Wear a helmet and other protective equipment during sports activities.  If you have firearms in your house, make sure you follow all  gun safety procedures. What's next?  Visit your health care provider once a year for an annual wellness visit.  Ask your health care provider how often you should have your eyes and teeth checked.  Stay up to date on all vaccines. This information is not intended to replace advice given to you by your health care provider. Make sure you discuss any questions you have with your health care provider. Document Revised: 01/08/2020 Document Reviewed: 01/11/2018 Elsevier Patient Education  2021 [place], MD [facility]

## 2020-07-07 NOTE — Assessment & Plan Note (Signed)
Well-controlled hypertension.  Continue amlodipine 10 mg daily, Toprol XL 25 mg daily, and Maxide-25 daily.

## 2020-07-07 NOTE — Assessment & Plan Note (Signed)
Occasional heartburn responsive to Nexium. Recent upper endoscopy reviewed.  Showed erosive gastritis. Diet and nutrition discussed

## 2020-07-07 NOTE — Patient Instructions (Signed)
Preventive Care 70 Years and Older, Female Preventive care refers to lifestyle choices and visits with your health care provider that can promote health and wellness. This includes:  A yearly physical exam. This is also called an annual wellness visit.  Regular dental and eye exams.  Immunizations.  Screening for certain conditions.  Healthy lifestyle choices, such as: ? Eating a healthy diet. ? Getting regular exercise. ? Not using drugs or products that contain nicotine and tobacco. ? Limiting alcohol use. What can I expect for my preventive care visit? Physical exam Your health care provider will check your:  Height and weight. These may be used to calculate your BMI (body mass index). BMI is a measurement that tells if you are at a healthy weight.  Heart rate and blood pressure.  Body temperature.  Skin for abnormal spots. Counseling Your health care provider may ask you questions about your:  Past medical problems.  Family's medical history.  Alcohol, tobacco, and drug use.  Emotional well-being.  Home life and relationship well-being.  Sexual activity.  Diet, exercise, and sleep habits.  History of falls.  Memory and ability to understand (cognition).  Work and work Statistician.  Pregnancy and menstrual history.  Access to firearms. What immunizations do I need? Vaccines are usually given at various ages, according to a schedule. Your health care provider will recommend vaccines for you based on your age, medical history, and lifestyle or other factors, such as travel or where you work.   What tests do I need? Blood tests  Lipid and cholesterol levels. These may be checked every 5 years, or more often depending on your overall health.  Hepatitis C test.  Hepatitis B test. Screening  Lung cancer screening. You may have this screening every year starting at age 70 if you have a 30-pack-year history of smoking and currently smoke or have quit within  the past 15 years.  Colorectal cancer screening. ? All adults should have this screening starting at age 44 and continuing until age 70. ? Your health care provider may recommend screening at age 70 if you are at increased risk. ? You will have tests every 1-10 years, depending on your results and the type of screening test.  Diabetes screening. ? This is done by checking your blood sugar (glucose) after you have not eaten for a while (fasting). ? You may have this done every 1-3 years.  Mammogram. ? This may be done every 1-2 years. ? Talk with your health care provider about how often you should have regular mammograms.  Abdominal aortic aneurysm (AAA) screening. You may need this if you are a current or former smoker.  BRCA-related cancer screening. This may be done if you have a family history of breast, ovarian, tubal, or peritoneal cancers. Other tests  STD (sexually transmitted disease) testing, if you are at risk.  Bone density scan. This is done to screen for osteoporosis. You may have this done starting at age 70. Talk with your health care provider about your test results, treatment options, and if necessary, the need for more tests. Follow these instructions at home: Eating and drinking  Eat a diet that includes fresh fruits and vegetables, whole grains, lean protein, and low-fat dairy products. Limit your intake of foods with high amounts of sugar, saturated fats, and salt.  Take vitamin and mineral supplements as recommended by your health care provider.  Do not drink alcohol if your health care provider tells you not to drink.  If you drink alcohol: ? Limit how much you have to 0-1 drink a day. ? Be aware of how much alcohol is in your drink. In the [place] one drink equals one 12 oz bottle of beer (355 mL), one 5 oz glass of wine (148 mL), or one 1 oz glass of hard liquor (44 mL).   Lifestyle  Take daily care of your teeth and gums. Brush your teeth every morning  and night with fluoride toothpaste. Floss one time each day.  Stay active. Exercise for at least 30 minutes 5 or more days each week.  Do not use any products that contain nicotine or tobacco, such as cigarettes, e-cigarettes, and chewing tobacco. If you need help quitting, ask your health care provider.  Do not use drugs.  If you are sexually active, practice safe sex. Use a condom or other form of protection in order to prevent STIs (sexually transmitted infections).  Talk with your health care provider about taking a low-dose aspirin or statin.  Find healthy ways to cope with stress, such as: ? Meditation, yoga, or listening to music. ? Journaling. ? Talking to a trusted person. ? Spending time with friends and family. Safety  Always wear your seat belt while driving or riding in a vehicle.  Do not drive: ? If you have been drinking alcohol. Do not ride with someone who has been drinking. ? When you are tired or distracted. ? While texting.  Wear a helmet and other protective equipment during sports activities.  If you have firearms in your house, make sure you follow all gun safety procedures. What's next?  Visit your health care provider once a year for an annual wellness visit.  Ask your health care provider how often you should have your eyes and teeth checked.  Stay up to date on all vaccines. This information is not intended to replace advice given to you by your health care provider. Make sure you discuss any questions you have with your health care provider. Document Revised: 01/08/2020 Document Reviewed: 01/11/2018 Elsevier Patient Education  2021 Elsevier Inc.  

## 2020-07-08 ENCOUNTER — Telehealth: Payer: Self-pay

## 2020-07-08 NOTE — Chronic Care Management (AMB) (Signed)
  Chronic Care Management   Outreach Note  07/08/2020 Name: Erin Benitez MRN: [id number] DOB: 04/10/1950  Erin Benitez is a 70 y.o. year old female who is a primary care patient of Sagardia, Ines Bloomer, MD. I reached out to [name] by phone today in response to a referral sent by [name]'s PCP, Horald Pollen, MD     An unsuccessful telephone outreach was attempted today. The patient was referred to the case management team for assistance with care management and care coordination.   Follow Up Plan: A HIPAA compliant phone message was left for the patient providing contact information and requesting a return call.  The care management team will reach out to the patient again over the next 5 days.  If patient returns call to provider office, please advise to call Tat [facility] at [place], [place], [place], [place] [address] Direct Dial: [phone] [email or web address]

## 2020-07-09 NOTE — Chronic Care Management (AMB) (Signed)
  Chronic Care Management   Outreach Note  07/09/2020 Name: Erin Benitez MRN: [id number] DOB: 10-27-1950  Erin Benitez is a 70 y.o. year old female who is a primary care patient of Sagardia, Ines Bloomer, MD. I reached out to Erin Benitez by phone today in response to a referral sent by Erin Benitez's Sagardia, Ines Bloomer, MD     A second unsuccessful telephone outreach was attempted today, returning pt call. The patient was referred to the case management team for assistance with care management and care coordination.   Follow Up Plan: A HIPAA compliant phone message was left for the patient providing contact information and requesting a return call.  The care management team will reach out to the patient again over the next 5 days.  If patient returns call to provider office, please advise to call [facility] at [place], [place], [place], [place] [address] Direct Dial: [phone] [email or web address] Website: [email or web address]

## 2020-07-29 NOTE — Chronic Care Management (AMB) (Signed)
  Chronic Care Management   Outreach Note  07/29/2020 Name: Erin Benitez MRN: [id number] DOB: [birth year]  Erin Benitez is a 70 y.o. year old female who is a primary care patient of Sagardia, Ines Bloomer, MD. I reached out to Erin Benitez by phone today in response to a referral sent by Erin Benitez PCP, Horald Pollen, MD      A second unsuccessful telephone outreach was attempted today. The patient was referred to the case management team for assistance with care management and care coordination.   Follow Up Plan: A HIPAA compliant phone message was left for the patient providing contact information and requesting a return call.  The care management team will reach out to the patient again over the next 7 days.  If patient returns call to provider office, please advise to call [facility] at [place], [place], [place], [place] [address] Direct Dial: [phone] [email or web address] Website: [email or web address]

## 2020-08-05 NOTE — Chronic Care Management (AMB) (Signed)
  Chronic Care Management   Outreach Note  08/05/2020 Name: Erin Benitez MRN: [id number] DOB: December 18, 1950  Erin Benitez is a 70 y.o. year old female who is a primary care patient of Sagardia, Ines Bloomer, MD. I reached out to [name] by phone today in response to a referral sent by [name]'s PCP, Horald Pollen, MD      Third unsuccessful telephone outreach was attempted today. The patient was referred to the case management team for assistance with care management and care coordination. The patient's primary care provider has been notified of our unsuccessful attempts to make or maintain contact with the patient. The care management team is pleased to engage with this patient at any time in the future should he/she be interested in assistance from the care management team.   Follow Up Plan: We have been unable to make contact with the patient. The care management team is available to follow up with the patient after provider conversation with the patient regarding recommendation for care management engagement and subsequent re-referral to the care management team.   Noreene Larsson, [place], [place], [place] [address] Direct Dial: [phone] [email or web address] Website: [email or web address]

## 2020-08-06 NOTE — Chronic Care Management (AMB) (Signed)
  Chronic Care Management   Note  08/06/2020 Name: Han Lysne MRN: [id number] DOB: 04/04/1950  [name] is a 70 y.o. year old female who is a primary care patient of Sagardia, Ines Bloomer, MD. I reached out to [name] by phone today in response to a referral sent by Ms. Kennadee Olano's PCP, Horald Pollen, MD      Ms. Tiley was given information about Chronic Care Management services today including:  CCM service includes personalized support from designated clinical staff supervised by her physician, including individualized plan of care and coordination with other care providers 24/7 contact phone numbers for assistance for urgent and routine care needs. Service will only be billed when office clinical staff spend 20 minutes or more in a month to coordinate care. Only one practitioner may furnish and bill the service in a calendar month. The patient may stop CCM services at any time (effective at the end of the month) by phone call to the office staff. The patient will be responsible for cost sharing (co-pay) of up to 20% of the service fee (after annual deductible is met).  Patient agreed to services and verbal consent obtained.   Follow up plan: Telephone appointment with care management team member scheduled for:09/01/2020  Noreene Larsson, [place], [place], [place] [address] Direct Dial: [phone] [email or web address] Website: [email or web address]

## 2020-09-01 ENCOUNTER — Ambulatory Visit (INDEPENDENT_AMBULATORY_CARE_PROVIDER_SITE_OTHER): Payer: PPO | Admitting: *Deleted

## 2020-09-01 DIAGNOSIS — E1159 Type 2 diabetes mellitus with other circulatory complications: Secondary | ICD-10-CM | POA: Diagnosis not present

## 2020-09-01 DIAGNOSIS — E119 Type 2 diabetes mellitus without complications: Secondary | ICD-10-CM

## 2020-09-01 DIAGNOSIS — I152 Hypertension secondary to endocrine disorders: Secondary | ICD-10-CM | POA: Diagnosis not present

## 2020-09-01 NOTE — Chronic Care Management (AMB) (Signed)
Chronic Care Management   CCM RN Visit Note  09/01/2020 Name: Erin Benitez MRN: [id number] DOB: 11-27-1950  Subjective: Unnamed Erin Benitez is a 70 y.o. year old female who is a primary care patient of Sagardia, Erin Bloomer, MD. The care management team was consulted for assistance with disease management and care coordination needs.    Engaged with patient by telephone for initial visit in response to provider referral for case management and/or care coordination services.   Consent to Services:  The patient was given information about Chronic Care Management services, agreed to services, and gave verbal consent 08/06/20 prior to initiation of services.  Please see initial visit note for detailed documentation.  Patient agreed to services and verbal consent obtained.   Assessment: Review of patient past medical history, allergies, medications, health status, including review of consultants reports, laboratory and other test data, was performed as part of comprehensive evaluation and provision of chronic care management services.   SDOH (Social Determinants of Health) assessments and interventions performed:  SDOH Interventions    Flowsheet Row Most Recent Value  SDOH Interventions   Food Insecurity Interventions Intervention Not Indicated  Housing Interventions Intervention Not Indicated  Transportation Interventions Intervention Not Indicated        CCM Care Plan  Allergies  Allergen Reactions   Lisinopril Swelling   Peanut-Containing Drug Products Anaphylaxis, Hives, Swelling and Other (See Comments)    Pistachio's - swelling of the throat      Tramadol Nausea And Vomiting    Headache & dizziness with n&v    Outpatient Encounter Medications as of 09/01/2020  Medication Sig Note   albuterol (VENTOLIN HFA) 108 (90 Base) MCG/ACT inhaler Inhale 2 puffs into the lungs every 6 (six) hours as needed for wheezing (or cough). 09/01/2020: 09/01/20: Reports has not needed recently   amLODipine  (NORVASC) 10 MG tablet Take 1 tablet (10 mg total) by mouth daily.    esomeprazole (NEXIUM) 40 MG packet Take 40 mg by mouth daily before breakfast.    glipiZIDE (GLUCOTROL) 10 MG tablet Take 1 tablet (10 mg total) by mouth daily before breakfast.    pioglitazone (ACTOS) 30 MG tablet Take 1 tablet (30 mg total) by mouth daily.    rosuvastatin (CRESTOR) 40 MG tablet Take 1 tablet (40 mg total) by mouth daily.    triamterene-hydrochlorothiazide (MAXZIDE-25) 37.5-25 MG tablet Take 1 tablet by mouth daily.    blood glucose meter kit and supplies Per insurance preference. Check glucose once a day, alternate between fasting and 2 hours after dinner.    Blood Pressure Monitor KIT 1 Units by Does not apply route daily.    chlorpheniramine-HYDROcodone (TUSSIONEX PENNKINETIC ER) 10-8 MG/5ML SUER Take 5 mLs by mouth every 12 (twelve) hours as needed for cough. (Patient not taking: Reported on 09/01/2020) 09/01/2020: 09/01/20: Patient reports no longer taking-- took when she had COVID this spring   gabapentin (NEURONTIN) 300 MG capsule Take 1-2 capsules (300-600 mg total) by mouth at bedtime. (Patient not taking: Reported on 09/01/2020) 09/01/2020: 09/01/20: Patient reports currently not taking; she is considering re-starting   metoprolol succinate (TOPROL-XL) 25 MG 24 hr tablet Take 1 tablet (25 mg total) by mouth daily. (Patient not taking: Reported on 09/01/2020) 09/01/2020: 09/01/20: Reports has not yet picked up from pharmacy- to follow up with outpatient pharmacy to obtain   nortriptyline (PAMELOR) 25 MG capsule Take 1 capsule (25 mg total) by mouth at bedtime. (Patient not taking: Reported on 09/01/2020) 09/01/2020: 09/01/20: Reports has not needed/ taken  recently   Semaglutide 7 MG TABS Take 7 mg by mouth daily. (Patient not taking: Reported on 09/01/2020) 09/01/2020: 09/01/20: Patient reports has not yet started-- to call outpatient pharmacy to follow up   No facility-administered encounter medications on file as of 09/01/2020.     Patient Active Problem List   Diagnosis Date Noted   Chronic left shoulder pain 07/07/2020   Dyslipidemia associated with type 2 diabetes mellitus ([facility]) 09/06/2017   Diverticulosis of colon without hemorrhage 03/09/2015   Hyperlipidemia 03/09/2015   Gastroesophageal reflux disease without esophagitis 03/09/2015   Hypertension associated with diabetes ([facility]) 04/01/2013   Asthma 04/01/2013   Conditions to be addressed/monitored:HTN and DMII  Care Plan : RN Care Manager Plan of Care  Updates made by Knox Royalty, RN since 09/01/2020 12:00 AM     Problem: Chronic Disease Management Needs   Priority: Medium     Long-Range Goal: Development of plan of care for long term disease management   Start Date: 09/01/2020  Expected End Date: 09/01/2021  Priority: Medium  Note:   Current Barriers:  Chronic Disease Management support and education needs related to HTN and DMII DMII- patient has not yet obtained semaglutide prescribed 07/07/20 during PCP office visit: will contact outpatient pharmacy HTN- patient has not yet obtained metoprolol prescribed 07/07/20 during PCP office visit: will contact outpatient pharmacy  RNCM Clinical Goal(s):  Patient will  verbalize understanding of plan for management of HTN and DMII through collaboration with RN Care manager, provider, and care team.  Interventions: 1:1 collaboration with primary care provider regarding development and update of comprehensive plan of care as evidenced by provider attestation and co-signature Inter-disciplinary care team collaboration (see longitudinal plan of care)  Diabetes:  (Status: New goal.) Lab Results  Component Value Date   HGBA1C 7.3 (H) 07/07/2020  Assessed patient's understanding of A1c goal: <7% Provided education to patient about basic DM disease process; Reviewed medications with patient and discussed importance of medication adherence; Discussed plans with patient for ongoing care management follow up and  provided patient with direct contact information for care management team; Reviewed scheduled/upcoming provider appointments including: 09/02/20- orthopedic provider; 01/06/21 PCP ; Advised patient, providing education and rationale, to check cbg 3 times per week, fasting and record, calling as needed for findings outside established parameters; Review of patient status, including review of consultants reports, relevant laboratory and other test results, and medications completed; Screening for signs and symptoms of depression related to chronic disease state;  Assessed social determinant of health barriers;  Confirmed patient follows  low carbohydrate, low sugar diet  Hypertension: (Status: New goal.) Last practice recorded BP readings:  BP Readings from Last 3 Encounters:  07/07/20 138/80  06/23/20 (!) 170/88  05/12/20 119/76  Most recent eGFR/CrCl: No results found for: EGFR  No components found for: CRCL  Evaluation of current treatment plan related to hypertension self management and patient's adherence to plan as established by provider; Reviewed medications with patient and discussed importance of compliance; Provided education on prescribed diet low salt, heart healthy;  Discussed complications of poorly controlled blood pressure such as heart disease, stroke, circulatory complications, vision complications, kidney impairment, sexual dysfunction;   Patient Goals/Self-Care Activities: Patient will self administer medications as prescribed Patient will attend all scheduled provider appointments Patient will call pharmacy for medication refills Patient will continue to perform ADL's independently Patient will continue to perform IADL's independently Patient will call provider office for new concerns or questions Patient will begin monitoring/ recording fasting  blood sugars at home  3 times per week and blood pressures once/ week      Plan: Telephone follow up appointment with  care management team member scheduled for:  Thursday 10/08/20 at 9:00 am The patient has been provided with contact information for the care management team and has been advised to call with any health related questions or concerns  [name], RN, BSN, [place] [phone]: direct office [phone]: mobile

## 2020-09-01 NOTE — Patient Instructions (Signed)
Visit [place], it was nice talking with you today.   I look forward to talking to you again for an update on Thursday October 08, 2020 at 9:00 am- please be listening out for my call that day.  I will call as close to 9:00 am as possible.  If you need to cancel or re-schedule our telephone visit, please call [phone] and one of our care guides will be happy to assist you.  I look forward to hearing about your progress.   Please don't hesitate to contact me if I can be of assistance to you before our next scheduled appointment.   [name], RN, BSN, [facility] RN Care Coordination- [place] [phone]: direct office [phone]: mobile   PATIENT GOALS:   Goals Addressed             This Visit's Progress    Patient Self Care Activities: DM/ HTN   On track    Timeframe:  Long-Range Goal Priority:  Medium Start Date:           09/01/20                  Expected End Date:     09/01/21  Patient will self administer medications as prescribed Patient will attend all scheduled provider appointments Patient will call pharmacy for medication refills Patient will continue to perform ADL's independently Patient will continue to perform IADL's independently Patient will call provider office for new concerns or questions Patient will begin monitoring/ recording fasting blood sugars at home                     Blood Glucose Monitoring, Adult Monitoring your blood sugar (glucose) is an important part of managing your diabetes. Blood glucose monitoring involves checking your blood glucose as often as directed and keeping a log orrecord of your results over time. Checking your blood glucose regularly and keeping a blood glucose log can: Help you and your health care provider adjust your diabetes management plan as needed, including your medicines or insulin. Help you understand how food, exercise, illnesses, and medicines affect your  blood glucose. Let you know what your blood glucose is at any time. You can quickly find out if you have low blood glucose (hypoglycemia) or high blood glucose (hyperglycemia). Your health care provider will set individualized treatment goals for you. Your goals will be based on your age, other medical conditions you have, and how you respond to diabetes treatment. Generally, the goal of treatment is to maintain the following blood glucose levels: Before meals (preprandial): 80-130 mg/dL (4.4-7.2 mmol/L). After meals (postprandial): below 180 mg/dL (10 mmol/L). A1C level: less than 7%. Supplies needed: Blood glucose meter. Test strips for your meter. Each meter has its own strips. You must use the strips that came with your meter. A needle to prick your finger (lancet). Do not use a lancet more than one time. A device that holds the lancet (lancing device). A journal or log book to write down your results. How to check your blood glucose Checking your blood glucose  Wash your hands for at least 20 seconds with soap and water. Prick the side of your finger (not the tip) with the lancet. Do not use the same finger consecutively. Gently rub the finger until a small drop of blood appears. Follow instructions that come with your meter for inserting the test strip, applying blood to the strip, and  using your blood glucose meter. Write down your result and any notes in your log.  Using alternative sites Some meters allow you to use areas of your body other than your finger (alternative sites) to test your blood. The most common alternative sites are the forearm, thethigh, and the palm of your hand. Alternative sites may not be as accurate as the fingers because blood flow is slower in those areas. This means that the result you get may be delayed, andit may be different from the result that you would get from your finger. Use the finger only, and do not use alternative sites, if: You think you have  hypoglycemia. You sometimes do not know that your blood glucose is getting low (hypoglycemia unawareness). General tips and recommendations Blood glucose log  Every time you check your blood glucose, write down your result. Also write down any notes about things that may be affecting your blood glucose, such as your diet and exercise for the day. This information can help you and your health care provider: Look for patterns in your blood glucose over time. Adjust your diabetes management plan as needed. Check if your meter allows you to download your records to a computer or if there is an app for the meter. Most glucose meters store a record of glucose readings in the meter.  If you have type 1 diabetes: Check your blood glucose 4 or more times a day if you are on intensive insulin therapy with multiple daily injections (MDI) or if you are using an insulin pump. Check your blood glucose: Before every meal and snack. Before bedtime. Also check your blood glucose: If you have symptoms of hypoglycemia. After treating low blood glucose. Before doing activities that create a risk for injury, like driving or using machinery. Before and after exercise. Two hours after a meal. Occasionally between 2:00 a.m. and 3:00 a.m., as directed. You may need to check your blood glucose more often, 6-10 times per day, if: You have diabetes that is not well controlled. You are ill. You have a history of severe hypoglycemia. You have hypoglycemia unawareness. If you have type 2 diabetes: Check your blood glucose 2 or more times a day if you take insulin or other diabetes medicines. Check your blood glucose 4 or more times a day if you are on intensive insulin therapy. Occasionally, you may also need to check your glucose between 2:00 a.m. and 3:00 a.m., as directed. Also check your blood glucose: Before and after exercise. Before doing activities that create a risk for injury, like driving or using  machinery. You may need to check your blood glucose more often if: Your medicine is being adjusted. Your diabetes is not well controlled. You are ill. General tips Make sure you always have your supplies with you. After you use a few boxes of test strips, adjust (calibrate) your blood glucose meter by following instructions that came with your meter. If you have questions or need help, all blood glucose meters have a 24-hour hotline phone number available that you can call. Also contact your health care provider with questions or concerns you may have. Where to find more information The American Diabetes Association: www.diabetes.org The Association of Diabetes Care & Education Specialists: www.diabeteseducator.org Contact a health care provider if: Your blood glucose is at or above 240 mg/dL (13.3 mmol/L) for 2 days in a row. You have been sick or have had a fever for 2 days or longer, and you are not getting better.  You have any of the following problems for more than 6 hours: You cannot eat or drink. You have nausea or vomiting. You have diarrhea. Get help right away if: Your blood glucose is lower than 54 mg/dL (3 mmol/L). You become confused, or you have trouble thinking clearly. You have difficulty breathing. You have moderate or large ketone levels in your urine. These symptoms may represent a serious problem that is an emergency. Do not wait to see if the symptoms will go away. Get medical help right away. Call your local emergency services (911 in the [place] Do not drive yourself to the hospital. Summary Monitoring your blood glucose is an important part of managing your diabetes. Blood glucose monitoring involves checking your blood glucose as often as directed and keeping a log or record of your results over time. Your health care provider will set individualized treatment goals for you. Your goals will be based on your age, other medical conditions you have, and how you  respond to diabetes treatment. Every time you check your blood glucose, write down your result. Also, write down any notes about things that may be affecting your blood glucose, such as your diet and exercise for the day. This information is not intended to replace advice given to you by your health care provider. Make sure you discuss any questions you have with your healthcare provider. Document Revised: 10/16/2019 Document Reviewed: 10/16/2019 Elsevier Patient Education  2022 [place].   Critical care medicine: Principles of diagnosis and management in the adult (4th ed., pp. 2633-3545). [employer]unders."> Miller's anesthesia (8th ed., pp. 232-250). [employer]unders.">  Advance Directive  Advance directives are legal documents that allow you to make decisions about your health care and medical treatment in case you become unable to communicate for yourself. Advance directives let your wishes be known to family, friends,and health care providers. Discussing and writing advance directives should happen over time rather than all at once. Advance directives can be changed and updated at any time. There are different types of advance directives, such as: Medical power of attorney. Living will. Do not resuscitate (DNR) order or do not attempt resuscitation (DNAR) order. Health care proxy and medical power of attorney A health care proxy is also called a health care agent. This person is appointed to make medical decisions for you when you are unable to make decisions for yourself. Generally, people ask a trusted friend or family member to act as their proxy and represent their preferences. Make sure you have an agreement with your trusted person to act as your proxy. A proxy may have tomake a medical decision on your behalf if your wishes are not known. A medical power of attorney, also called a durable power of attorney for health care, is a legal document that names your health care proxy. Depending on the  laws in your state, the document may need to be: Signed. Notarized. Dated. Copied. Witnessed. Incorporated into your medical record. You may also want to appoint a trusted person to manage your money in the event you are unable to do so. This is called a durable power of attorney for finances. It is a separate legal document from the durable power of attorney for health care. You may choose your health care proxy or someone different toact as your agent in money matters. If you do not appoint a proxy, or there is a concern that the proxy is not acting in your best interest, a court may appoint a guardian to act  on yourbehalf. Living will A living will is a set of instructions that state your wishes about medical care when you cannot express them yourself. Health care providers should keep a copy of your living will in your medical record. You may want to give a copy to family members or friends. To alert caregivers in case of an emergency, you can place a card in your wallet to let them know that you have a living will and where they can find it. A living will is used if you become: Terminally ill. Disabled. Unable to communicate or make decisions. The following decisions should be included in your living will: To use or not to use life support equipment, such as dialysis machines and breathing machines (ventilators). Whether you want a DNR or DNAR order. This tells health care providers not to use cardiopulmonary resuscitation (CPR) if breathing or heartbeat stops. To use or not to use tube feeding. To be given or not to be given food and fluids. Whether you want comfort (palliative) care when the goal becomes comfort rather than a cure. Whether you want to donate your organs and tissues. A living will does not give instructions for distributing your money andproperty if you should pass away. DNR or DNAR A DNR or DNAR order is a request not to have CPR in the event that your heart stops  beating or you stop breathing. If a DNR or DNAR order has not been made and shared, a health care provider will try to help any patient whose heart has stopped or who has stopped breathing. If you plan to have surgery, talk with your health care provider about how your DNR or DNAR order will be followed ifproblems occur. What if I do not have an advance directive? Some states assign family decision makers to act on your behalf if you do not have an advance directive. Each state has its own laws about advance directives. You may want to check with your health care provider, attorney, orstate representative about the laws in your state. Summary Advance directives are legal documents that allow you to make decisions about your health care and medical treatment in case you become unable to communicate for yourself. The process of discussing and writing advance directives should happen over time. You can change and update advance directives at any time. Advance directives may include a medical power of attorney, a living will, and a DNR or DNAR order. This information is not intended to replace advice given to you by your health care provider. Make sure you discuss any questions you have with your healthcare provider. Document Revised: 10/22/2019 Document Reviewed: 10/22/2019 Elsevier Patient Education  2022 [employer].  Consent to CCM Services: Ms. Vanorder was given information about Chronic Care Management services 08/06/20 including:  CCM service includes personalized support from designated clinical staff supervised by her physician, including individualized plan of care and coordination with other care providers 24/7 contact phone numbers for assistance for urgent and routine care needs. Service will only be billed when office clinical staff spend 20 minutes or more in a month to coordinate care. Only one practitioner may furnish and bill the service in a calendar month. The patient may stop CCM  services at any time (effective at the end of the month) by phone call to the office staff. The patient will be responsible for cost sharing (co-pay) of up to 20% of the service fee (after annual deductible is met).  Patient agreed to services and verbal  consent obtained.   The patient verbalized understanding of instructions, educational materials, and care plan provided today and agreed to receive a mailed copy of patient instructions, educational materials, and care plan.  Telephone follow up appointment with care management team member scheduled for:  Thursday October 08, 2020 at 9:00 am The patient has been provided with contact information for the care management team and has been advised to call with any health related questions or concerns.   [name], RN, BSN, [facility] RN Care Coordination- [place] [phone]: direct office [phone]: mobile   CLINICAL CARE PLAN: Patient Care Plan: RN Care Manager Plan of Care     Problem Identified: Chronic Disease Management Needs   Priority: Medium     Long-Range Goal: Development of plan of care for long term disease management   Start Date: 09/01/2020  Expected End Date: 09/01/2021  Priority: Medium  Note:   Current Barriers:  Chronic Disease Management support and education needs related to HTN and DMII DMII- patient has not yet obtained semaglutide prescribed 07/07/20 during PCP office visit: will contact outpatient pharmacy HTN- patient has not yet obtained metoprolol prescribed 07/07/20 during PCP office visit: will contact outpatient pharmacy  RNCM Clinical Goal(s):  Patient will  verbalize understanding of plan for management of HTN and DMII through collaboration with RN Care manager, provider, and care team.  Interventions: 1:1 collaboration with primary care provider regarding development and update of comprehensive plan of care as evidenced by provider attestation and  co-signature Inter-disciplinary care team collaboration (see longitudinal plan of care)  Diabetes:  (Status: New goal.) Lab Results  Component Value Date   HGBA1C 7.3 (H) 07/07/2020  Assessed patient's understanding of A1c goal: <7% Provided education to patient about basic DM disease process; Reviewed medications with patient and discussed importance of medication adherence; Discussed plans with patient for ongoing care management follow up and provided patient with direct contact information for care management team; Reviewed scheduled/upcoming provider appointments including: 09/02/20- orthopedic provider; 01/06/21 PCP ; Advised patient, providing education and rationale, to check cbg 3 times per week, fasting and record, calling as needed for findings outside established parameters; Review of patient status, including review of consultants reports, relevant laboratory and other test results, and medications completed; Screening for signs and symptoms of depression related to chronic disease state;  Assessed social determinant of health barriers;  Confirmed patient follows  low carbohydrate, low sugar diet  Hypertension: (Status: New goal.) Last practice recorded BP readings:  BP Readings from Last 3 Encounters:  07/07/20 138/80  06/23/20 (!) 170/88  05/12/20 119/76  Most recent eGFR/CrCl: No results found for: EGFR  No components found for: CRCL  Evaluation of current treatment plan related to hypertension self management and patient's adherence to plan as established by provider; Reviewed medications with patient and discussed importance of compliance; Provided education on prescribed diet low salt, heart healthy;  Discussed complications of poorly controlled blood pressure such as heart disease, stroke, circulatory complications, vision complications, kidney impairment, sexual dysfunction;   Patient Goals/Self-Care Activities: Patient will self administer medications as  prescribed Patient will attend all scheduled provider appointments Patient will call pharmacy for medication refills Patient will continue to perform ADL's independently Patient will continue to perform IADL's independently Patient will call provider office for new concerns or questions Patient will begin monitoring/ recording fasting blood sugars at home  3 times per week and blood pressures once/ week

## 2020-09-02 ENCOUNTER — Ambulatory Visit: Payer: PPO | Admitting: Orthopaedic Surgery

## 2020-09-23 ENCOUNTER — Ambulatory Visit: Payer: PPO | Admitting: Orthopaedic Surgery

## 2020-09-25 ENCOUNTER — Telehealth: Payer: Self-pay | Admitting: Orthopedic Surgery

## 2020-09-25 NOTE — Telephone Encounter (Signed)
Received vm from Estill Bamberg w/ [facility] stating she needs notes for DOS 02/02/20 & 06/23/20. IC her back [phone] advised we did not see patient on said dates. She asked that I send her email staing such. I emailed her [email or web address]

## 2020-09-27 ENCOUNTER — Encounter: Payer: Self-pay | Admitting: Emergency Medicine

## 2020-09-28 NOTE — Telephone Encounter (Signed)
Recommend virtual visit appointment.  Thanks.

## 2020-10-01 NOTE — Telephone Encounter (Signed)
Called and set up a mychart video appointment 10/07/20.

## 2020-10-07 ENCOUNTER — Telehealth (INDEPENDENT_AMBULATORY_CARE_PROVIDER_SITE_OTHER): Payer: PPO | Admitting: Emergency Medicine

## 2020-10-07 ENCOUNTER — Encounter: Payer: Self-pay | Admitting: Emergency Medicine

## 2020-10-07 DIAGNOSIS — J22 Unspecified acute lower respiratory infection: Secondary | ICD-10-CM | POA: Diagnosis not present

## 2020-10-07 DIAGNOSIS — R059 Cough, unspecified: Secondary | ICD-10-CM

## 2020-10-07 MED ORDER — BENZONATATE 200 MG PO CAPS: 200.0000 mg | ORAL_CAPSULE | Freq: Two times a day (BID) | ORAL | 0 refills | Status: DC | PRN

## 2020-10-07 MED ORDER — AZITHROMYCIN 250 MG PO TABS: ORAL_TABLET | ORAL | 0 refills | Status: DC

## 2020-10-07 MED ORDER — HYDROCODONE BIT-HOMATROP MBR 5-1.5 MG/5ML PO SOLN: 5.0000 mL | Freq: Every evening | ORAL | 0 refills | Status: DC | PRN

## 2020-10-07 NOTE — Progress Notes (Signed)
Telemedicine Encounter- SOAP NOTE Established Patient MyChart video encounter Patient: Home  Provider: Office   Patient present only  This video telephone encounter was conducted with the patient's (or proxy's) verbal consent via video audio telecommunications: yes/no: Yes Patient was instructed to have this encounter in a suitably private space; and to only have persons present to whom they give permission to participate. In addition, patient identity was confirmed by use of name plus two identifiers (DOB and address).  I discussed the limitations, risks, security and privacy concerns of performing an evaluation and management service by telephone and the availability of in person appointments. I also discussed with the patient that there may be a patient responsible charge related to this service. The patient expressed understanding and agreed to proceed.  I spent a total of TIME; 0 MIN TO 60 MIN: 20 minutes talking with the patient or their proxy.  Chief complaint: Persistent cough  Subjective   Erin Benitez is a 70 y.o. female established patient. Telephone visit today complaining of persistent cough for the past month.  At times productive of yellow phlegm.  Denies high fever or chills.  Able to eat and drink.  Denies nausea or vomiting.  Denies abdominal pain or diarrhea.  Denies difficulty breathing or chest pain.  Complains of occasional nasal congestion still with clear drainage.  Denies ear pain.  Denies loss of taste or smell.  Tested negative for COVID at home.  No other complaints or significant associated symptomatology.  HPI   Patient Active Problem List   Diagnosis Date Noted   Chronic left shoulder pain 07/07/2020   Dyslipidemia associated with type 2 diabetes mellitus ([place]) 09/06/2017   Diverticulosis of colon without hemorrhage 03/09/2015   Hyperlipidemia 03/09/2015   Gastroesophageal reflux disease without esophagitis 03/09/2015   Hypertension associated with  diabetes ([place]) 04/01/2013   Asthma 04/01/2013    Past Medical History:  Diagnosis Date   Allergy    Asthma    Bronchitis    Cataract    Diverticulosis    DM2 (diabetes mellitus, type 2) (HCC)    GERD (gastroesophageal reflux disease)    Heart murmur    Hyperplastic colon polyp    Hypertension     Current Outpatient Medications  Medication Sig Dispense Refill   albuterol (VENTOLIN HFA) 108 (90 Base) MCG/ACT inhaler Inhale 2 puffs into the lungs every 6 (six) hours as needed for wheezing (or cough). 1 each 0   amLODipine (NORVASC) 10 MG tablet Take 1 tablet (10 mg total) by mouth daily. 90 tablet 1   blood glucose meter kit and supplies Per insurance preference. Check glucose once a day, alternate between fasting and 2 hours after dinner. 1 each 5   Blood Pressure Monitor KIT 1 Units by Does not apply route daily. 1 each 0   chlorpheniramine-HYDROcodone (TUSSIONEX PENNKINETIC ER) 10-8 MG/5ML SUER Take 5 mLs by mouth every 12 (twelve) hours as needed for cough. (Patient not taking: Reported on 09/01/2020) 70 mL 0   esomeprazole (NEXIUM) 40 MG packet Take 40 mg by mouth daily before breakfast. 90 each 1   gabapentin (NEURONTIN) 300 MG capsule Take 1-2 capsules (300-600 mg total) by mouth at bedtime. (Patient not taking: Reported on 09/01/2020) 180 capsule 0   glipiZIDE (GLUCOTROL) 10 MG tablet Take 1 tablet (10 mg total) by mouth daily before breakfast. 90 tablet 1   metoprolol succinate (TOPROL-XL) 25 MG 24 hr tablet Take 1 tablet (25 mg total) by mouth daily. (Patient  not taking: Reported on 09/01/2020) 90 tablet 1   nortriptyline (PAMELOR) 25 MG capsule Take 1 capsule (25 mg total) by mouth at bedtime. (Patient not taking: Reported on 09/01/2020) 90 capsule 1   pioglitazone (ACTOS) 30 MG tablet Take 1 tablet (30 mg total) by mouth daily. 90 tablet 1   rosuvastatin (CRESTOR) 40 MG tablet Take 1 tablet (40 mg total) by mouth daily. 90 tablet 3   Semaglutide 7 MG TABS Take 7 mg by mouth daily.  (Patient not taking: Reported on 09/01/2020) 90 tablet 1   triamterene-hydrochlorothiazide (MAXZIDE-25) 37.5-25 MG tablet Take 1 tablet by mouth daily. 90 tablet 1   No current facility-administered medications for this visit.    Allergies  Allergen Reactions   Lisinopril Swelling   Peanut-Containing Drug Products Anaphylaxis, Hives, Swelling and Other (See Comments)    Pistachio's - swelling of the throat      Tramadol Nausea And Vomiting    Headache & dizziness with n&v    Social History   Socioeconomic History   Marital status: Single    Spouse name: Not on file   Number of children: 1   Years of education: Not on file   Highest education level: Not on file  Occupational History   Occupation: Educator  Tobacco Use   Smoking status: Never   Smokeless tobacco: Never  Vaping Use   Vaping Use: Never used  Substance and Sexual Activity   Alcohol use: No   Drug use: No   Sexual activity: Yes  Other Topics Concern   Not on file  Social History Narrative   She reports she is single. She has 1 daughter. 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     2 caffeinated beverages daily.   No alcohol tobacco or drug use.   Social Determinants of Health   Financial Resource Strain: Not on file  Food Insecurity: No Food Insecurity   Worried About Charity fundraiser in the Last Year: Never true   Ran Out of Food in the Last Year: Never true  Transportation Needs: No Transportation Needs   Lack of Transportation (Medical): No   Lack of Transportation (Non-Medical): No  Physical Activity: Not on file  Stress: Not on file  Social Connections: Not on file  Intimate Partner Violence: Not on file    Review of Systems  Constitutional: Negative.  Negative for chills and fever.  HENT:  Positive for congestion. Negative for sore  throat.   Respiratory:  Positive for cough. Negative for hemoptysis, sputum production and shortness of breath.   Cardiovascular: Negative.  Negative for chest pain and palpitations.  Gastrointestinal:  Negative for abdominal pain, diarrhea, nausea and vomiting.  Genitourinary: Negative.  Negative for dysuria and hematuria.  Skin: Negative.  Negative for rash.  Neurological: Negative.  Negative for dizziness and headaches.  All other systems reviewed and are negative.  Objective  Alert and oriented x3 in no apparent respiratory distress Vitals as reported by the patient: There were no vitals filed for this visit.  Diagnoses and all orders for this visit:  Cough -     benzonatate (TESSALON) 200 MG capsule; Take 1 capsule (200 mg total) by mouth 2 (two) times daily as needed for cough. -     HYDROcodone bit-homatropine (HYCODAN) 5-1.5 MG/5ML syrup; Take 5 mLs by  mouth at bedtime as needed for cough.  Lower respiratory infection -     azithromycin (ZITHROMAX) 250 MG tablet; Sig as indicated  Clinically stable.  No red flag signs or symptoms.  Persistent respiratory symptoms.  May have secondary bacterial infection. Will treat with azithromycin.  Cough treatment with Tessalon and Hycodan syrup.  Advised to stay well-hydrated and rest. May take over-the-counter Mucinex-DM as needed. ED precautions given. Advised to contact the office if no better or worse during the next several days.   I discussed the assessment and treatment plan with the patient. The patient was provided an opportunity to ask questions and all were answered. The patient agreed with the plan and demonstrated an understanding of the instructions.   The patient was advised to call back or seek an in-person evaluation if the symptoms worsen or if the condition fails to improve as anticipated.  I provided 20 minutes of non-face-to-face time during this encounter.  Horald Pollen, MD  Primary Care at [facility]

## 2020-10-08 ENCOUNTER — Ambulatory Visit (INDEPENDENT_AMBULATORY_CARE_PROVIDER_SITE_OTHER): Payer: PPO | Admitting: *Deleted

## 2020-10-08 DIAGNOSIS — E1159 Type 2 diabetes mellitus with other circulatory complications: Secondary | ICD-10-CM

## 2020-10-08 DIAGNOSIS — E119 Type 2 diabetes mellitus without complications: Secondary | ICD-10-CM

## 2020-10-08 DIAGNOSIS — E782 Mixed hyperlipidemia: Secondary | ICD-10-CM

## 2020-10-08 DIAGNOSIS — I152 Hypertension secondary to endocrine disorders: Secondary | ICD-10-CM

## 2020-10-08 NOTE — Patient Instructions (Addendum)
Visit [place], it was nice talking with you today.   I look forward to talking to you again for an update on Tuesday November 03, 2020 at 10:00 am- please be listening out for my call that day.  I will call as close to 10:00 am as possible.   If you need to cancel or re-schedule our telephone visit, please call [phone] and one of our care guides will be happy to assist you.   I look forward to hearing about your progress.   Please don't hesitate to contact me if I can be of assistance to you before our next scheduled telephone appointment.   [name], RN, BSN, [facility] RN Care Coordination- [place] [phone]: direct office [phone]: mobile   PATIENT GOALS:  Goals Addressed             This Visit's Progress    Patient Self Care Activities: DM/ HTN   On track    Timeframe:  Long-Range Goal Priority:  Medium Start Date:           09/01/20                  Expected End Date:     09/01/21  Patient will self administer medications as prescribed Patient will attend all scheduled provider appointments Patient will call pharmacy for medication refills Patient will continue to perform ADL's independently Patient will continue to perform IADL's independently Patient will call provider office for new concerns or questions Patient will begin monitoring/ writing down on paper blood sugars at home               Patient will continue to monitor and write down blood pressures at home       Blood Glucose Monitoring, Adult Monitoring your blood sugar (glucose) is an important part of managing your diabetes. Blood glucose monitoring involves checking your blood glucose as often as directed and keeping a log or record of your results over time. Checking your blood glucose regularly and keeping a blood glucose log can: Help you and your health care provider adjust your diabetes management plan as needed, including your medicines or  insulin. Help you understand how food, exercise, illnesses, and medicines affect your blood glucose. Let you know what your blood glucose is at any time. You can quickly find out if you have low blood glucose (hypoglycemia) or high blood glucose (hyperglycemia). Your health care provider will set individualized treatment goals for you. Your goals will be based on your age, other medical conditions you have, and how you respond to diabetes treatment. Generally, the goal of treatment is to maintain the following blood glucose levels: Before meals (preprandial): 80-130 mg/dL (4.4-7.2 mmol/L). After meals (postprandial): below 180 mg/dL (10 mmol/L). A1C level: less than 7%. Supplies needed: Blood glucose meter. Test strips for your meter. Each meter has its own strips. You must use the strips that came with your meter. A needle to prick your finger (lancet). Do not use a lancet more than one time. A device that holds the lancet (lancing device). A journal or log book to write down your results. How to check your blood glucose Checking your blood glucose  Wash your hands for at least 20 seconds with soap and water. Prick the side of your finger (not the tip) with the lancet. Do not use the same finger consecutively. Gently rub the finger until a small drop of blood appears. Follow instructions  that come with your meter for inserting the test strip, applying blood to the strip, and using your blood glucose meter. Write down your result and any notes in your log. Using alternative sites Some meters allow you to use areas of your body other than your finger (alternative sites) to test your blood. The most common alternative sites are the forearm, the thigh, and the palm of your hand. Alternative sites may not be as accurate as the fingers because blood flow is slower in those areas. This means that the result you get may be delayed, and it may be different from the result that you would get from your  finger. Use the finger only, and do not use alternative sites, if: You think you have hypoglycemia. You sometimes do not know that your blood glucose is getting low (hypoglycemia unawareness). General tips and recommendations Blood glucose log  Every time you check your blood glucose, write down your result. Also write down any notes about things that may be affecting your blood glucose, such as your diet and exercise for the day. This information can help you and your health care provider: Look for patterns in your blood glucose over time. Adjust your diabetes management plan as needed. Check if your meter allows you to download your records to a computer or if there is an app for the meter. Most glucose meters store a record of glucose readings in the meter. If you have type 1 diabetes: Check your blood glucose 4 or more times a day if you are on intensive insulin therapy with multiple daily injections (MDI) or if you are using an insulin pump. Check your blood glucose: Before every meal and snack. Before bedtime. Also check your blood glucose: If you have symptoms of hypoglycemia. After treating low blood glucose. Before doing activities that create a risk for injury, like driving or using machinery. Before and after exercise. Two hours after a meal. Occasionally between 2:00 a.m. and 3:00 a.m., as directed. You may need to check your blood glucose more often, 6-10 times per day, if: You have diabetes that is not well controlled. You are ill. You have a history of severe hypoglycemia. You have hypoglycemia unawareness. If you have type 2 diabetes: Check your blood glucose 2 or more times a day if you take insulin or other diabetes medicines. Check your blood glucose 4 or more times a day if you are on intensive insulin therapy. Occasionally, you may also need to check your glucose between 2:00 a.m. and 3:00 a.m., as directed. Also check your blood glucose: Before and after  exercise. Before doing activities that create a risk for injury, like driving or using machinery. You may need to check your blood glucose more often if: Your medicine is being adjusted. Your diabetes is not well controlled. You are ill. General tips Make sure you always have your supplies with you. After you use a few boxes of test strips, adjust (calibrate) your blood glucose meter by following instructions that came with your meter. If you have questions or need help, all blood glucose meters have a 24-hour hotline phone number available that you can call. Also contact your health care provider with questions or concerns you may have. Where to find more information The American Diabetes Association: www.diabetes.org The Association of Diabetes Care & Education Specialists: www.diabeteseducator.org Contact a health care provider if: Your blood glucose is at or above 240 mg/dL (13.3 mmol/L) for 2 days in a row. You have been sick  or have had a fever for 2 days or longer, and you are not getting better. You have any of the following problems for more than 6 hours: You cannot eat or drink. You have nausea or vomiting. You have diarrhea. Get help right away if: Your blood glucose is lower than 54 mg/dL (3 mmol/L). You become confused, or you have trouble thinking clearly. You have difficulty breathing. You have moderate or large ketone levels in your urine. These symptoms may represent a serious problem that is an emergency. Do not wait to see if the symptoms will go away. Get medical help right away. Call your local emergency services (911 in the [place] Do not drive yourself to the hospital. Summary Monitoring your blood glucose is an important part of managing your diabetes. Blood glucose monitoring involves checking your blood glucose as often as directed and keeping a log or record of your results over time. Your health care provider will set individualized treatment goals for you.  Your goals will be based on your age, other medical conditions you have, and how you respond to diabetes treatment. Every time you check your blood glucose, write down your result. Also, write down any notes about things that may be affecting your blood glucose, such as your diet and exercise for the day. This information is not intended to replace advice given to you by your health care provider. Make sure you discuss any questions you have with your health care provider. Document Revised: 10/16/2019 Document Reviewed: 10/16/2019 Elsevier Patient Education  2022 [place] Directive Advance directives are legal documents that allow you to make decisions about your health care and medical treatment in case you become unable to communicate for yourself. Advance directives let your wishes be known to family, friends, and health care providers. Discussing and writing advance directives should happen over time rather than all at once. Advance directives can be changed and updated at any time. There are different types of advance directives, such as: Medical power of attorney. Living will. Do not resuscitate (DNR) order or do not attempt resuscitation (DNAR) order. Health care proxy and medical power of attorney A health care proxy is also called a health care agent. This person is appointed to make medical decisions for you when you are unable to make decisions for yourself. Generally, people ask a trusted friend or family member to act as their proxy and represent their preferences. Make sure you have an agreement with your trusted person to act as your proxy. A proxy may have to make a medical decision on your behalf if your wishes are not known. A medical power of attorney, also called a durable power of attorney for health care, is a legal document that names your health care proxy. Depending on the laws in your state, the document may need to  be: Signed. Notarized. Dated. Copied. Witnessed. Incorporated into your medical record. You may also want to appoint a trusted person to manage your money in the event you are unable to do so. This is called a durable power of attorney for finances. It is a separate legal document from the durable power of attorney for health care. You may choose your health care proxy or someone different to act as your agent in money matters. If you do not appoint a proxy, or there is a concern that the proxy is not acting in your best interest, a court may appoint a guardian to act on your behalf. Living will  A living will is a set of instructions that state your wishes about medical care when you cannot express them yourself. Health care providers should keep a copy of your living will in your medical record. You may want to give a copy to family members or friends. To alert caregivers in case of an emergency, you can place a card in your wallet to let them know that you have a living will and where they can find it. A living will is used if you become: Terminally ill. Disabled. Unable to communicate or make decisions. The following decisions should be included in your living will: To use or not to use life support equipment, such as dialysis machines and breathing machines (ventilators). Whether you want a DNR or DNAR order. This tells health care providers not to use cardiopulmonary resuscitation (CPR) if breathing or heartbeat stops. To use or not to use tube feeding. To be given or not to be given food and fluids. Whether you want comfort (palliative) care when the goal becomes comfort rather than a cure. Whether you want to donate your organs and tissues. A living will does not give instructions for distributing your money and property if you should pass away. DNR or DNAR A DNR or DNAR order is a request not to have CPR in the event that your heart stops beating or you stop breathing. If a DNR or DNAR  order has not been made and shared, a health care provider will try to help any patient whose heart has stopped or who has stopped breathing. If you plan to have surgery, talk with your health care provider about how your DNR or DNAR order will be followed if problems occur. What if I do not have an advance directive? Some states assign family decision makers to act on your behalf if you do not have an advance directive. Each state has its own laws about advance directives. You may want to check with your health care provider, attorney, or state representative about the laws in your state. Summary Advance directives are legal documents that allow you to make decisions about your health care and medical treatment in case you become unable to communicate for yourself. The process of discussing and writing advance directives should happen over time. You can change and update advance directives at any time. Advance directives may include a medical power of attorney, a living will, and a DNR or DNAR order. This information is not intended to replace advice given to you by your health care provider. Make sure you discuss any questions you have with your health care provider. Document Revised: 10/22/2019 Document Reviewed: 10/22/2019 Elsevier Patient Education  [place].   The patient verbalized understanding of instructions, educational materials, and care plan provided today and agreed to receive a mailed copy of patient instructions, educational materials, and care plan Telephone follow up appointment with care management team member scheduled for:  Tuesday November 03, 2020 at 10:00 am The patient has been provided with contact information for the care management team and has been advised to call with any health related questions or concerns  [name], RN, BSN, [place] [phone]: direct office [phone]: mobile

## 2020-10-08 NOTE — Chronic Care Management (AMB) (Signed)
Chronic Care Management   CCM RN Visit Note  10/08/2020 Name: Erin Benitez MRN: [id number] DOB: 1950/07/11  Subjective: Erin Benitez is a 70 y.o. year old female who is a primary care patient of Sagardia, Ines Bloomer, MD. The care management team was consulted for assistance with disease management and care coordination needs.    Engaged with patient by telephone for follow up visit in response to provider referral for case management and/or care coordination services.   Consent to Services:  The patient was given information about Chronic Care Management services, agreed to services, and gave verbal consent prior to initiation of services.  Please see initial visit note for detailed documentation.  Patient agreed to services and verbal consent obtained.   Assessment: Review of patient past medical history, allergies, medications, health status, including review of consultants reports, laboratory and other test data, was performed as part of comprehensive evaluation and provision of chronic care management services.    CCM Care Plan Allergies  Allergen Reactions   Lisinopril Swelling   Peanut-Containing Drug Products Anaphylaxis, Hives, Swelling and Other (See Comments)    Pistachio's - swelling of the throat      Tramadol Nausea And Vomiting    Headache & dizziness with n&v   Outpatient Encounter Medications as of 10/08/2020  Medication Sig Note   albuterol (VENTOLIN HFA) 108 (90 Base) MCG/ACT inhaler Inhale 2 puffs into the lungs every 6 (six) hours as needed for wheezing (or cough). 09/01/2020: 09/01/20: Reports has not needed recently   amLODipine (NORVASC) 10 MG tablet Take 1 tablet (10 mg total) by mouth daily.    azithromycin (ZITHROMAX) 250 MG tablet Sig as indicated    benzonatate (TESSALON) 200 MG capsule Take 1 capsule (200 mg total) by mouth 2 (two) times daily as needed for cough.    blood glucose meter kit and supplies Per insurance preference. Check glucose once a day,  alternate between fasting and 2 hours after dinner.    Blood Pressure Monitor KIT 1 Units by Does not apply route daily.    esomeprazole (NEXIUM) 40 MG packet Take 40 mg by mouth daily before breakfast.    gabapentin (NEURONTIN) 300 MG capsule Take 1-2 capsules (300-600 mg total) by mouth at bedtime. (Patient not taking: Reported on 09/01/2020) 09/01/2020: 09/01/20: Patient reports currently not taking; she is considering re-starting   glipiZIDE (GLUCOTROL) 10 MG tablet Take 1 tablet (10 mg total) by mouth daily before breakfast.    HYDROcodone bit-homatropine (HYCODAN) 5-1.5 MG/5ML syrup Take 5 mLs by mouth at bedtime as needed for cough.    metoprolol succinate (TOPROL-XL) 25 MG 24 hr tablet Take 1 tablet (25 mg total) by mouth daily. (Patient not taking: Reported on 09/01/2020) 09/01/2020: 09/01/20: Reports has not yet picked up from pharmacy- to follow up with outpatient pharmacy to obtain   nortriptyline (PAMELOR) 25 MG capsule Take 1 capsule (25 mg total) by mouth at bedtime. (Patient not taking: Reported on 09/01/2020) 09/01/2020: 09/01/20: Reports has not needed/ taken recently   pioglitazone (ACTOS) 30 MG tablet Take 1 tablet (30 mg total) by mouth daily.    rosuvastatin (CRESTOR) 40 MG tablet Take 1 tablet (40 mg total) by mouth daily.    Semaglutide 7 MG TABS Take 7 mg by mouth daily. (Patient not taking: Reported on 09/01/2020) 09/01/2020: 09/01/20: Patient reports has not yet started-- to call outpatient pharmacy to follow up   triamterene-hydrochlorothiazide (MAXZIDE-25) 37.5-25 MG tablet Take 1 tablet by mouth daily.    No facility-administered  encounter medications on file as of 10/08/2020.   Patient Active Problem List   Diagnosis Date Noted   Chronic left shoulder pain 07/07/2020   Dyslipidemia associated with type 2 diabetes mellitus ([place]) 09/06/2017   Diverticulosis of colon without hemorrhage 03/09/2015   Hyperlipidemia 03/09/2015   Gastroesophageal reflux disease without esophagitis 03/09/2015    Hypertension associated with diabetes ([place]) 04/01/2013   Asthma 04/01/2013   Conditions to be addressed/monitored:  HTN and DMII  Care Plan : RN Care Manager Plan of Care  Updates made by Knox Royalty, RN since 10/08/2020 12:00 AM     Problem: Chronic Disease Management Needs   Priority: Medium     Long-Range Goal: Development of plan of care for long term disease management   Start Date: 09/01/2020  Expected End Date: 09/01/2021  Priority: Medium  Note:   Current Barriers:  Chronic Disease Management support and education needs related to HTN and DMII DMII- patient has not yet obtained semaglutide prescribed 07/07/20 during PCP office visit: will contact outpatient pharmacy- confirmed 10/08/20 patient obtained/ is taking as prescribed HTN- patient has not yet obtained metoprolol prescribed 07/07/20 during PCP office visit: will contact outpatient pharmacy- confirmed 10/08/20 patient obtained/ is taking as prescribed  RNCM Clinical Goal(s):  Patient will  verbalize understanding of plan for management of HTN and DMII through collaboration with RN Care manager, provider, and care team.  Interventions: 1:1 collaboration with primary care provider regarding development and update of comprehensive plan of care as evidenced by provider attestation and co-signature Inter-disciplinary care team collaboration (see longitudinal plan of care)  Diabetes:  (Status: Goal on track: YES.) Lab Results  Component Value Date   HGBA1C 7.3 (H) 07/07/2020  Assessed patient's understanding of A1c goal: between 6-7;  <7% Provided education to patient about basic DM disease process; Discussed plans with patient for ongoing care management follow up and provided patient with direct contact information for care management team;      Reviewed scheduled/upcoming provider appointments including: 10/21/20- orthopedic provider; 01/06/21 PCP ;         Advised patient, providing education and rationale, to check cbg 3  times per week, fasting and record        Review of patient status, including review of consultants reports, relevant laboratory and other test results, and medications completed;      low carbohydrate, low sugar diet Reviewed with patient recently recorded blood sugars at home: she is reporting values between "7.3 and 7.7;" questioned patient as to whether she is monitoring blood sugars or A1-C values at home- she is unsure; states she has been monitoring intermittently, not on a regular basis, does not like to prick finger; discussed option of having her adult daughter assist with this when she visits patient, as patient reports she assisted in the past: patient will do Discussed with patient her individualized A1-C trends over last 12 months, along with correlation to blood sugar averages over time; will provide additional printed education Reiterated previously provided education around complications/ cardiovascular risks of DMII in concurrent setting of HTN/ HLD Provided education around signs/ symptoms/ corresponding action plan for development of low blood sugar- Confirmed no signs/ symptoms of same Reviewed yesterday's video visit with PCP for newly developed cough, without other symptoms: confirmed home test negative for covid; patient verbalizes plans to obtain newly prescribed medications today; states cough is annoying, but otherwise she "feels okay;" 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 Confirmed patient continues  following low carb, low sugar diet  Hypertension: (Status: Goal on track: YES.) Last practice recorded BP readings:  BP Readings from Last 3 Encounters:  07/07/20 138/80  06/23/20 (!) 170/88  05/12/20 119/76  Most recent eGFR/CrCl: No results found for: EGFR  No components found for: CRCL  Evaluation of current treatment plan related to hypertension self management and patient's adherence to plan as established by provider;   Provided education on  prescribed diet low salt, heart healthy;  Discussed complications of poorly controlled blood pressure such as heart disease, stroke, circulatory complications, vision complications, kidney impairment, sexual dysfunction;  Reviewed recently recorded blood pressures at home with patient: she reports general ranges between 130-140/ 85-90 Confirmed patient has recently moved and has not yet received previously mailed printed education around Advanced Directive planning, self-health management of DM: will place in mail again today to new stated address  Patient Goals/Self-Care Activities: Patient will self administer medications as prescribed Patient will attend all scheduled provider appointments Patient will call pharmacy for medication refills Patient will continue to perform ADL's independently Patient will continue to perform IADL's independently Patient will call provider office for new concerns or questions Patient will begin monitoring/ writing down on paper blood sugars at home               Patient will continue to monitor and write down blood pressures at home         Plan: Telephone follow up appointment with care management team member scheduled for:  Tuesday November 03, 2020 at 10:00 am The patient has been provided with contact information for the care management team and has been advised to call with any health related questions or concerns  [name], RN, BSN, [place] [phone]: direct office [phone]: mobile

## 2020-10-21 ENCOUNTER — Other Ambulatory Visit: Payer: Self-pay

## 2020-10-21 ENCOUNTER — Ambulatory Visit: Payer: PPO | Admitting: Orthopaedic Surgery

## 2020-10-21 ENCOUNTER — Encounter: Payer: Self-pay | Admitting: Orthopaedic Surgery

## 2020-10-21 DIAGNOSIS — M4802 Spinal stenosis, cervical region: Secondary | ICD-10-CM | POA: Diagnosis not present

## 2020-10-21 DIAGNOSIS — M5020 Other cervical disc displacement, unspecified cervical region: Secondary | ICD-10-CM
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
## 2020-10-21 NOTE — Addendum Note (Signed)
Addended by: Meyer Cory on: 10/21/2020 09:08 AM   Modules accepted: Orders

## 2020-10-21 NOTE — Addendum Note (Signed)
Addended by: Marybelle Killings on: 10/21/2020 09:04 AM   Modules accepted: Orders

## 2020-10-30 DIAGNOSIS — E1159 Type 2 diabetes mellitus with other circulatory complications: Secondary | ICD-10-CM

## 2020-10-30 DIAGNOSIS — I152 Hypertension secondary to endocrine disorders: Secondary | ICD-10-CM | POA: Diagnosis not present

## 2020-10-30 DIAGNOSIS — E119 Type 2 diabetes mellitus without complications: Secondary | ICD-10-CM

## 2020-10-30 DIAGNOSIS — E782 Mixed hyperlipidemia: Secondary | ICD-10-CM | POA: Diagnosis not present

## 2020-11-02 ENCOUNTER — Ambulatory Visit (INDEPENDENT_AMBULATORY_CARE_PROVIDER_SITE_OTHER): Payer: PPO

## 2020-11-02 DIAGNOSIS — Z0000 Encounter for general adult medical examination without abnormal findings: Secondary | ICD-10-CM

## 2020-11-02 NOTE — Patient Instructions (Signed)
Ms. Vanstone , Thank you for taking time to come for your Medicare Wellness Visit. I appreciate your ongoing commitment to your health goals. Please review the following plan we discussed and let me know if I can assist you in the future.   Screening recommendations/referrals: Colonoscopy: 06/21/2019; due every 5 years Mammogram: 10/28/2019; due every 2 years Bone Density: 01/08/2019; due every 2 years Recommended yearly ophthalmology/optometry visit for glaucoma screening and checkup Recommended yearly dental visit for hygiene and checkup  Vaccinations: Influenza vaccine: 10/31/2019; due Fall 2022 Pneumococcal vaccine: 07/08/2017, 08/21/2018 Tdap vaccine: 07/08/2017; due every 10 years Shingles vaccine: 07/07/2020; need second dose   Covid-19: 04/20/2019, 05/04/2019  Advanced directives: Advance directive discussed with you today. I have provided a copy for you to complete at home and have notarized. Once this is complete please bring a copy in to our office so we can scan it into your chart.  Conditions/risks identified: Yes; Client understands the importance of follow-up with providers by attending scheduled visits and discussed goals to eat healthier, increase physical activity, exercise the brain, socialize more, get enough sleep and make time for laughter.  Next appointment: Please schedule your next Medicare Wellness Visit with your Nurse Health Advisor in 1 year by calling [phone].   Preventive Care 70 Years and Older, Female Preventive care refers to lifestyle choices and visits with your health care provider that can promote health and wellness. What does preventive care include? A yearly physical exam. This is also called an annual well check. Dental exams once or twice a year. Routine eye exams. Ask your health care provider how often you should have your eyes checked. Personal lifestyle choices, including: Daily care of your teeth and gums. Regular physical activity. Eating a  healthy diet. Avoiding tobacco and drug use. Limiting alcohol use. Practicing safe sex. Taking low-dose aspirin every day. Taking vitamin and mineral supplements as recommended by your health care provider. What happens during an annual well check? The services and screenings done by your health care provider during your annual well check will depend on your age, overall health, lifestyle risk factors, and family history of disease. Counseling  Your health care provider may ask you questions about your: Alcohol use. Tobacco use. Drug use. Emotional well-being. Home and relationship well-being. Sexual activity. Eating habits. History of falls. Memory and ability to understand (cognition). Work and work Statistician. Reproductive health. Screening  You may have the following tests or measurements: Height, weight, and BMI. Blood pressure. Lipid and cholesterol levels. These may be checked every 5 years, or more frequently if you are over 42 years old. Skin check. Lung cancer screening. You may have this screening every year starting at age 70 if you have a 30-pack-year history of smoking and currently smoke or have quit within the past 15 years. Fecal occult blood test (FOBT) of the stool. You may have this test every year starting at age 70. Flexible sigmoidoscopy or colonoscopy. You may have a sigmoidoscopy every 5 years or a colonoscopy every 10 years starting at age 70. Hepatitis C blood test. Hepatitis B blood test. Sexually transmitted disease (STD) testing. Diabetes screening. This is done by checking your blood sugar (glucose) after you have not eaten for a while (fasting). You may have this done every 1-3 years. Bone density scan. This is done to screen for osteoporosis. You may have this done starting at age 70. Mammogram. This may be done every 1-2 years. Talk to your health care provider about how often  you should have regular mammograms. Talk with your health care  provider about your test results, treatment options, and if necessary, the need for more tests. Vaccines  Your health care provider may recommend certain vaccines, such as: Influenza vaccine. This is recommended every year. Tetanus, diphtheria, and acellular pertussis (Tdap, Td) vaccine. You may need a Td booster every 10 years. Zoster vaccine. You may need this after age 70. Pneumococcal 13-valent conjugate (PCV13) vaccine. One dose is recommended after age 70. Pneumococcal polysaccharide (PPSV23) vaccine. One dose is recommended after age 70. Talk to your health care provider about which screenings and vaccines you need and how often you need them. This information is not intended to replace advice given to you by your health care provider. Make sure you discuss any questions you have with your health care provider. Document Released: 02/13/2015 Document Revised: 10/07/2015 Document Reviewed: 11/18/2014 Elsevier Interactive Patient Education  2017 [employer] Prevention in the Home Falls can cause injuries. They can happen to people of all ages. There are many things you can do to make your home safe and to help prevent falls. What can I do on the outside of my home? Regularly fix the edges of walkways and driveways and fix any cracks. Remove anything that might make you trip as you walk through a door, such as a raised step or threshold. Trim any bushes or trees on the path to your home. Use bright outdoor lighting. Clear any walking paths of anything that might make someone trip, such as rocks or tools. Regularly check to see if handrails are loose or broken. Make sure that both sides of any steps have handrails. Any raised decks and porches should have guardrails on the edges. Have any leaves, snow, or ice cleared regularly. Use sand or salt on walking paths during winter. Clean up any spills in your garage right away. This includes oil or grease spills. What can I do in the  bathroom? Use night lights. Install grab bars by the toilet and in the tub and shower. Do not use towel bars as grab bars. Use non-skid mats or decals in the tub or shower. If you need to sit down in the shower, use a plastic, non-slip stool. Keep the floor dry. Clean up any water that spills on the floor as soon as it happens. Remove soap buildup in the tub or shower regularly. Attach bath mats securely with double-sided non-slip rug tape. Do not have throw rugs and other things on the floor that can make you trip. What can I do in the bedroom? Use night lights. Make sure that you have a light by your bed that is easy to reach. Do not use any sheets or blankets that are too big for your bed. They should not hang down onto the floor. Have a firm chair that has side arms. You can use this for support while you get dressed. Do not have throw rugs and other things on the floor that can make you trip. What can I do in the kitchen? Clean up any spills right away. Avoid walking on wet floors. Keep items that you use a lot in easy-to-reach places. If you need to reach something above you, use a strong step stool that has a grab bar. Keep electrical cords out of the way. Do not use floor polish or wax that makes floors slippery. If you must use wax, use non-skid floor wax. Do not have throw rugs and other things on  the floor that can make you trip. What can I do with my stairs? Do not leave any items on the stairs. Make sure that there are handrails on both sides of the stairs and use them. Fix handrails that are broken or loose. Make sure that handrails are as long as the stairways. Check any carpeting to make sure that it is firmly attached to the stairs. Fix any carpet that is loose or worn. Avoid having throw rugs at the top or bottom of the stairs. If you do have throw rugs, attach them to the floor with carpet tape. Make sure that you have a light switch at the top of the stairs and the  bottom of the stairs. If you do not have them, ask someone to add them for you. What else can I do to help prevent falls? Wear shoes that: Do not have high heels. Have rubber bottoms. Are comfortable and fit you well. Are closed at the toe. Do not wear sandals. If you use a stepladder: Make sure that it is fully opened. Do not climb a closed stepladder. Make sure that both sides of the stepladder are locked into place. Ask someone to hold it for you, if possible. Clearly mark and make sure that you can see: Any grab bars or handrails. First and last steps. Where the edge of each step is. Use tools that help you move around (mobility aids) if they are needed. These include: Canes. Walkers. Scooters. Crutches. Turn on the lights when you go into a dark area. Replace any light bulbs as soon as they burn out. Set up your furniture so you have a clear path. Avoid moving your furniture around. If any of your floors are uneven, fix them. If there are any pets around you, be aware of where they are. Review your medicines with your doctor. Some medicines can make you feel dizzy. This can increase your chance of falling. Ask your doctor what other things that you can do to help prevent falls. This information is not intended to replace advice given to you by your health care provider. Make sure you discuss any questions you have with your health care provider. Document Released: 11/13/2008 Document Revised: 06/25/2015 Document Reviewed: 02/21/2014 Elsevier Interactive Patient Education  2017 [employer].

## 2020-11-02 NOTE — Progress Notes (Signed)
I connected with Erin Benitez today by telephone and verified that I am speaking with the correct person using two identifiers. Location patient: home Location provider: work Persons participating in the virtual visit: patient, provider.   I discussed the limitations, risks, security and privacy concerns of performing an evaluation and management service by telephone and the availability of in person appointments. I also discussed with the patient that there may be a patient responsible charge related to this service. The patient expressed understanding and verbally consented to this telephonic visit.    Interactive audio and video telecommunications were attempted between this provider and patient, however failed, due to patient having technical difficulties OR patient did not have access to video capability.  We continued and completed visit with audio only.  Some vital signs may be absent or patient reported.   Time Spent with patient on telephone encounter: 40 minutes  Subjective:   Erin Benitez is a 70 y.o. female who presents for Medicare Annual (Subsequent) preventive examination.  Review of Systems     Cardiac Risk Factors include: advanced age (>13men, >55 women);diabetes mellitus;dyslipidemia;family history of premature cardiovascular disease;hypertension     Objective:    There were no vitals filed for this visit. There is no height or weight on file to calculate BMI.  Advanced Directives 11/02/2020 09/01/2020 07/23/2019 07/18/2018 03/20/2016  Does Patient Have a Medical Advance Directive? No No No No No  Does patient want to make changes to medical advance directive? - - Yes (ED - Information included in AVS) - -  Would patient like information on creating a medical advance directive? Yes (MAU/Ambulatory/Procedural Areas - Information given) Yes (MAU/Ambulatory/Procedural Areas - Information given) No - Patient declined No - Patient declined No - Patient declined    Current  Medications (verified) Outpatient Encounter Medications as of 11/02/2020  Medication Sig   albuterol (VENTOLIN HFA) 108 (90 Base) MCG/ACT inhaler Inhale 2 puffs into the lungs every 6 (six) hours as needed for wheezing (or cough).   amLODipine (NORVASC) 10 MG tablet Take 1 tablet (10 mg total) by mouth daily.   azithromycin (ZITHROMAX) 250 MG tablet Sig as indicated   benzonatate (TESSALON) 200 MG capsule Take 1 capsule (200 mg total) by mouth 2 (two) times daily as needed for cough.   esomeprazole (NEXIUM) 40 MG packet Take 40 mg by mouth daily before breakfast.   gabapentin (NEURONTIN) 300 MG capsule Take 1-2 capsules (300-600 mg total) by mouth at bedtime.   glipiZIDE (GLUCOTROL) 10 MG tablet Take 1 tablet (10 mg total) by mouth daily before breakfast.   HYDROcodone bit-homatropine (HYCODAN) 5-1.5 MG/5ML syrup Take 5 mLs by mouth at bedtime as needed for cough.   metoprolol succinate (TOPROL-XL) 25 MG 24 hr tablet Take 1 tablet (25 mg total) by mouth daily.   nortriptyline (PAMELOR) 25 MG capsule Take 1 capsule (25 mg total) by mouth at bedtime.   pioglitazone (ACTOS) 30 MG tablet Take 1 tablet (30 mg total) by mouth daily.   rosuvastatin (CRESTOR) 40 MG tablet Take 1 tablet (40 mg total) by mouth daily.   Semaglutide 7 MG TABS Take 7 mg by mouth daily.   triamterene-hydrochlorothiazide (MAXZIDE-25) 37.5-25 MG tablet Take 1 tablet by mouth daily.   No facility-administered encounter medications on file as of 11/02/2020.    Allergies (verified) Lisinopril, Peanut-containing drug products, and Tramadol   History: Past Medical History:  Diagnosis Date   Allergy    Asthma    Bronchitis    Cataract  Diverticulosis    DM2 (diabetes mellitus, type 2) (HCC)    GERD (gastroesophageal reflux disease)    Heart murmur    Hyperplastic colon polyp    Hypertension    Past Surgical History:  Procedure Laterality Date   CESAREAN SECTION     COLONOSCOPY     ESOPHAGOGASTRODUODENOSCOPY      TONSILLECTOMY     Family History  Problem Relation Age of Onset   Cancer Mother        ovarian cancer   Heart disease Mother    Liver disease Mother    Hyperlipidemia Mother    Stroke Mother    Cancer Father        lung cancer   Cancer Sister    Hyperlipidemia Sister    Cancer Sister    Hyperlipidemia Sister    Diabetes Maternal Aunt    Colon cancer Maternal Aunt        x2 aunts   Colon cancer Maternal Uncle        2 uncles   Colon cancer Paternal Grandfather    Esophageal cancer Neg Hx    Rectal cancer Neg Hx    Stomach cancer Neg Hx    Social History   Socioeconomic History   Marital status: Single    Spouse name: Not on file   Number of children: 1   Years of education: Not on file   Highest education level: Not on file  Occupational History   Occupation: Educator  Tobacco Use   Smoking status: Never   Smokeless tobacco: Never  Vaping Use   Vaping Use: Never used  Substance and Sexual Activity   Alcohol use: No   Drug use: No   Sexual activity: Yes  Other Topics Concern   Not on file  Social History Narrative   She reports she is single. She has 1 daughter. 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     2 caffeinated beverages daily.   No alcohol tobacco or drug use.   Social Determinants of Health   Financial Resource Strain: Low Risk    Difficulty of Paying Living Expenses: Not hard at all  Food Insecurity: No Food Insecurity   Worried About Charity fundraiser in the Last Year: Never true   [place] in the Last Year: Never true  Transportation Needs: No Transportation Needs   Lack of Transportation (Medical): No   Lack of Transportation (Non-Medical): No  Physical Activity: Sufficiently Active   Days of Exercise per Week: 5 days   Minutes of Exercise per Session: 30 min  Stress: No  Stress Concern Present   Feeling of Stress : Not at all  Social Connections: Moderately Integrated   Frequency of Communication with Friends and Family: More than three times a week   Frequency of Social Gatherings with Friends and Family: More than three times a week   Attends Religious Services: More than 4 times per year   Active Member of [employer] or Organizations: Yes   Attends Music therapist: More than 4 times per year   Marital Status: Never married    Tobacco Counseling Counseling given: Not Answered   Clinical Intake:  Pre-visit preparation completed: Yes  Pain : No/denies pain     Nutritional Risks: None Diabetes: Yes CBG done?: No Did  pt. bring in CBG monitor from home?: No  How often do you need to have someone help you when you read instructions, pamphlets, or other written materials from your doctor or pharmacy?: 1 - Never What is the last grade level you completed in school?: Bachelor's Degree  Diabetic? yes  Interpreter Needed?: No  Information entered by :: Erin Abu, LPN   Activities of Daily Living In your present state of health, do you have any difficulty performing the following activities: 11/02/2020  Hearing? N  Vision? N  Difficulty concentrating or making decisions? N  Walking or climbing stairs? N  Dressing or bathing? N  Doing errands, shopping? N  Preparing Food and eating ? N  Using the Toilet? N  In the past six months, have you accidently leaked urine? N  Do you have problems with loss of bowel control? N  Managing your Medications? N  Managing your Finances? N  Housekeeping or managing your Housekeeping? N  Some recent data might be hidden    Patient Care Team: Horald Pollen, MD as PCP - General (Internal Medicine) Knox Royalty, RN as Case Manager  Indicate any recent Medical Services you may have received from other than [facility] providers in the past year (date may be approximate).      Assessment:   This is a routine wellness examination for Erin Benitez.  Hearing/Vision screen Hearing Screening - Comments:: Patient denied any hearing difficulty.   No hearing aids.  Vision Screening - Comments:: Patient wears corrective glasses/contacts.  Eye exam done annually by:  Dr. Monna Fam   Dietary issues and exercise activities discussed: Current Exercise Habits: Home exercise routine, Type of exercise: walking, Time (Minutes): 30, Frequency (Times/Week): 5, Weekly Exercise (Minutes/Week): 150, Intensity: Moderate, Exercise limited by: orthopedic condition(s);neurologic condition(s)   Goals Addressed             This Visit's Progress    Patient Stated       Tru to maintain HgA1C and blood pressure.      Depression Screen PHQ 2/9 Scores 11/02/2020 09/01/2020 07/07/2020 08/12/2019 07/30/2019 07/23/2019 04/29/2019  PHQ - 2 Score 0 0 0 0 0 0 0    Fall Risk Fall Risk  11/02/2020 07/07/2020 10/31/2019 08/12/2019 07/30/2019  Falls in the past year? 0 1 0 0 0  Number falls in past yr: 0 0 0 0 0  Injury with Fall? 0 0 0 0 0  Risk for fall due to : No Fall Risks No Fall Risks - - -  Follow up Falls evaluation completed Falls evaluation completed - Falls evaluation completed -    FALL RISK PREVENTION PERTAINING TO THE HOME:  Any stairs in or around the home? No  If so, are there any without handrails?  no Home free of loose throw rugs in walkways, pet beds, electrical cords, etc? Yes  Adequate lighting in your home to reduce risk of falls? Yes   ASSISTIVE DEVICES UTILIZED TO PREVENT FALLS:  Life alert? No  Use of a cane, walker or w/c? No  Grab bars in the bathroom? Yes  Shower chair or bench in shower? No  Elevated toilet seat or a handicapped toilet? No   TIMED UP AND GO:  Was the test performed? No .  Length of time to ambulate 10 feet: n/a sec.   Gait steady and fast without use of assistive device  Cognitive Function: Normal cognitive status assessed by direct  observation by this Nurse Health Advisor. No abnormalities found.  6CIT Screen 07/23/2019 07/18/2018 09/30/2017  What Year? 0 points 0 points 0 points  What month? 0 points 0 points 0 points  What time? 0 points 0 points 0 points  Count back from 20 0 points 0 points 0 points  Months in reverse 0 points 0 points 0 points  Repeat phrase 0 points 0 points 0 points  Total Score 0 0 0    Immunizations Immunization History  Administered Date(s) Administered   Fluad Quad(high Dose 65+) 10/23/2018, 10/31/2019   Influenza, High Dose Seasonal PF 10/30/2019   Influenza,inj,Quad PF,6+ Mos 03/09/2015   PFIZER(Purple Top)SARS-COV-2 Vaccination 04/20/2019, 05/04/2019   PPD Test 04/01/2013, 09/30/2017   Pneumococcal Conjugate-13 07/08/2017   Pneumococcal Polysaccharide-23 08/21/2018   Td 07/08/2017   Zoster Recombinat (Shingrix) 07/07/2020    TDAP status: Up to date  Flu Vaccine status: Due, Education has been provided regarding the importance of this vaccine. Advised may receive this vaccine at local pharmacy or Health Dept. Aware to provide a copy of the vaccination record if obtained from local pharmacy or Health Dept. Verbalized acceptance and understanding.  Pneumococcal vaccine status: Up to date  Covid-19 vaccine status: Completed vaccines  Qualifies for Shingles Vaccine? Yes   Zostavax completed No   Shingrix Completed?: No.    Education has been provided regarding the importance of this vaccine. Patient has been advised to call insurance company to determine out of pocket expense if they have not yet received this vaccine. Advised may also receive vaccine at local pharmacy or Health Dept. Verbalized acceptance and understanding.  Screening Tests Health Maintenance  Topic Date Due   FOOT EXAM  07/18/2019   COVID-19 Vaccine (3 - Booster) 10/04/2019   URINE MICROALBUMIN  07/29/2020   INFLUENZA VACCINE  08/31/2020   Zoster Vaccines- Shingrix (2 of 2) 09/01/2020   OPHTHALMOLOGY  EXAM  10/24/2020   HEMOGLOBIN A1C  01/06/2021   MAMMOGRAM  10/27/2021   COLONOSCOPY (Pts 45-8yrs Insurance coverage will need to be confirmed)  06/20/2024   TETANUS/TDAP  07/09/2027   DEXA SCAN  Completed   Hepatitis C Screening  Completed   HPV VACCINES  Aged Out    Health Maintenance  Health Maintenance Due  Topic Date Due   FOOT EXAM  07/18/2019   COVID-19 Vaccine (3 - Booster) 10/04/2019   URINE MICROALBUMIN  07/29/2020   INFLUENZA VACCINE  08/31/2020   Zoster Vaccines- Shingrix (2 of 2) 09/01/2020   OPHTHALMOLOGY EXAM  10/24/2020    Colorectal cancer screening: Type of screening: Colonoscopy. Completed 06/21/2019. Repeat every 5 years  Mammogram status: Completed 10/28/2019. Repeat every year  Bone Density status: Completed 01/08/2019. Results reflect: Bone density results: OSTEOPENIA. Repeat every 2 years.  Lung Cancer Screening: (Low Dose CT Chest recommended if Age 72-80 years, 30 pack-year currently smoking OR have quit w/in 15years.) does not qualify.   Lung Cancer Screening Referral: no  Additional Screening:  Hepatitis C Screening: does qualify; Completed yes  Vision Screening: Recommended annual ophthalmology exams for early detection of glaucoma and other disorders of the eye. Is the patient up to date with their annual eye exam?  Yes  Who is the provider or what is the name of the office in which the patient attends annual eye exams? Dr. Kandy Garrison If pt is not established with a provider, would they like to be referred to a provider to establish care? No .   Dental Screening: Recommended annual dental exams for proper oral hygiene  Community Resource Referral / Chronic  Care Management: CRR required this visit?  No   CCM required this visit?  No      Plan:     I have personally reviewed and noted the following in the patient's chart:   Medical and social history Use of alcohol, tobacco or illicit drugs  Current medications and supplements  including opioid prescriptions.  Functional ability and status Nutritional status Physical activity Advanced directives List of other physicians Hospitalizations, surgeries, and ER visits in previous 12 months Vitals Screenings to include cognitive, depression, and falls Referrals and appointments  In addition, I have reviewed and discussed with patient certain preventive protocols, quality metrics, and best practice recommendations. A written personalized care plan for preventive services as well as general preventive health recommendations were provided to patient.     Sheral Flow, LPN   14/05/3152   Nurse Notes:  Patient is cogitatively intact. There were no vitals filed for this visit. There is no height or weight on file to calculate BMI. Patient stated that she has no issues with gait or balance; does not use any assistive devices. Medications reviewed with patient; no opioid use noted.

## 2020-11-03 ENCOUNTER — Telehealth: Payer: Self-pay | Admitting: *Deleted

## 2020-11-03 ENCOUNTER — Encounter: Payer: Self-pay | Admitting: *Deleted

## 2020-11-03 ENCOUNTER — Telehealth: Payer: PPO

## 2020-11-03 NOTE — Telephone Encounter (Signed)
  Chronic Care Management   Follow Up Note   11/03/2020 Name: Erin Benitez MRN: [id number] DOB: [birth year]  Referred by: Horald Pollen, MD Reason for referral : Chronic Care Management (CCM RN CM Follow Up Telephone visit; DMII; HTN- unsuccessful attempt)  An unsuccessful telephone outreach was attempted today. The patient was referred to the case management team for assistance with care management and care coordination.   Follow Up Plan:  A HIPPA compliant phone message was left for the patient providing contact information and requesting a return call Will place request with scheduling care guide to contact patient to re-schedule today's missed CCM RN follow up telephone appointment if I do not hear back from patient later today  [name], RN, BSN, [place] [phone]: direct office [phone]: mobile

## 2020-11-06 ENCOUNTER — Telehealth: Payer: Self-pay | Admitting: *Deleted

## 2020-11-06 NOTE — Chronic Care Management (AMB) (Signed)
  Care Management   Note  11/06/2020 Name: Erin Benitez MRN: [id number] DOB: 1950-04-19  Erin Benitez is a 70 y.o. year old female who is a primary care patient of Horald Pollen, MD and is actively engaged with the care management team. I reached out to [name] by phone today to assist with re-scheduling a follow up visit with the RN Case Manager  Follow up plan: Unsuccessful telephone outreach attempt made. A HIPAA compliant phone message was left for the patient providing contact information and requesting a return call.  The care management team will reach out to the patient again over the next 7 days.  If patient returns call to provider office, please advise to call [facility]  Direct Dial: [phone]

## 2020-11-13 NOTE — Chronic Care Management (AMB) (Signed)
  Care Management   Note  11/13/2020 Name: Erin Benitez MRN: [id number] DOB: 1950-11-11  Erin Benitez is a 70 y.o. year old female who is a primary care patient of Horald Pollen, MD and is actively engaged with the care management team. I reached out to [name] by phone today to assist with re-scheduling a follow up visit with the RN Case Manager  Follow up plan: Telephone appointment with care management team member scheduled for:11/27/20  [facility]  Direct Dial: [phone]

## 2020-11-14 ENCOUNTER — Ambulatory Visit
Admission: RE | Admit: 2020-11-14 | Discharge: 2020-11-14 | Disposition: A | Discharge status: Home / Self Care | Payer: PPO | Source: Ambulatory Visit | Attending: Orthopaedic Surgery | Admitting: Orthopaedic Surgery

## 2020-11-14 ENCOUNTER — Other Ambulatory Visit: Payer: Self-pay

## 2020-11-14 DIAGNOSIS — M4802 Spinal stenosis, cervical region: Secondary | ICD-10-CM

## 2020-11-14 DIAGNOSIS — M5020 Other cervical disc displacement, unspecified cervical region: Secondary | ICD-10-CM

## 2020-11-15 ENCOUNTER — Other Ambulatory Visit: Payer: Self-pay | Admitting: Internal Medicine

## 2020-11-17 ENCOUNTER — Encounter: Payer: Self-pay | Admitting: Orthopaedic Surgery

## 2020-11-17 ENCOUNTER — Other Ambulatory Visit: Payer: Self-pay

## 2020-11-17 ENCOUNTER — Ambulatory Visit: Payer: PPO | Admitting: Orthopaedic Surgery

## 2020-11-17 VITALS — BP 161/76 | HR 86 | Ht 64.0 in | Wt 145.0 lb

## 2020-11-17 DIAGNOSIS — M5020 Other cervical disc displacement, unspecified cervical region: Secondary | ICD-10-CM

## 2020-11-17 DIAGNOSIS — M4802 Spinal stenosis, cervical region: Secondary | ICD-10-CM | POA: Diagnosis not present
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
## 2020-11-23 DIAGNOSIS — E119 Type 2 diabetes mellitus without complications: Secondary | ICD-10-CM | POA: Diagnosis not present

## 2020-11-23 DIAGNOSIS — H538 Other visual disturbances: Secondary | ICD-10-CM | POA: Diagnosis not present

## 2020-11-23 DIAGNOSIS — H43813 Vitreous degeneration, bilateral: Secondary | ICD-10-CM | POA: Diagnosis not present

## 2020-11-23 DIAGNOSIS — H40013 Open angle with borderline findings, low risk, bilateral: Secondary | ICD-10-CM | POA: Diagnosis not present

## 2020-11-27 ENCOUNTER — Ambulatory Visit (INDEPENDENT_AMBULATORY_CARE_PROVIDER_SITE_OTHER): Payer: PPO | Admitting: *Deleted

## 2020-11-27 DIAGNOSIS — E119 Type 2 diabetes mellitus without complications: Secondary | ICD-10-CM

## 2020-11-27 DIAGNOSIS — E1159 Type 2 diabetes mellitus with other circulatory complications: Secondary | ICD-10-CM

## 2020-11-27 DIAGNOSIS — I152 Hypertension secondary to endocrine disorders: Secondary | ICD-10-CM

## 2020-11-27 NOTE — Patient Instructions (Signed)
Visit Erin Benitez, it was nice talking with you today.   Please read over the attached information, and try to start monitoring and writing down your blood sugars at home    I look forward to talking to you again for an update on Wednesday January 13, 2021 at 9:00 am- please be listening out for my call that day.  I will call as close to 9:00 am as possible.   If you need to cancel or re-schedule our telephone visit, please call [phone] and one of our care guides will be happy to assist you.   I look forward to hearing about your progress.   Please don't hesitate to contact me if I can be of assistance to you before our next scheduled telephone appointment.   [name], RN, BSN, [facility] RN Care Coordination- [place] [phone]: direct office [phone]: mobile   PATIENT GOALS:  Goals Addressed             This Visit's Progress    Patient Self Care Activities: DM/ HTN   On track    Timeframe:  Long-Range Goal Priority:  Medium Start Date:           09/01/20                  Expected End Date:     09/01/21  Patient will self administer medications as prescribed Patient will attend all scheduled provider appointments Patient will call pharmacy for medication refills Patient will call provider office for new concerns or questions Patient will begin monitoring/ writing down on paper blood sugars at home               Patient will continue to monitor and write down blood pressures at home Patient will follow up to make sure she has had flu vaccine for 2022-23 flu season; if she has not, she will take steps to obtain as soon as possible Patient will continue to review "Living Well with Diabetes" booklet Patient will continue her efforts to follow low carbohydrate, low sugar, heart healthy diet       Preventing Influenza, Adult Influenza, also known as the flu, is an infection caused by a virus. The flu mainly affects the  nose, throat, and lungs (respiratory system). This infection causes common cold symptoms, as well as a high fever and body aches. The flu spreads easily from person to person (is contagious). The flu is most common from December through March. This period of time is called flu season. You can catch the flu virus by: Breathing in droplets from an infected person's cough or sneeze. Touching something that recently got the virus on it (is contaminated) and then touching your mouth, nose, or eyes. How can this condition affect me? If you get the flu, your friends, family, and co-workers are also at risk of getting it because the flu spreads easily to others. Having the flu can lead to complications, such as pneumonia, ear infection, and sinus infection. The flu also can be life-threatening, especially for babies, people older than age 76, and people who have serious long-term diseases. You can protect yourself and other people by: Keeping your body's defense system (immune system) strong. Getting a flu shot, or influenza vaccination, each year. Practicing good health habits. What can increase my risk? The following factors may make you more likely to get the flu: Not washing or sanitizing your hands often. Having close contact with many  people during cold and flu season. Touching your mouth, eyes, or nose without first washing or sanitizing your hands. Not getting a flu shot each year. Working in health care. You may be at risk for more serious flu symptoms if: You are older than age 65. You are pregnant. Your immune system is weak. You have a condition that makes the flu worse or life-threatening. You have severe obesity. What actions can I take to prevent this? Lifestyle You can lower your risk of getting the flu by keeping your immune system in good shape. Do this by: Eating a healthy diet. Drinking plenty of fluids. Drink enough fluid to keep your urine pale yellow. Getting enough  sleep. Exercising regularly. Do not use any products that contain nicotine or tobacco. These products include cigarettes, chewing tobacco, and vaping devices, such as e-cigarettes. If you need help quitting, ask your health care provider. Medicines Getting a flu shot every year can lower your risk of getting the flu. This is the best way to prevent the flu. A flu shot is recommended for everyone age 50 months or older. It is best to get a flu shot in the fall, as soon as it is available. But getting a flu shot during winter or spring instead is still a good idea. Flu season can last into early spring. Preventing the flu through vaccination means getting a new flu shot every year. This is because the flu virus changes slightly (mutates) from one year to the next. The flu shot does not completely protect you from all flu virus mutations. If you get the flu after you get the shot, the shot can make your illness milder and go away sooner. It also can prevent dangerous complications of the flu. If you are pregnant, you can and should get a flu shot. Check with your health care provider before getting a flu shot if you have had a reaction to the shot in the past or if you are allergic to eggs. Sometimes the vaccine is available as a nasal spray. In some years, the nasal spray has not been as effective against the flu virus. Check with your health care provider if you have questions about this. Most people recover from the flu by resting at home and drinking plenty of fluids. However, a prescription antiviral medicine may reduce your flu symptoms and may make your flu go away sooner. This medicine must be started within a few days of getting flu symptoms. Antiviral medicine may be prescribed for people who are at risk for more serious flu symptoms. Talk with your health care provider about whether you need an antiviral medicine.  General information   You can also lower your risk of getting the flu by  practicing good health habits. This is especially important during flu season. Avoid contact with people who are sick with flu or cold symptoms. Wash your hands with soap and water often and for at least 20 seconds. If soap and water are not available, use alcohol-based hand sanitizer. Avoid touching your hands to your face, especially when you have not washed your hands recently. Use a cleanser that kills germs (a disinfectant) to clean surfaces at home and at work that may have the flu virus on them. If you do get the flu, avoid spreading it to others by: Staying home until your symptoms, including your fever, have been gone for at least 24 hours. Covering your mouth and nose when you cough or sneeze. Avoiding close contact with  others, especially babies and older people.  Where to find more information Centers for Disease Control and Prevention: http://www.wolf.info/ American Academy of Family Physicians: www.familydoctor.org Contact a health care provider if: You have the flu and you develop new symptoms. You have diarrhea. You have a fever. Your cough gets worse, or you produce more mucus. Get help right away if you: Have trouble breathing. Have chest pain. These symptoms may represent a serious problem that is an emergency. Do not wait to see if the symptoms will go away. Get medical help right away. Call your local emergency services (911 in the [place] Do not drive yourself to the hospital. Summary The best way to prevent the flu is to get a flu shot (influenza vaccination) every year in the fall. Even if you get the flu after you have received the yearly flu shot, your flu may be milder and go away sooner because of your flu shot. If you get the flu, antiviral medicines that are started within a few days of flu symptoms may reduce your symptoms and reduce how long the flu lasts. You can also help prevent the flu by practicing good health habits. This information is not intended to replace  advice given to you by your health care provider. Make sure you discuss any questions you have with your health care provider. Document Revised: 09/06/2019 Document Reviewed: 09/06/2019 Elsevier Patient Education  2022 [place].   Patient verbalizes understanding of instructions provided today and agrees to view in MyChart Telephone follow up appointment with care management team member scheduled for:  Wednesday January 13, 2021 at 9:00 am The patient has been provided with contact information for the care management team and has been advised to call with any health related questions or concerns  [name], RN, BSN, [place] [phone]: direct office [phone]: mobile

## 2020-11-27 NOTE — Chronic Care Management (AMB) (Signed)
Chronic Care Management   CCM RN Visit Note  11/27/2020 Name: Erin Benitez MRN: [id number] DOB: 03-14-1950  Subjective: Erin Benitez is a 70 y.o. year old female who is a primary care patient of Sagardia, Ines Bloomer, MD. The care management team was consulted for assistance with disease management and care coordination needs.    Engaged with patient by telephone for follow up visit in response to provider referral for case management and/or care coordination services.   Consent to Services:  The patient was given information about Chronic Care Management services, agreed to services, and gave verbal consent prior to initiation of services.  Please see initial visit note for detailed documentation.  Patient agreed to services and verbal consent obtained.   Assessment: Review of patient past medical history, allergies, medications, health status, including review of consultants reports, laboratory and other test data, was performed as part of comprehensive evaluation and provision of chronic care management services.   CCM Care Plan Allergies  Allergen Reactions   Lisinopril Swelling   Peanut-Containing Drug Products Anaphylaxis, Hives, Swelling and Other (See Comments)    Pistachio's - swelling of the throat      Tramadol Nausea And Vomiting    Headache & dizziness with n&v   Outpatient Encounter Medications as of 11/27/2020  Medication Sig Note   albuterol (VENTOLIN HFA) 108 (90 Base) MCG/ACT inhaler Inhale 2 puffs into the lungs every 6 (six) hours as needed for wheezing (or cough). 09/01/2020: 09/01/20: Reports has not needed recently   amLODipine (NORVASC) 10 MG tablet Take 1 tablet (10 mg total) by mouth daily.    azithromycin (ZITHROMAX) 250 MG tablet Sig as indicated    benzonatate (TESSALON) 200 MG capsule Take 1 capsule (200 mg total) by mouth 2 (two) times daily as needed for cough.    esomeprazole (NEXIUM) 40 MG packet Take 40 mg by mouth daily before breakfast.    gabapentin  (NEURONTIN) 300 MG capsule Take 1-2 capsules (300-600 mg total) by mouth at bedtime. 09/01/2020: 09/01/20: Patient reports currently not taking; she is considering re-starting   glipiZIDE (GLUCOTROL) 10 MG tablet Take 1 tablet (10 mg total) by mouth daily before breakfast.    HYDROcodone bit-homatropine (HYCODAN) 5-1.5 MG/5ML syrup Take 5 mLs by mouth at bedtime as needed for cough.    metoprolol succinate (TOPROL-XL) 25 MG 24 hr tablet Take 1 tablet (25 mg total) by mouth daily. 09/01/2020: 09/01/20: Reports has not yet picked up from pharmacy- to follow up with outpatient pharmacy to obtain   nortriptyline (PAMELOR) 25 MG capsule Take 1 capsule (25 mg total) by mouth at bedtime. 09/01/2020: 09/01/20: Reports has not needed/ taken recently   pioglitazone (ACTOS) 30 MG tablet Take 1 tablet (30 mg total) by mouth daily.    rosuvastatin (CRESTOR) 40 MG tablet Take 1 tablet (40 mg total) by mouth daily.    Semaglutide 7 MG TABS Take 7 mg by mouth daily. 09/01/2020: 09/01/20: Patient reports has not yet started-- to call outpatient pharmacy to follow up   triamterene-hydrochlorothiazide (MAXZIDE-25) 37.5-25 MG tablet Take 1 tablet by mouth daily.    No facility-administered encounter medications on file as of 11/27/2020.   Patient Active Problem List   Diagnosis Date Noted   Protrusion of cervical intervertebral disc 10/21/2020   Spinal stenosis of cervical region 10/21/2020   Chronic left shoulder pain 07/07/2020   Dyslipidemia associated with type 2 diabetes mellitus ([place]) 09/06/2017   Diverticulosis of colon without hemorrhage 03/09/2015   Hyperlipidemia 03/09/2015  Gastroesophageal reflux disease without esophagitis 03/09/2015   Hypertension associated with diabetes ([place]) 04/01/2013   Asthma 04/01/2013   Conditions to be addressed/monitored:  HTN and DMII  Care Plan : RN Care Manager Plan of Care  Updates made by Knox Royalty, RN since 11/27/2020 12:00 AM     Problem: Chronic Disease Management  Needs   Priority: Medium     Long-Range Goal: Development of plan of care for long term disease management   Start Date: 09/01/2020  Expected End Date: 09/01/2021  Priority: Medium  Note:   Current Barriers:  Chronic Disease Management support and education needs related to HTN and DMII DMII- patient has not yet obtained semaglutide prescribed 07/07/20 during PCP office visit: will contact outpatient pharmacy- confirmed 10/08/20 patient obtained/ is taking as prescribed HTN- patient has not yet obtained metoprolol prescribed 07/07/20 during PCP office visit: will contact outpatient pharmacy- confirmed 10/08/20 patient obtained/ is taking as prescribed  RNCM Clinical Goal(s):  Patient will  verbalize understanding of plan for management of HTN and DMII through collaboration with RN Care manager, provider, and care team.  Interventions: 1:1 collaboration with primary care provider regarding development and update of comprehensive plan of care as evidenced by provider attestation and co-signature Inter-disciplinary care team collaboration (see longitudinal plan of care)  Diabetes:  (Status: Goal on track: YES.) Lab Results  Component Value Date   HGBA1C 7.3 (H) 07/07/2020  Reviewed prescribed diet with patient low carbohydrate, low sugar, heart healthy, low salt; Discussed plans with patient for ongoing care management follow up and provided patient with direct contact information for care management team;      Reviewed scheduled/upcoming provider appointments including: 01/06/21 PCP ;         Review of patient status, including review of consultants reports, relevant laboratory and other test results, and medications completed;      low carbohydrate, low sugar diet Discussed with patient monitoring blood sugars at home: she continues to report that she does not like sticking her fingers and has not been checking blood sugars at home regularly; continues to report "checks infrequently;" unfortunately,  she is not at home to review any blood sugars she has recorded with me; reports "when I take them, they are okay" Reinforced previously provided education around value of monitoring blood sugars at home: daughter has still not moved in with her, she expects this to happen soon- states daughter will assist once she is living with patient; this was encouraged Confirmed patient received previously provided printed educational material, "Living Well with Diabetes;" has been reviewing, finds information helpful, denies specific questions today- encouraged her ongoing review Confirmed no changes to medications, continues independently managing; denies medications concerns Confirmed patient continues to work outside of home Confirmed patient attended diabetic eye exam on November 23, 2020- positive reinforcement provided; discussed long-term complications of DMII, including risk of eye/ vision complications Reiterated previously provided education around signs/ symptoms/ corresponding action plan for development of low blood sugar- Confirmed no signs/ symptoms of same Confirmed patient continues trying to adhere to low carb, low sugar, heart healthy, low salt diet  Hypertension: (Status: Goal on track: YES.) Last practice recorded BP readings:  BP Readings from Last 3 Encounters:  07/07/20 138/80  06/23/20 (!) 170/88  05/12/20 119/76  Most recent eGFR/CrCl: No results found for: EGFR  No components found for: CRCL  Evaluation of current treatment plan related to hypertension self management and patient's adherence to plan as established by provider;   Provided  education on prescribed diet low salt, heart healthy;  Discussed complications of poorly controlled blood pressure such as heart disease, stroke, circulatory complications, vision complications, kidney impairment, sexual dysfunction;  Attempted to review recently recorded blood pressures at home with patient: she is not at home and unable to  review; states "they have been a little on the high side" which she attributes to orthopedic pain; she confirms she monitors blood pressures at home "every couple of weeks"- encouraged her to continue monitoring Confirmed patient attended orthopedic provider visit for ongoing chronic shoulder/ neck pain: states provider has recommended surgery, currently awaiting insurance pre-approval Assesses pain levels: she described "same as it has been," states controlled moderately with OTC analgesics (alternating tylenol/ advil)- states she has "learned to live" with pain, and knows it likely will not improve until she has needed surgery Discussed flu vaccine for 2022-23 flu season- she reports she has obtained this, but then states she "is not sure;" states she will double check her records at home and if she needs flu vaccine she will obtain-- this was encouraged Reports previously reported cough in September is now resolved- denies clinical concerns today  Patient Goals/Self-Care Activities: Patient will self administer medications as prescribed Patient will attend all scheduled provider appointments Patient will call pharmacy for medication refills Patient will call provider office for new concerns or questions Patient will begin monitoring/ writing down on paper blood sugars at home               Patient will continue to monitor and write down blood pressures at home Patient will follow up to make sure she has had flu vaccine for 2022-23 flu season; if she has not, she will take steps to obtain as soon as possible Patient will continue to review "Living Well with Diabetes" booklet Patient will continue her efforts to follow low carbohydrate, low sugar, heart healthy diet        Plan: Telephone follow up appointment with care management team member scheduled for:  Wednesday January 13, 2021 at 9:00 am The patient has been provided with contact information for the care management team and has been  advised to call with any health related questions or concerns   [name], RN, BSN, [place] [phone]: direct office [phone]: mobile

## 2020-11-30 DIAGNOSIS — E119 Type 2 diabetes mellitus without complications: Secondary | ICD-10-CM | POA: Diagnosis not present

## 2020-11-30 DIAGNOSIS — E1159 Type 2 diabetes mellitus with other circulatory complications: Secondary | ICD-10-CM

## 2020-11-30 DIAGNOSIS — I152 Hypertension secondary to endocrine disorders: Secondary | ICD-10-CM | POA: Diagnosis not present

## 2021-01-06 ENCOUNTER — Other Ambulatory Visit: Payer: Self-pay

## 2021-01-06 ENCOUNTER — Telehealth: Payer: Self-pay | Admitting: *Deleted

## 2021-01-06 ENCOUNTER — Encounter: Payer: Self-pay | Admitting: Emergency Medicine

## 2021-01-06 ENCOUNTER — Ambulatory Visit (INDEPENDENT_AMBULATORY_CARE_PROVIDER_SITE_OTHER): Payer: PPO | Admitting: Emergency Medicine

## 2021-01-06 VITALS — BP 134/76 | HR 100 | Temp 98.4°F | Ht 64.0 in | Wt 145.0 lb

## 2021-01-06 DIAGNOSIS — I152 Hypertension secondary to endocrine disorders: Secondary | ICD-10-CM | POA: Diagnosis not present

## 2021-01-06 DIAGNOSIS — K219 Gastro-esophageal reflux disease without esophagitis: Secondary | ICD-10-CM

## 2021-01-06 DIAGNOSIS — Z1231 Encounter for screening mammogram for malignant neoplasm of breast: Secondary | ICD-10-CM | POA: Diagnosis not present

## 2021-01-06 DIAGNOSIS — E1165 Type 2 diabetes mellitus with hyperglycemia: Secondary | ICD-10-CM | POA: Diagnosis not present

## 2021-01-06 DIAGNOSIS — Z23 Encounter for immunization: Secondary | ICD-10-CM | POA: Diagnosis not present

## 2021-01-06 DIAGNOSIS — E785 Hyperlipidemia, unspecified: Secondary | ICD-10-CM

## 2021-01-06 DIAGNOSIS — E1169 Type 2 diabetes mellitus with other specified complication: Secondary | ICD-10-CM

## 2021-01-06 DIAGNOSIS — E1159 Type 2 diabetes mellitus with other circulatory complications: Secondary | ICD-10-CM | POA: Diagnosis not present

## 2021-01-06 LAB — POCT GLYCOSYLATED HEMOGLOBIN (HGB A1C): Hemoglobin A1C: 5.9 % — AB (ref 4.0–5.6)

## 2021-01-06 MED ORDER — SEMAGLUTIDE 7 MG PO TABS
7.0000 mg | ORAL_TABLET | Freq: Every day | ORAL | 1 refills | Status: DC
Start: 2021-01-06 — End: 2021-07-07

## 2021-01-06 NOTE — Chronic Care Management (AMB) (Signed)
  Care Management   Note  01/06/2021 Name: Erin Benitez MRN: [id number] DOB: 08/05/1950  Erin Benitez is a 70 y.o. year old female who is a primary care patient of Horald Pollen, MD and is actively engaged with the care management team. I reached out to [name] by phone today to assist with re-scheduling a follow up visit with the RN Case Manager  Follow up plan: Unsuccessful telephone outreach attempt made. A HIPAA compliant phone message was left for the patient providing contact information and requesting a return call.  The care management team will reach out to the patient again over the next 7 days.  If patient returns call to provider office, please advise to call [facility] at [phone].  [facility]  Direct Dial: [phone]

## 2021-01-06 NOTE — Assessment & Plan Note (Signed)
Stable and well-controlled.  Continue Nexium 40 mg daily. 

## 2021-01-06 NOTE — Assessment & Plan Note (Signed)
Well-controlled hypertension. BP Readings from Last 3 Encounters:  01/06/21 134/76  11/17/20 (!) 161/76  10/21/20 (!) 181/88  Continue amlodipine 10 mg, metoprolol succinate 25 mg and Maxide Dietary approaches to stop hypertension discussed Well-controlled diabetes with hemoglobin A1c of 5.9 Lab Results  Component Value Date   HGBA1C 5.9 (A) 01/06/2021  Continue Rybelsus and glipizide.  Diet and nutrition discussed. Follow-up in 6 months.

## 2021-01-06 NOTE — Assessment & Plan Note (Signed)
Stable.  Cardiovascular risks associated with dyslipidemia, hypertension, and diabetes discussed. Continue rosuvastatin 40 mg daily. Lab Results  Component Value Date   CHOL 166 07/07/2020   HDL 45.60 07/07/2020   LDLCALC 88 07/07/2020   TRIG 163.0 (H) 07/07/2020   CHOLHDL 4 07/07/2020    The 10-year ASCVD risk score (Arnett DK, et al., 2019) is: 24.1%   Values used to calculate the score:     Age: 70 years     Sex: Female     Is Non-Hispanic African American: Yes     Diabetic: Yes     Tobacco smoker: No     Systolic Blood Pressure: 321 mmHg     Is BP treated: Yes     HDL Cholesterol: 45.6 mg/dL     Total Cholesterol: 166 mg/dL

## 2021-01-06 NOTE — Progress Notes (Signed)
Erin Benitez 70 y.o.   Chief Complaint  Patient presents with   Diabetes    6 month f/u. Pt states she has a scratchy , sore throat for 2 days.   Hypertension    HISTORY OF PRESENT ILLNESS: This is a 70 y.o. female here for 69-month follow-up of chronic medical problems. Mild sore throat for 2 days, feels "scratchy".  No other associated symptoms. Has the following chronic medical problems: #1 diabetes: taking glipizide and Rybelsus #2 hypertension, presently on amlodipine 10 mg and Maxide-25 and Toprol XL 25 mg daily Recent echocardiogram report reviewed.  Ejection fraction 67% with normal global function #3 history of heart murmur: Echocardiogram revealed trace aortic stenosis #4 chronic left shoulder pain.  Has seen orthopedist in the last 6 months.  Had physical therapy. MRI revealed tendinosis and couple of tears.  Conservative treatment not working. #5 chronic heartburn: Recent upper endoscopy showed erosive gastritis.  Intermittent upper abdominal pain.  Nexium works well for her. #6 recent colonoscopy showed diffuse diverticulosis but no cancer no polyps.  #7 dyslipidemia: On rosuvastatin 40 mg daily. BP Readings from Last 3 Encounters:  01/06/21 (!) 142/82  11/17/20 (!) 161/76  10/21/20 (!) 181/88     Diabetes Pertinent negatives for hypoglycemia include no dizziness or headaches. Pertinent negatives for diabetes include no chest pain.  Hypertension Pertinent negatives include no chest pain, headaches, palpitations or shortness of breath.    Prior to Admission medications   Medication Sig Start Date End Date Taking? Authorizing Provider  albuterol (VENTOLIN HFA) 108 (90 Base) MCG/ACT inhaler Inhale 2 puffs into the lungs every 6 (six) hours as needed for wheezing (or cough). 06/23/20  Yes Amyot, Nicholes Stairs, NP  amLODipine (NORVASC) 10 MG tablet Take 1 tablet (10 mg total) by mouth daily. 07/07/20  Yes Horald Pollen, MD  esomeprazole (NEXIUM) 40 MG packet Take 40 mg  by mouth daily before breakfast. 10/31/19  Yes Jacelyn Pi, Lilia Argue, MD  gabapentin (NEURONTIN) 300 MG capsule Take 1-2 capsules (300-600 mg total) by mouth at bedtime. 07/07/20  Yes Shonna Deiter, Ines Bloomer, MD  glipiZIDE (GLUCOTROL) 10 MG tablet Take 1 tablet (10 mg total) by mouth daily before breakfast. 07/07/20  Yes Jerol Rufener, Ines Bloomer, MD  metoprolol succinate (TOPROL-XL) 25 MG 24 hr tablet Take 1 tablet (25 mg total) by mouth daily. 07/07/20  Yes Jeffrie Lofstrom, Ines Bloomer, MD  nortriptyline (PAMELOR) 25 MG capsule Take 1 capsule (25 mg total) by mouth at bedtime. 10/31/19  Yes Jacelyn Pi, Lilia Argue, MD  pioglitazone (ACTOS) 30 MG tablet Take 1 tablet (30 mg total) by mouth daily. 10/31/19  Yes Jacelyn Pi, Lilia Argue, MD  rosuvastatin (CRESTOR) 40 MG tablet Take 1 tablet (40 mg total) by mouth daily. 07/07/20  Yes Oria Klimas, Ines Bloomer, MD  triamterene-hydrochlorothiazide (MAXZIDE-25) 37.5-25 MG tablet Take 1 tablet by mouth daily. 07/07/20  Yes Freda Jaquith, Ines Bloomer, MD  HYDROcodone bit-homatropine [facility]) 5-1.5 MG/5ML syrup Take 5 mLs by mouth at bedtime as needed for cough. Patient not taking: Reported on 01/06/2021 10/07/20   Horald Pollen, MD  Semaglutide 7 MG TABS Take 7 mg by mouth daily. Patient not taking: Reported on 01/06/2021 07/07/20   Horald Pollen, MD    Allergies  Allergen Reactions   Lisinopril Swelling   Peanut-Containing Drug Products Anaphylaxis, Hives, Swelling and Other (See Comments)    Pistachio's - swelling of the throat      Tramadol Nausea And Vomiting    Headache & dizziness with  n&v    Patient Active Problem List   Diagnosis Date Noted   Protrusion of cervical intervertebral disc 10/21/2020   Spinal stenosis of cervical region 10/21/2020   Chronic left shoulder pain 07/07/2020   Dyslipidemia associated with type 2 diabetes mellitus ([place]) 09/06/2017   Diverticulosis of colon without hemorrhage 03/09/2015   Hyperlipidemia 03/09/2015   Gastroesophageal reflux  disease without esophagitis 03/09/2015   Hypertension associated with diabetes ([place]) 04/01/2013    Past Medical History:  Diagnosis Date   Allergy    Asthma    Bronchitis    Cataract    Diverticulosis    DM2 (diabetes mellitus, type 2) (HCC)    GERD (gastroesophageal reflux disease)    Heart murmur    Hyperplastic colon polyp    Hypertension     Past Surgical History:  Procedure Laterality Date   CESAREAN SECTION     COLONOSCOPY     ESOPHAGOGASTRODUODENOSCOPY     TONSILLECTOMY      Social History   Socioeconomic History   Marital status: Single    Spouse name: Not on file   Number of children: 1   Years of education: Not on file   Highest education level: Not on file  Occupational History   Occupation: Educator  Tobacco Use   Smoking status: Never   Smokeless tobacco: Never  Vaping Use   Vaping Use: Never used  Substance and Sexual Activity   Alcohol use: No   Drug use: No   Sexual activity: Yes  Other Topics Concern   Not on file  Social History Narrative   She reports she is single. She has 1 daughter. 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     2 caffeinated beverages daily.   No alcohol tobacco or drug use.   Social Determinants of Health   Financial Resource Strain: Low Risk    Difficulty of Paying Living Expenses: Not hard at all  Food Insecurity: No Food Insecurity   Worried About Charity fundraiser in the Last Year: Never true   [place] in the Last Year: Never true  Transportation Needs: No Transportation Needs   Lack of Transportation (Medical): No   Lack of Transportation (Non-Medical): No  Physical Activity: Sufficiently Active   Days of Exercise per Week: 5 days   Minutes of Exercise per Session: 30 min  Stress: No Stress Concern Present   Feeling of Stress : Not at all   Social Connections: Moderately Integrated   Frequency of Communication with Friends and Family: More than three times a week   Frequency of Social Gatherings with Friends and Family: More than three times a week   Attends Religious Services: More than 4 times per year   Active Member of [employer] or Organizations: Yes   Attends Music therapist: More than 4 times per year   Marital Status: Never married  Human resources officer Violence: Not At Risk   Fear of Current or Ex-Partner: No   Emotionally Abused: No   Physically Abused: No   Sexually Abused: No    Family History  Problem Relation Age of Onset   Cancer Mother        ovarian cancer   Heart disease Mother    Liver disease Mother    Hyperlipidemia Mother    Stroke  Mother    Cancer Father        lung cancer   Cancer Sister    Hyperlipidemia Sister    Cancer Sister    Hyperlipidemia Sister    Diabetes Maternal Aunt    Colon cancer Maternal Aunt        x2 aunts   Colon cancer Maternal Uncle        2 uncles   Colon cancer Paternal Grandfather    Esophageal cancer Neg Hx    Rectal cancer Neg Hx    Stomach cancer Neg Hx      Review of Systems  Constitutional: Negative.  Negative for chills and fever.  HENT:  Positive for sore throat. Negative for congestion.   Respiratory: Negative.  Negative for cough and shortness of breath.   Cardiovascular: Negative.  Negative for chest pain and palpitations.  Gastrointestinal: Negative.  Negative for abdominal pain, nausea and vomiting.  Genitourinary: Negative.  Negative for dysuria and hematuria.  Skin: Negative.  Negative for rash.  Neurological: Negative.  Negative for dizziness and headaches.  All other systems reviewed and are negative.  Today's Vitals   01/06/21 0900 01/06/21 0931  BP: (!) 142/82 134/76  Pulse: 100   Temp: 98.4 F (36.9 C)   TempSrc: Oral   SpO2: 95%   Weight: 145 lb (65.8 kg)   Height: 5\' 4"  (1.626 m)    Body mass index is 24.89  kg/m. Wt Readings from Last 3 Encounters:  01/06/21 145 lb (65.8 kg)  11/17/20 145 lb (65.8 kg)  10/21/20 145 lb (65.8 kg)    Physical Exam Vitals reviewed.  Constitutional:      Appearance: Normal appearance.  HENT:     Head: Normocephalic.     Mouth/Throat:     Mouth: Mucous membranes are moist.     Pharynx: Oropharynx is clear.  Eyes:     Extraocular Movements: Extraocular movements intact.     Conjunctiva/sclera: Conjunctivae normal.     Pupils: Pupils are equal, round, and reactive to light.  Cardiovascular:     Rate and Rhythm: Normal rate and regular rhythm.     Pulses: Normal pulses.     Heart sounds: Murmur heard.  Pulmonary:     Effort: Pulmonary effort is normal.     Breath sounds: Normal breath sounds.  Musculoskeletal:     Cervical back: Normal range of motion and neck supple. No tenderness.  Lymphadenopathy:     Cervical: No cervical adenopathy.  Skin:    General: Skin is warm and dry.  Neurological:     General: No focal deficit present.     Mental Status: She is alert and oriented to person, place, and time.  Psychiatric:        Mood and Affect: Mood normal.        Behavior: Behavior normal.    Results for orders placed or performed in visit on 01/06/21 (from the past 24 hour(s))  POCT glycosylated hemoglobin (Hb A1C)     Status: Abnormal   Collection Time: 01/06/21  9:03 AM  Result Value Ref Range   Hemoglobin A1C 5.9 (A) 4.0 - 5.6 %   HbA1c POC (<> result, manual entry)     HbA1c, POC (prediabetic range)     HbA1c, POC (controlled diabetic range)      ASSESSMENT & PLAN: Problem List Items Addressed This Visit       Cardiovascular and Mediastinum   Hypertension associated with diabetes ([place]) - Primary  Well-controlled hypertension. BP Readings from Last 3 Encounters:  01/06/21 134/76  11/17/20 (!) 161/76  10/21/20 (!) 181/88  Continue amlodipine 10 mg, metoprolol succinate 25 mg and Maxide Dietary approaches to stop hypertension  discussed Well-controlled diabetes with hemoglobin A1c of 5.9 Lab Results  Component Value Date   HGBA1C 5.9 (A) 01/06/2021  Continue Rybelsus and glipizide.  Diet and nutrition discussed. Follow-up in 6 months.        Relevant Medications   Semaglutide 7 MG TABS     Digestive   Gastroesophageal reflux disease without esophagitis    Stable and well-controlled.  Continue Nexium 40 mg daily.        Endocrine   Dyslipidemia associated with type 2 diabetes mellitus ([place])    Stable.  Cardiovascular risks associated with dyslipidemia, hypertension, and diabetes discussed. Continue rosuvastatin 40 mg daily. Lab Results  Component Value Date   CHOL 166 07/07/2020   HDL 45.60 07/07/2020   LDLCALC 88 07/07/2020   TRIG 163.0 (H) 07/07/2020   CHOLHDL 4 07/07/2020    The 10-year ASCVD risk score (Arnett DK, et al., 2019) is: 24.1%   Values used to calculate the score:     Age: 35 years     Sex: Female     Is Non-Hispanic African American: Yes     Diabetic: Yes     Tobacco smoker: No     Systolic Blood Pressure: 481 mmHg     Is BP treated: Yes     HDL Cholesterol: 45.6 mg/dL     Total Cholesterol: 166 mg/dL       Relevant Medications   Semaglutide 7 MG TABS   Other Visit Diagnoses     Type 2 diabetes mellitus with hyperglycemia, without long-term current use of insulin (HCC)       Relevant Medications   Semaglutide 7 MG TABS   Other Relevant Orders   POCT glycosylated hemoglobin (Hb A1C) (Completed)   Need for influenza vaccination       Relevant Orders   Flu Vaccine QUAD High Dose(Fluad)   Need for shingles vaccine       Relevant Orders   Varicella-zoster vaccine IM (Shingrix)   Encounter for screening mammogram for malignant neoplasm of breast       Relevant Orders   MM Digital Screening        Patient Instructions  Diabetes Mellitus and Nutrition, Adult When you have diabetes, or diabetes mellitus, it is very important to have healthy eating habits  because your blood sugar (glucose) levels are greatly affected by what you eat and drink. Eating healthy foods in the right amounts, at about the same times every day, can help you: Manage your blood glucose. Lower your risk of heart disease. Improve your blood pressure. Reach or maintain a healthy weight. What can affect my meal plan? Every person with diabetes is different, and each person has different needs for a meal plan. Your health care provider may recommend that you work with a dietitian to make a meal plan that is best for you. Your meal plan may vary depending on factors such as: The calories you need. The medicines you take. Your weight. Your blood glucose, blood pressure, and cholesterol levels. Your activity level. Other health conditions you have, such as heart or kidney disease. How do carbohydrates affect me? Carbohydrates, also called carbs, affect your blood glucose level more than any other type of food. Eating carbs raises the amount of glucose in your blood. It is  important to know how many carbs you can safely have in each meal. This is different for every person. Your dietitian can help you calculate how many carbs you should have at each meal and for each snack. How does alcohol affect me? Alcohol can cause a decrease in blood glucose (hypoglycemia), especially if you use insulin or take certain diabetes medicines by mouth. Hypoglycemia can be a life-threatening condition. Symptoms of hypoglycemia, such as sleepiness, dizziness, and confusion, are similar to symptoms of having too much alcohol. Do not drink alcohol if: Your health care provider tells you not to drink. You are pregnant, may be pregnant, or are planning to become pregnant. If you drink alcohol: Limit how much you have to: 0-1 drink a day for women. 0-2 drinks a day for men. Know how much alcohol is in your drink. In the [place] one drink equals one 12 oz bottle of beer (355 mL), one 5 oz glass of wine  (148 mL), or one 1 oz glass of hard liquor (44 mL). Keep yourself hydrated with water, diet soda, or unsweetened iced tea. Keep in mind that regular soda, juice, and other mixers may contain a lot of sugar and must be counted as carbs. What are tips for following this plan? Reading food labels Start by checking the serving size on the Nutrition Facts label of packaged foods and drinks. The number of calories and the amount of carbs, fats, and other nutrients listed on the label are based on one serving of the item. Many items contain more than one serving per package. Check the total grams (g) of carbs in one serving. Check the number of grams of saturated fats and trans fats in one serving. Choose foods that have a low amount or none of these fats. Check the number of milligrams (mg) of salt (sodium) in one serving. Most people should limit total sodium intake to less than 2,300 mg per day. Always check the nutrition information of foods labeled as "low-fat" or "nonfat." These foods may be higher in added sugar or refined carbs and should be avoided. Talk to your dietitian to identify your daily goals for nutrients listed on the label. Shopping Avoid buying canned, pre-made, or processed foods. These foods tend to be high in fat, sodium, and added sugar. Shop around the outside edge of the grocery store. This is where you will most often find fresh fruits and vegetables, bulk grains, fresh meats, and fresh dairy products. Cooking Use low-heat cooking methods, such as baking, instead of high-heat cooking methods, such as deep frying. Cook using healthy oils, such as olive, canola, or sunflower oil. Avoid cooking with butter, cream, or high-fat meats. Meal planning Eat meals and snacks regularly, preferably at the same times every day. Avoid going long periods of time without eating. Eat foods that are high in fiber, such as fresh fruits, vegetables, beans, and whole grains. Eat 4-6 oz (112-168  g) of lean protein each day, such as lean meat, chicken, fish, eggs, or tofu. One ounce (oz) (28 g) of lean protein is equal to: 1 oz (28 g) of meat, chicken, or fish. 1 egg.  cup (62 g) of tofu. Eat some foods each day that contain healthy fats, such as avocado, nuts, seeds, and fish. What foods should I eat? Fruits Berries. Apples. Oranges. Peaches. Apricots. Plums. Grapes. Mangoes. Papayas. Pomegranates. Kiwi. Cherries. Vegetables Leafy greens, including lettuce, spinach, kale, chard, collard greens, mustard greens, and cabbage. Beets. Cauliflower. Broccoli. Carrots. Green beans. Tomatoes.  Peppers. Onions. Cucumbers. Brussels sprouts. Grains Whole grains, such as whole-wheat or whole-grain bread, crackers, tortillas, cereal, and pasta. Unsweetened oatmeal. Quinoa. Brown or wild rice. Meats and other proteins Seafood. Poultry without skin. Lean cuts of poultry and beef. Tofu. Nuts. Seeds. Dairy Low-fat or fat-free dairy products such as milk, yogurt, and cheese. The items listed above may not be a complete list of foods and beverages you can eat and drink. Contact a dietitian for more information. What foods should I avoid? Fruits Fruits canned with syrup. Vegetables Canned vegetables. Frozen vegetables with butter or cream sauce. Grains Refined white flour and flour products such as bread, pasta, snack foods, and cereals. Avoid all processed foods. Meats and other proteins Fatty cuts of meat. Poultry with skin. Breaded or fried meats. Processed meat. Avoid saturated fats. Dairy Full-fat yogurt, cheese, or milk. Beverages Sweetened drinks, such as soda or iced tea. The items listed above may not be a complete list of foods and beverages you should avoid. Contact a dietitian for more information. Questions to ask a health care provider Do I need to meet with a certified diabetes care and education specialist? Do I need to meet with a dietitian? What number can I call if I have  questions? When are the best times to check my blood glucose? Where to find more information: American Diabetes Association: diabetes.org Academy of Nutrition and Dietetics: eatright.[employer] of Diabetes and Digestive and Kidney Diseases: [employer] Association of Diabetes Care & Education Specialists: diabeteseducator.org Summary It is important to have healthy eating habits because your blood sugar (glucose) levels are greatly affected by what you eat and drink. It is important to use alcohol carefully. A healthy meal plan will help you manage your blood glucose and lower your risk of heart disease. Your health care provider may recommend that you work with a dietitian to make a meal plan that is best for you. This information is not intended to replace advice given to you by your health care provider. Make sure you discuss any questions you have with your health care provider. Document Revised: 08/21/2019 Document Reviewed: 08/21/2019 Elsevier Patient Education  2022 [place], MD [facility]

## 2021-01-06 NOTE — Patient Instructions (Signed)

## 2021-01-07 NOTE — Chronic Care Management (AMB) (Signed)
  Care Management   Note  01/07/2021 Name: Erin Benitez MRN: [id number] DOB: 1950/12/26  Erin Benitez is a 70 y.o. year old female who is a primary care patient of Horald Pollen, MD and is actively engaged with the care management team. I reached out to [name] by phone today to assist with re-scheduling a follow up visit with the RN Case Manager  Follow up plan: Unsuccessful telephone outreach attempt made. A HIPAA compliant phone message was left for the patient providing contact information and requesting a return call.  The care management team will reach out to the patient again over the next 7 days.  If patient returns call to provider office, please advise to call [place] at [phone].  [place] Management  Direct Dial: [phone]

## 2021-01-13 ENCOUNTER — Telehealth: Payer: PPO

## 2021-01-13 NOTE — Chronic Care Management (AMB) (Signed)
°  Care Management   Note  01/13/2021 Name: Erin Benitez MRN: [id number] DOB: 21-Nov-1950  Erin Benitez is a 70 y.o. year old female who is a primary care patient of Horald Pollen, MD and is actively engaged with the care management team. I reached out to [name] by phone today to assist with re-scheduling a follow up visit with the RN Case Manager  Follow up plan: A third unsuccessful telephone outreach attempt made. A HIPAA compliant phone message was left for the patient providing contact information and requesting a return call. Unable to make contact on outreach attempts x 3. PCP Horald Pollen, MD notified via routed documentation in medical record. We have been unable to make contact with the patient for follow up. The care management team is available to follow up with the patient after provider conversation with the patient regarding recommendation for care management engagement and subsequent re-referral to the care management team. If patient returns call to provider office, please advise to call [place] at [phone].  [place] Management  Direct Dial: [phone]

## 2021-01-14 ENCOUNTER — Ambulatory Visit (INDEPENDENT_AMBULATORY_CARE_PROVIDER_SITE_OTHER): Payer: PPO | Admitting: *Deleted

## 2021-01-14 DIAGNOSIS — E1159 Type 2 diabetes mellitus with other circulatory complications: Secondary | ICD-10-CM

## 2021-01-14 DIAGNOSIS — E1169 Type 2 diabetes mellitus with other specified complication: Secondary | ICD-10-CM

## 2021-01-14 NOTE — Chronic Care Management (AMB) (Signed)
Chronic Care Management   CCM RN Visit Note  01/14/2021 Name: Erin Benitez MRN: [id number] DOB: August 14, 1950  Subjective: Erin Benitez is a 70 y.o. year old female who is a primary care patient of Sagardia, Ines Bloomer, MD. The care management team was consulted for assistance with disease management and care coordination needs.    Collaboration with scheduling care guide  for  case closure  in response to provider referral for case management and/or care coordination services.   Consent to Services:  The patient was given information about Chronic Care Management services, agreed to services, and gave verbal consent prior to initiation of services.  Please see initial visit note for detailed documentation.  Patient agreed to services and verbal consent obtained.   Assessment: Review of patient past medical history, allergies, medications, health status, including review of consultants reports, laboratory and other test data, was performed as part of comprehensive evaluation and provision of chronic care management services.  CCM Care Plan  Allergies  Allergen Reactions   Lisinopril Swelling   Peanut-Containing Drug Products Anaphylaxis, Hives, Swelling and Other (See Comments)    Pistachio's - swelling of the throat      Tramadol Nausea And Vomiting    Headache & dizziness with n&v   Outpatient Encounter Medications as of 01/14/2021  Medication Sig Note   albuterol (VENTOLIN HFA) 108 (90 Base) MCG/ACT inhaler Inhale 2 puffs into the lungs every 6 (six) hours as needed for wheezing (or cough). 09/01/2020: 09/01/20: Reports has not needed recently   amLODipine (NORVASC) 10 MG tablet Take 1 tablet (10 mg total) by mouth daily.    esomeprazole (NEXIUM) 40 MG packet Take 40 mg by mouth daily before breakfast.    gabapentin (NEURONTIN) 300 MG capsule Take 1-2 capsules (300-600 mg total) by mouth at bedtime. 09/01/2020: 09/01/20: Patient reports currently not taking; she is considering re-starting    glipiZIDE (GLUCOTROL) 10 MG tablet Take 1 tablet (10 mg total) by mouth daily before breakfast.    metoprolol succinate (TOPROL-XL) 25 MG 24 hr tablet Take 1 tablet (25 mg total) by mouth daily. 09/01/2020: 09/01/20: Reports has not yet picked up from pharmacy- to follow up with outpatient pharmacy to obtain   nortriptyline (PAMELOR) 25 MG capsule Take 1 capsule (25 mg total) by mouth at bedtime. 09/01/2020: 09/01/20: Reports has not needed/ taken recently   rosuvastatin (CRESTOR) 40 MG tablet Take 1 tablet (40 mg total) by mouth daily.    Semaglutide 7 MG TABS Take 7 mg by mouth daily.    triamterene-hydrochlorothiazide (MAXZIDE-25) 37.5-25 MG tablet Take 1 tablet by mouth daily.    No facility-administered encounter medications on file as of 01/14/2021.   Patient Active Problem List   Diagnosis Date Noted   Protrusion of cervical intervertebral disc 10/21/2020   Spinal stenosis of cervical region 10/21/2020   Chronic left shoulder pain 07/07/2020   Dyslipidemia associated with type 2 diabetes mellitus ([place]) 09/06/2017   Diverticulosis of colon without hemorrhage 03/09/2015   Hyperlipidemia 03/09/2015   Gastroesophageal reflux disease without esophagitis 03/09/2015   Hypertension associated with diabetes ([place]) 04/01/2013   Conditions to be addressed/monitored:  HTN and HLD  Care Plan : RN Care Manager Plan of Care  Updates made by Knox Royalty, RN since 01/14/2021 12:00 AM     Problem: Chronic Disease Management Needs   Priority: Medium     Long-Range Goal: Development of plan of care for long term disease management   Start Date: 09/01/2020  Expected End  Date: 09/01/2021  Priority: Medium  Note:   Current Barriers:  Chronic Disease Management support and education needs related to HTN and DMII DMII- patient has not yet obtained semaglutide prescribed 07/07/20 during PCP office visit: will contact outpatient pharmacy- confirmed 10/08/20 patient obtained/ is taking as prescribed HTN-  patient has not yet obtained metoprolol prescribed 07/07/20 during PCP office visit: will contact outpatient pharmacy- confirmed 10/08/20 patient obtained/ is taking as prescribed  RNCM Clinical Goal(s):  Patient will  verbalize understanding of plan for management of HTN and DMII through collaboration with RN Care manager, provider, and care team.  Interventions: 1:1 collaboration with primary care provider regarding development and update of comprehensive plan of care as evidenced by provider attestation and co-signature Inter-disciplinary care team collaboration (see longitudinal plan of care)  01/14/21: Case Closure; unable to maintain contact with patient after 3 attempts to re-scheduled telephone visit; case closure accordingly; PCP notified       Plan: We have been unable to make contact with the patient for follow up. The care management team is available to follow up with the patient after provider conversation with the patient regarding recommendation for care management engagement and subsequent re-referral to the care management team. No further follow up required: case closure 01/14/21  [name], RN, BSN, [place] [phone]: direct office

## 2021-01-21 DIAGNOSIS — H43813 Vitreous degeneration, bilateral: Secondary | ICD-10-CM | POA: Diagnosis not present

## 2021-01-30 DIAGNOSIS — E785 Hyperlipidemia, unspecified: Secondary | ICD-10-CM

## 2021-01-30 DIAGNOSIS — E1169 Type 2 diabetes mellitus with other specified complication: Secondary | ICD-10-CM

## 2021-01-30 DIAGNOSIS — I152 Hypertension secondary to endocrine disorders: Secondary | ICD-10-CM

## 2021-01-30 DIAGNOSIS — E1159 Type 2 diabetes mellitus with other circulatory complications: Secondary | ICD-10-CM

## 2021-02-23 ENCOUNTER — Ambulatory Visit: Payer: PPO

## 2021-03-11 ENCOUNTER — Ambulatory Visit
Admission: RE | Admit: 2021-03-11 | Discharge: 2021-03-11 | Disposition: A | Discharge status: Home / Self Care | Payer: PPO | Source: Ambulatory Visit | Attending: Emergency Medicine | Admitting: Emergency Medicine

## 2021-03-11 DIAGNOSIS — Z1231 Encounter for screening mammogram for malignant neoplasm of breast: Secondary | ICD-10-CM

## 2021-03-23 ENCOUNTER — Encounter: Payer: Self-pay | Admitting: Emergency Medicine

## 2021-03-23 ENCOUNTER — Other Ambulatory Visit: Payer: Self-pay | Admitting: Emergency Medicine

## 2021-03-23 ENCOUNTER — Other Ambulatory Visit: Payer: Self-pay

## 2021-03-23 ENCOUNTER — Ambulatory Visit (INDEPENDENT_AMBULATORY_CARE_PROVIDER_SITE_OTHER): Payer: PPO | Admitting: Emergency Medicine

## 2021-03-23 VITALS — BP 144/86 | HR 85 | Temp 98.5°F | Ht 64.0 in | Wt 143.0 lb

## 2021-03-23 DIAGNOSIS — R1013 Epigastric pain: Secondary | ICD-10-CM | POA: Insufficient documentation

## 2021-03-23 DIAGNOSIS — E785 Hyperlipidemia, unspecified: Secondary | ICD-10-CM

## 2021-03-23 DIAGNOSIS — I152 Hypertension secondary to endocrine disorders: Secondary | ICD-10-CM

## 2021-03-23 DIAGNOSIS — E1169 Type 2 diabetes mellitus with other specified complication: Secondary | ICD-10-CM

## 2021-03-23 DIAGNOSIS — E1159 Type 2 diabetes mellitus with other circulatory complications: Secondary | ICD-10-CM | POA: Diagnosis not present

## 2021-03-23 LAB — LIPID PANEL
Cholesterol: 241 mg/dL — ABNORMAL HIGH (ref 0–200)
HDL: 48.8 mg/dL (ref >39.00)
NonHDL: 192.45
Total CHOL/HDL Ratio: 5
Triglycerides: 224 mg/dL — ABNORMAL HIGH (ref 0.0–149.0)
VLDL: 44.8 mg/dL — ABNORMAL HIGH (ref 0.0–40.0)

## 2021-03-23 LAB — CBC WITH DIFFERENTIAL/PLATELET
Basophils Absolute: 0 10*3/uL (ref 0.0–0.1)
Basophils Relative: 0.6 % (ref 0.0–3.0)
Eosinophils Absolute: 0.2 10*3/uL (ref 0.0–0.7)
Eosinophils Relative: 4.5 % (ref 0.0–5.0)
HCT: 38.2 % (ref 36.0–46.0)
Hemoglobin: 12.3 g/dL (ref 12.0–15.0)
Lymphocytes Relative: 32.6 % (ref 12.0–46.0)
Lymphs Abs: 1.4 10*3/uL (ref 0.7–4.0)
MCHC: 32.3 g/dL (ref 30.0–36.0)
MCV: 84.3 fl (ref 78.0–100.0)
Monocytes Absolute: 0.4 10*3/uL (ref 0.1–1.0)
Monocytes Relative: 9.4 % (ref 3.0–12.0)
Neutro Abs: 2.3 10*3/uL (ref 1.4–7.7)
Neutrophils Relative %: 52.9 % (ref 43.0–77.0)
Platelets: 250 10*3/uL (ref 150.0–400.0)
RBC: 4.53 Mil/uL (ref 3.87–5.11)
RDW: 14.7 % (ref 11.5–15.5)
WBC: 4.3 10*3/uL (ref 4.0–10.5)

## 2021-03-23 LAB — COMPREHENSIVE METABOLIC PANEL
ALT: 26 U/L (ref 0–35)
AST: 22 U/L (ref 0–37)
Albumin: 4.7 g/dL (ref 3.5–5.2)
Alkaline Phosphatase: 125 U/L — ABNORMAL HIGH (ref 39–117)
BUN: 12 mg/dL (ref 6–23)
CO2: 31 mEq/L (ref 19–32)
Calcium: 9.8 mg/dL (ref 8.4–10.5)
Chloride: 101 mEq/L (ref 96–112)
Creatinine, Ser: 0.74 mg/dL (ref 0.40–1.20)
GFR: 81.99 mL/min (ref >60.00)
Glucose, Bld: 166 mg/dL — ABNORMAL HIGH (ref 70–99)
Potassium: 4.3 mEq/L (ref 3.5–5.1)
Sodium: 138 mEq/L (ref 135–145)
Total Bilirubin: 0.4 mg/dL (ref 0.2–1.2)
Total Protein: 8 g/dL (ref 6.0–8.3)

## 2021-03-23 LAB — HEMOGLOBIN A1C: Hgb A1c MFr Bld: 7 % — ABNORMAL HIGH (ref 4.6–6.5)

## 2021-03-23 LAB — LDL CHOLESTEROL, DIRECT: Direct LDL: 134 mg/dL

## 2021-03-23 LAB — LIPASE: Lipase: 31 U/L (ref 11.0–59.0)

## 2021-03-23 MED ORDER — PANTOPRAZOLE SODIUM 40 MG PO TBEC: 40.0000 mg | DELAYED_RELEASE_TABLET | Freq: Every day | ORAL | 3 refills | Status: DC

## 2021-03-23 MED ORDER — FAMOTIDINE 40 MG PO TABS: 40.0000 mg | ORAL_TABLET | Freq: Every day | ORAL | 0 refills | Status: DC

## 2021-03-23 NOTE — Assessment & Plan Note (Signed)
Differential diagnosis discussed.  Upper endoscopy report from 2021 reviewed with patient. Most likely same problem, multiple erosions in the stomach. Labs done today including lipase. We will start pantoprazole 40 mg daily in the morning and Pepcid 40 mg at bedtime. Diet and nutrition discussed. Follow-up in 2 weeks.  May need GI follow-up as well.

## 2021-03-23 NOTE — Patient Instructions (Signed)
Gastritis, Adult Gastritis is irritation and swelling (inflammation) of the stomach. There are two kinds of gastritis: Acute gastritis. This kind develops quickly. Chronic gastritis. This kind is much more common. It develops slowly and lasts for a long time. It is important to get help for this condition. If you do not get help, your stomach can bleed, and you can get sores (ulcers) in your stomach. What are the causes? This condition may be caused by: Germs that get to your stomach and cause an infection. Drinking too much alcohol. Medicines you are taking. Having too much acid in the stomach. Having a disease of the stomach. Other causes may include: An allergic reaction. Some cancer treatments (radiation). Smoking cigarettes or using products that contain nicotine or tobacco. In some cases, the cause of this condition is not known. What increases the risk? Having a disease of the intestines. Having Crohn's disease. Using aspirin or ibuprofen and other NSAIDs to treat other conditions. Stress. What are the signs or symptoms? Pain in your stomach. A burning feeling in your stomach. Feeling like you may vomit (nauseous). Vomiting or vomiting blood. Feeling too full after you eat. Weight loss. Bad breath. Blood in your poop (stool). In some cases, there are no symptoms. How is this treated? This condition is treated with medicines. The medicines that are used depend on what caused the condition. You may be given: Antibiotic medicine, if your condition was caused by an infection from germs. H2 blockers and similar medicines, if your condition was caused by too much acid in the stomach. Treatment may also include stopping the use of certain medicines, such as aspirin or ibuprofen. Follow these instructions at home: Medicines Take over-the-counter and prescription medicines only as told by your doctor. If you were prescribed an antibiotic medicine, take it as told by your doctor.  Do not stop taking it even if you start to feel better. Alcohol use Do not drink alcohol if: Your doctor tells you not to drink. You are pregnant, may be pregnant, or are planning to become pregnant. If you drink alcohol: Limit your use to: 0-1 drink a day for women. 0-2 drinks a day for men. Know how much alcohol is in your drink. In the [place] one drink equals one 12 oz bottle of beer (355 mL), one 5 oz glass of wine (148 mL), or one 1 oz glass of hard liquor (44 mL). General instructions  Eat small meals often, instead of large meals. Avoid foods and drinks that make you feel worse. Drink enough fluid to keep your pee (urine) pale yellow. Talk with your doctor about ways to manage stress. You can exercise or do deep breathing, meditation, or yoga. Do not smoke or use any products that contain nicotine or tobacco. If you need help quitting, ask your doctor. Keep all follow-up visits. Contact a doctor if: Your symptoms get worse. Your stomach pain gets worse. Your symptoms go away and then come back. You have a fever. Get help right away if: You vomit blood or something that looks like coffee grounds. You have black or dark red poop. You throw up any time you try to drink fluids. These symptoms may be an emergency. Get help right away. Call your local emergency services (911 in the [place] Do not wait to see if the symptoms will go away. Do not drive yourself to the hospital. Summary Gastritis is irritation and swelling (inflammation) of the stomach. You must get help for this condition. If you do  not get help, your stomach can bleed, and you can get sores (ulcers) in your stomach. You can be treated with medicines for germs or medicines to block too much acid in your stomach. This information is not intended to replace advice given to you by your health care provider. Make sure you discuss any questions you have with your health care provider. Document Revised: 05/23/2020 Document  Reviewed: 05/23/2020 Elsevier Patient Education  [place].

## 2021-03-23 NOTE — Progress Notes (Signed)
Erin Benitez 71 y.o.   Chief Complaint  Patient presents with   GI Problem    Pt states she feels nausea and stomach is upset x 1 week.    HISTORY OF PRESENT ILLNESS: Acute problem visit today. This is a 71 y.o. female complaining of epigastric pain radiating up into the chest that started about 3 weeks ago.  No melena or rectal bleeding.  Took over-the-counter Nexium with some relief.  Also developed some constipation.  Better now.  Last bowel movement last night abnormal. Still having intermittent upper abdominal discomfort.  No other associated symptoms. Upper endoscopy done in May 2021 reviewed with patient.  Shows multiple erosions in the stomach.  Colonoscopy showed diverticulosis. No other complaints or medical concerns today.  GI Problem The primary symptoms include abdominal pain and nausea. Primary symptoms do not include fever, vomiting, diarrhea, melena, dysuria or rash.  The illness is also significant for constipation. The illness does not include chills.    Prior to Admission medications   Medication Sig Start Date End Date Taking? Authorizing Provider  albuterol (VENTOLIN HFA) 108 (90 Base) MCG/ACT inhaler Inhale 2 puffs into the lungs every 6 (six) hours as needed for wheezing (or cough). 06/23/20  Yes Amyot, Nicholes Stairs, NP  amLODipine (NORVASC) 10 MG tablet Take 1 tablet (10 mg total) by mouth daily. 07/07/20  Yes Horald Pollen, MD  esomeprazole (NEXIUM) 40 MG packet Take 40 mg by mouth daily before breakfast. 10/31/19  Yes Jacelyn Pi, Lilia Argue, MD  gabapentin (NEURONTIN) 300 MG capsule Take 1-2 capsules (300-600 mg total) by mouth at bedtime. 07/07/20  Yes Neviah Braud, Ines Bloomer, MD  glipiZIDE (GLUCOTROL) 10 MG tablet Take 1 tablet (10 mg total) by mouth daily before breakfast. 07/07/20  Yes Marquise Lambson, Ines Bloomer, MD  metoprolol succinate (TOPROL-XL) 25 MG 24 hr tablet Take 1 tablet (25 mg total) by mouth daily. 07/07/20  Yes Willis Holquin, Ines Bloomer, MD  nortriptyline  (PAMELOR) 25 MG capsule Take 1 capsule (25 mg total) by mouth at bedtime. 10/31/19  Yes Jacelyn Pi, Lilia Argue, MD  rosuvastatin (CRESTOR) 40 MG tablet Take 1 tablet (40 mg total) by mouth daily. 07/07/20  Yes Bijal Siglin, Ines Bloomer, MD  Semaglutide 7 MG TABS Take 7 mg by mouth daily. 01/06/21  Yes Tamirra Sienkiewicz, Ines Bloomer, MD  triamterene-hydrochlorothiazide (MAXZIDE-25) 37.5-25 MG tablet Take 1 tablet by mouth daily. 07/07/20  Yes Horald Pollen, MD    Allergies  Allergen Reactions   Lisinopril Swelling   Peanut-Containing Drug Products Anaphylaxis, Hives, Swelling and Other (See Comments)    Pistachio's - swelling of the throat      Tramadol Nausea And Vomiting    Headache & dizziness with n&v    Patient Active Problem List   Diagnosis Date Noted   Protrusion of cervical intervertebral disc 10/21/2020   Spinal stenosis of cervical region 10/21/2020   Chronic left shoulder pain 07/07/2020   Dyslipidemia associated with type 2 diabetes mellitus ([place]) 09/06/2017   Diverticulosis of colon without hemorrhage 03/09/2015   Hyperlipidemia 03/09/2015   Gastroesophageal reflux disease without esophagitis 03/09/2015   Hypertension associated with diabetes ([place]) 04/01/2013    Past Medical History:  Diagnosis Date   Allergy    Asthma    Bronchitis    Cataract    Diverticulosis    DM2 (diabetes mellitus, type 2) (HCC)    GERD (gastroesophageal reflux disease)    Heart murmur    Hyperplastic colon polyp    Hypertension  Past Surgical History:  Procedure Laterality Date   CESAREAN SECTION     COLONOSCOPY     ESOPHAGOGASTRODUODENOSCOPY     TONSILLECTOMY      Social History   Socioeconomic History   Marital status: Single    Spouse name: Not on file   Number of children: 1   Years of education: Not on file   Highest education level: Not on file  Occupational History   Occupation: Educator  Tobacco Use   Smoking status: Never   Smokeless tobacco: Never  Vaping Use    Vaping Use: Never used  Substance and Sexual Activity   Alcohol use: No   Drug use: No   Sexual activity: Yes  Other Topics Concern   Not on file  Social History Narrative   She reports she is single. She has 1 daughter. 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     2 caffeinated beverages daily.   No alcohol tobacco or drug use.   Social Determinants of Health   Financial Resource Strain: Low Risk    Difficulty of Paying Living Expenses: Not hard at all  Food Insecurity: No Food Insecurity   Worried About Charity fundraiser in the Last Year: Never true   [place] in the Last Year: Never true  Transportation Needs: No Transportation Needs   Lack of Transportation (Medical): No   Lack of Transportation (Non-Medical): No  Physical Activity: Sufficiently Active   Days of Exercise per Week: 5 days   Minutes of Exercise per Session: 30 min  Stress: No Stress Concern Present   Feeling of Stress : Not at all  Social Connections: Moderately Integrated   Frequency of Communication with Friends and Family: More than three times a week   Frequency of Social Gatherings with Friends and Family: More than three times a week   Attends Religious Services: More than 4 times per year   Active Member of [employer] or Organizations: Yes   Attends Music therapist: More than 4 times per year   Marital Status: Never married  Human resources officer Violence: Not At Risk   Fear of Current or Ex-Partner: No   Emotionally Abused: No   Physically Abused: No   Sexually Abused: No    Family History  Problem Relation Age of Onset   Cancer Mother        ovarian cancer   Heart disease Mother    Liver disease Mother    Hyperlipidemia Mother    Stroke Mother    Cancer Father        lung cancer   Cancer Sister    Hyperlipidemia  Sister    Cancer Sister    Hyperlipidemia Sister    Diabetes Maternal Aunt    Colon cancer Maternal Aunt        x2 aunts   Colon cancer Maternal Uncle        2 uncles   Colon cancer Paternal Grandfather    Esophageal cancer Neg Hx    Rectal cancer Neg Hx    Stomach cancer Neg Hx      Review of Systems  Constitutional: Negative.  Negative for chills and fever.  HENT: Negative.  Negative for congestion and sore throat.   Respiratory: Negative.  Negative for cough and shortness of breath.  Cardiovascular: Negative.  Negative for chest pain and palpitations.  Gastrointestinal:  Positive for abdominal pain, constipation, heartburn and nausea. Negative for blood in stool, diarrhea, melena and vomiting.  Genitourinary: Negative.  Negative for dysuria and hematuria.  Skin: Negative.  Negative for rash.  Neurological:  Negative for dizziness.  All other systems reviewed and are negative. Today's Vitals   03/23/21 0811  BP: (!) 144/86  Pulse: 85  Temp: 98.5 F (36.9 C)  TempSrc: Oral  SpO2: 98%  Weight: 143 lb (64.9 kg)  Height: 5\' 4"  (1.626 m)   Body mass index is 24.55 kg/m.   Physical Exam Vitals reviewed.  Constitutional:      Appearance: Normal appearance.  HENT:     Head: Normocephalic.     Mouth/Throat:     Mouth: Mucous membranes are moist.     Pharynx: Oropharynx is clear.  Eyes:     Extraocular Movements: Extraocular movements intact.     Pupils: Pupils are equal, round, and reactive to light.  Cardiovascular:     Rate and Rhythm: Normal rate and regular rhythm.     Pulses: Normal pulses.     Heart sounds: Murmur (Systolic 3/6 aortic area) heard.  Pulmonary:     Effort: Pulmonary effort is normal.     Breath sounds: Normal breath sounds.  Abdominal:     General: Bowel sounds are normal. There is no distension.     Palpations: Abdomen is soft. There is no mass.     Tenderness: There is no abdominal tenderness. There is no guarding or rebound.   Musculoskeletal:        General: Normal range of motion.  Skin:    General: Skin is warm and dry.     Capillary Refill: Capillary refill takes less than 2 seconds.  Neurological:     General: No focal deficit present.     Mental Status: She is alert and oriented to person, place, and time.  Psychiatric:        Mood and Affect: Mood normal.        Behavior: Behavior normal.     ASSESSMENT & PLAN: Problem List Items Addressed This Visit       Cardiovascular and Mediastinum   Hypertension associated with diabetes ([place])   Relevant Orders   CBC with Differential/Platelet   Comprehensive metabolic panel   Hemoglobin A1c     Endocrine   Dyslipidemia associated with type 2 diabetes mellitus ([place])   Relevant Orders   CBC with Differential/Platelet   Lipid panel     Other   Epigastric pain - Primary    Differential diagnosis discussed.  Upper endoscopy report from 2021 reviewed with patient. Most likely same problem, multiple erosions in the stomach. Labs done today including lipase. We will start pantoprazole 40 mg daily in the morning and Pepcid 40 mg at bedtime. Diet and nutrition discussed. Follow-up in 2 weeks.  May need GI follow-up as well.      Relevant Medications   pantoprazole (PROTONIX) 40 MG tablet   famotidine (PEPCID) 40 MG tablet   Other Relevant Orders   Lipase   Patient Instructions  Gastritis, Adult Gastritis is irritation and swelling (inflammation) of the stomach. There are two kinds of gastritis: Acute gastritis. This kind develops quickly. Chronic gastritis. This kind is much more common. It develops slowly and lasts for a long time. It is important to get help for this condition. If you do not get help, your stomach can bleed, and  you can get sores (ulcers) in your stomach. What are the causes? This condition may be caused by: Germs that get to your stomach and cause an infection. Drinking too much alcohol. Medicines you are taking. Having too  much acid in the stomach. Having a disease of the stomach. Other causes may include: An allergic reaction. Some cancer treatments (radiation). Smoking cigarettes or using products that contain nicotine or tobacco. In some cases, the cause of this condition is not known. What increases the risk? Having a disease of the intestines. Having Crohn's disease. Using aspirin or ibuprofen and other NSAIDs to treat other conditions. Stress. What are the signs or symptoms? Pain in your stomach. A burning feeling in your stomach. Feeling like you may vomit (nauseous). Vomiting or vomiting blood. Feeling too full after you eat. Weight loss. Bad breath. Blood in your poop (stool). In some cases, there are no symptoms. How is this treated? This condition is treated with medicines. The medicines that are used depend on what caused the condition. You may be given: Antibiotic medicine, if your condition was caused by an infection from germs. H2 blockers and similar medicines, if your condition was caused by too much acid in the stomach. Treatment may also include stopping the use of certain medicines, such as aspirin or ibuprofen. Follow these instructions at home: Medicines Take over-the-counter and prescription medicines only as told by your doctor. If you were prescribed an antibiotic medicine, take it as told by your doctor. Do not stop taking it even if you start to feel better. Alcohol use Do not drink alcohol if: Your doctor tells you not to drink. You are pregnant, may be pregnant, or are planning to become pregnant. If you drink alcohol: Limit your use to: 0-1 drink a day for women. 0-2 drinks a day for men. Know how much alcohol is in your drink. In the [place] one drink equals one 12 oz bottle of beer (355 mL), one 5 oz glass of wine (148 mL), or one 1 oz glass of hard liquor (44 mL). General instructions  Eat small meals often, instead of large meals. Avoid foods and drinks that  make you feel worse. Drink enough fluid to keep your pee (urine) pale yellow. Talk with your doctor about ways to manage stress. You can exercise or do deep breathing, meditation, or yoga. Do not smoke or use any products that contain nicotine or tobacco. If you need help quitting, ask your doctor. Keep all follow-up visits. Contact a doctor if: Your symptoms get worse. Your stomach pain gets worse. Your symptoms go away and then come back. You have a fever. Get help right away if: You vomit blood or something that looks like coffee grounds. You have black or dark red poop. You throw up any time you try to drink fluids. These symptoms may be an emergency. Get help right away. Call your local emergency services (911 in the [place] Do not wait to see if the symptoms will go away. Do not drive yourself to the hospital. Summary Gastritis is irritation and swelling (inflammation) of the stomach. You must get help for this condition. If you do not get help, your stomach can bleed, and you can get sores (ulcers) in your stomach. You can be treated with medicines for germs or medicines to block too much acid in your stomach. This information is not intended to replace advice given to you by your health care provider. Make sure you discuss any questions you have with  your health care provider. Document Revised: 05/23/2020 Document Reviewed: 05/23/2020 Elsevier Patient Education  2022 [place], MD [facility]

## 2021-04-06 ENCOUNTER — Ambulatory Visit (INDEPENDENT_AMBULATORY_CARE_PROVIDER_SITE_OTHER): Payer: PPO | Admitting: Emergency Medicine

## 2021-04-06 ENCOUNTER — Other Ambulatory Visit: Payer: Self-pay

## 2021-04-06 ENCOUNTER — Encounter: Payer: Self-pay | Admitting: Emergency Medicine

## 2021-04-06 VITALS — BP 128/86 | HR 92 | Temp 98.4°F | Ht 64.0 in | Wt 144.0 lb

## 2021-04-06 DIAGNOSIS — I152 Hypertension secondary to endocrine disorders: Secondary | ICD-10-CM

## 2021-04-06 DIAGNOSIS — R1013 Epigastric pain: Secondary | ICD-10-CM

## 2021-04-06 DIAGNOSIS — E785 Hyperlipidemia, unspecified: Secondary | ICD-10-CM | POA: Diagnosis not present

## 2021-04-06 DIAGNOSIS — E1169 Type 2 diabetes mellitus with other specified complication: Secondary | ICD-10-CM

## 2021-04-06 DIAGNOSIS — E1159 Type 2 diabetes mellitus with other circulatory complications: Secondary | ICD-10-CM | POA: Diagnosis not present

## 2021-04-06 NOTE — Progress Notes (Signed)
Erin Benitez 71 y.o.   Chief Complaint  Patient presents with   Abdominal Pain    F/u    HISTORY OF PRESENT ILLNESS: This is a 71 y.o. female here for 2-week follow-up of epigastric pain. Much improved on pantoprazole.  Took Pepcid for 2 weeks as well. Asymptomatic today. Had swollen lymph nodes on left side of the neck last week but better today.  Did have some sore throat and mild flulike symptoms at the time.  Much improved today. Blood work showed abnormal lipid profile with elevated cholesterol.  Not taking rosuvastatin on a consistent basis. Hemoglobin A1c and glucose were elevated.  Hemoglobin A1c at 7.0.  History of diabetes.  On Rybelsus and glipizide. Normal CBC.  CMP showed slight increase of alkaline phosphatase.  Normal liver enzymes.  Normal kidney function. Scheduled for CT of abdomen and pelvis next week. Advised to follow-up with GI doctor for possible upper endoscopy. No new complaints or any other medical concerns today.  Abdominal Pain Pertinent negatives include no diarrhea, fever, melena, nausea or vomiting.    Prior to Admission medications   Medication Sig Start Date End Date Taking? Authorizing Provider  albuterol (VENTOLIN HFA) 108 (90 Base) MCG/ACT inhaler Inhale 2 puffs into the lungs every 6 (six) hours as needed for wheezing (or cough). 06/23/20  Yes Amyot, Ali Lowe, NP  amLODipine (NORVASC) 10 MG tablet Take 1 tablet (10 mg total) by mouth daily. 07/07/20  Yes Keonta Monceaux, Eilleen Kempf, MD  famotidine (PEPCID) 40 MG tablet Take 1 tablet (40 mg total) by mouth at bedtime for 14 days. 03/23/21 04/06/21 Yes Keanu Lesniak, Eilleen Kempf, MD  gabapentin (NEURONTIN) 300 MG capsule Take 1-2 capsules (300-600 mg total) by mouth at bedtime. 07/07/20  Yes Makaylyn Sinyard, Eilleen Kempf, MD  glipiZIDE (GLUCOTROL) 10 MG tablet Take 1 tablet (10 mg total) by mouth daily before breakfast. 07/07/20  Yes Nicolasa Milbrath, Eilleen Kempf, MD  metoprolol succinate (TOPROL-XL) 25 MG 24 hr tablet Take 1 tablet  (25 mg total) by mouth daily. 07/07/20  Yes Lilyrose Tanney, Eilleen Kempf, MD  nortriptyline (PAMELOR) 25 MG capsule Take 1 capsule (25 mg total) by mouth at bedtime. 10/31/19  Yes Lezlie Lye, Meda Coffee, MD  pantoprazole (PROTONIX) 40 MG tablet Take 1 tablet (40 mg total) by mouth daily. 03/23/21  Yes Diangelo Radel, Eilleen Kempf, MD  rosuvastatin (CRESTOR) 40 MG tablet Take 1 tablet (40 mg total) by mouth daily. 07/07/20  Yes Jinna Weinman, Eilleen Kempf, MD  Semaglutide 7 MG TABS Take 7 mg by mouth daily. 01/06/21  Yes Tye Juarez, Eilleen Kempf, MD  triamterene-hydrochlorothiazide (MAXZIDE-25) 37.5-25 MG tablet Take 1 tablet by mouth daily. 07/07/20  Yes Georgina Quint, MD    Allergies  Allergen Reactions   Lisinopril Swelling   Peanut-Containing Drug Products Anaphylaxis, Hives, Swelling and Other (See Comments)    Pistachio's - swelling of the throat      Tramadol Nausea And Vomiting    Headache & dizziness with n&v    Patient Active Problem List   Diagnosis Date Noted   Epigastric pain 03/23/2021   Protrusion of cervical intervertebral disc 10/21/2020   Spinal stenosis of cervical region 10/21/2020   Chronic left shoulder pain 07/07/2020   Dyslipidemia associated with type 2 diabetes mellitus (HCC) 09/06/2017   Diverticulosis of colon without hemorrhage 03/09/2015   Hyperlipidemia 03/09/2015   Gastroesophageal reflux disease without esophagitis 03/09/2015   Hypertension associated with diabetes (HCC) 04/01/2013    Past Medical History:  Diagnosis Date   Allergy  Asthma    Bronchitis    Cataract    Diverticulosis    DM2 (diabetes mellitus, type 2) (HCC)    GERD (gastroesophageal reflux disease)    Heart murmur    Hyperplastic colon polyp    Hypertension     Past Surgical History:  Procedure Laterality Date   CESAREAN SECTION     COLONOSCOPY     ESOPHAGOGASTRODUODENOSCOPY     TONSILLECTOMY      Social History   Socioeconomic History   Marital status: Single     Spouse name: Not on file   Number of children: 1   Years of education: Not on file   Highest education level: Not on file  Occupational History   Occupation: Educator  Tobacco Use   Smoking status: Never   Smokeless tobacco: Never  Vaping Use   Vaping Use: Never used  Substance and Sexual Activity   Alcohol use: No   Drug use: No   Sexual activity: Yes  Other Topics Concern   Not on file  Social History Narrative   She reports she is single. She has 1 daughter. She is an Programmer, systems.  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     2 caffeinated beverages daily.   No alcohol tobacco or drug use.   Social Determinants of Health   Financial Resource Strain: Low Risk    Difficulty of Paying Living Expenses: Not hard at all  Food Insecurity: No Food Insecurity   Worried About Programme researcher, broadcasting/film/video in the Last Year: Never true   Ran Out of Food in the Last Year: Never true  Transportation Needs: No Transportation Needs   Lack of Transportation (Medical): No   Lack of Transportation (Non-Medical): No  Physical Activity: Sufficiently Active   Days of Exercise per Week: 5 days   Minutes of Exercise per Session: 30 min  Stress: No Stress Concern Present   Feeling of Stress : Not at all  Social Connections: Moderately Integrated   Frequency of Communication with Friends and Family: More than three times a week   Frequency of Social Gatherings with Friends and Family: More than three times a week   Attends Religious Services: More than 4 times per year   Active Member of [employer] or Organizations: Yes   Attends Engineer, structural: More than 4 times per year   Marital Status: Never married  Catering manager Violence: Not At Risk   Fear of Current or Ex-Partner: No   Emotionally Abused: No   Physically Abused: No    Sexually Abused: No    Family History  Problem Relation Age of Onset   Cancer Mother        ovarian cancer   Heart disease Mother    Liver disease Mother    Hyperlipidemia Mother    Stroke Mother    Cancer Father        lung cancer   Cancer Sister    Hyperlipidemia Sister    Cancer Sister    Hyperlipidemia Sister    Diabetes Maternal Aunt    Colon cancer Maternal Aunt        x2 aunts   Colon cancer Maternal Uncle        2 uncles   Colon cancer Paternal Grandfather    Esophageal cancer Neg Hx    Rectal cancer Neg  Hx    Stomach cancer Neg Hx      Review of Systems  Constitutional: Negative.  Negative for chills and fever.  HENT: Negative.  Negative for congestion and sore throat.   Respiratory: Negative.  Negative for cough and shortness of breath.   Cardiovascular: Negative.  Negative for chest pain and palpitations.  Gastrointestinal:  Negative for abdominal pain, blood in stool, diarrhea, melena, nausea and vomiting.  Genitourinary: Negative.   Skin: Negative.  Negative for rash.  Neurological: Negative.  Negative for dizziness.  Today's Vitals   04/06/21 0912  BP: 128/86  Pulse: 92  Temp: 98.4 F (36.9 C)  TempSrc: Oral  SpO2: 96%  Weight: 144 lb (65.3 kg)  Height: 5\' 4"  (1.626 m)   Body mass index is 24.72 kg/m.   Physical Exam Vitals reviewed.  Constitutional:      Appearance: Normal appearance.  HENT:     Head: Normocephalic.  Eyes:     Extraocular Movements: Extraocular movements intact.     Pupils: Pupils are equal, round, and reactive to light.  Cardiovascular:     Rate and Rhythm: Normal rate.  Pulmonary:     Effort: Pulmonary effort is normal.  Abdominal:     Palpations: Abdomen is soft.     Tenderness: There is no abdominal tenderness.  Musculoskeletal:        General: Normal range of motion.     Cervical back: No tenderness.  Lymphadenopathy:     Cervical: No cervical adenopathy.  Skin:    General: Skin is  warm and dry.     Capillary Refill: Capillary refill takes less than 2 seconds.  Neurological:     General: No focal deficit present.     Mental Status: She is alert and oriented to person, place, and time.  Psychiatric:        Mood and Affect: Mood normal.        Behavior: Behavior normal.    Lab Results  Component Value Date   HGBA1C 7.0 (H) 03/23/2021    ASSESSMENT & PLAN: Problem List Items Addressed This Visit       Cardiovascular and Mediastinum   Hypertension associated with diabetes (HCC)    Well-controlled hypertension BP Readings from Last 3 Encounters:  04/06/21 128/86  03/23/21 (!) 144/86  01/06/21 134/76  Continue amlodipine 10 mg and Maxide.  Also continue metoprolol succinate 25 mg daily. Stable diabetes with hemoglobin A1c at 7.0.  However this is higher than before. Diet and nutrition discussed.  Continue Rybelsus 7 mg daily and glipizide 10 mg twice a day.  Hypoglycemia precautions given. Follow-up as scheduled next June.         Endocrine   Dyslipidemia associated with type 2 diabetes mellitus (HCC)    Stable.  Not taking rosuvastatin on a daily basis. Abnormal recent lipid profile. The 10-year ASCVD risk score (Arnett DK, et al., 2019) is: 30.2%   Values used to calculate the score:     Age: 77 years     Sex: Female     Is Non-Hispanic African American: Yes     Diabetic: Yes     Tobacco smoker: No     Systolic Blood Pressure: 128 mmHg     Is BP treated: Yes     HDL Cholesterol: 48.8 mg/dL     Total Cholesterol: 241 mg/dL Must take rosuvastatin 40 mg daily.        Other   Epigastric pain - Primary    Resolved.  Continue pantoprazole 40 mg daily. Finished course of Pepcid. Follow-up with GI doctor for possible repeat upper endoscopy.      Patient Instructions  Health Maintenance After Age 23 After age 37, you are at a higher risk for certain long-term diseases and infections as well as injuries from falls. Falls are a major cause of  broken bones and head injuries in people who are older than age 49. Getting regular preventive care can help to keep you healthy and well. Preventive care includes getting regular testing and making lifestyle changes as recommended by your health care provider. Talk with your health care provider about: Which screenings and tests you should have. A screening is a test that checks for a disease when you have no symptoms. A diet and exercise plan that is right for you. What should I know about screenings and tests to prevent falls? Screening and testing are the best ways to find a health problem early. Early diagnosis and treatment give you the best chance of managing medical conditions that are common after age 59. Certain conditions and lifestyle choices may make you more likely to have a fall. Your health care provider may recommend: Regular vision checks. Poor vision and conditions such as cataracts can make you more likely to have a fall. If you wear glasses, make sure to get your prescription updated if your vision changes. Medicine review. Work with your health care provider to regularly review all of the medicines you are taking, including over-the-counter medicines. Ask your health care provider about any side effects that may make you more likely to have a fall. Tell your health care provider if any medicines that you take make you feel dizzy or sleepy. Strength and balance checks. Your health care provider may recommend certain tests to check your strength and balance while standing, walking, or changing positions. Foot health exam. Foot pain and numbness, as well as not wearing proper footwear, can make you more likely to have a fall. Screenings, including: Osteoporosis screening. Osteoporosis is a condition that causes the bones to get weaker and break more easily. Blood pressure screening. Blood pressure changes and medicines to control blood pressure can make you feel dizzy. Depression  screening. You may be more likely to have a fall if you have a fear of falling, feel depressed, or feel unable to do activities that you used to do. Alcohol use screening. Using too much alcohol can affect your balance and may make you more likely to have a fall. Follow these instructions at home: Lifestyle Do not drink alcohol if: Your health care provider tells you not to drink. If you drink alcohol: Limit how much you have to: 0-1 drink a day for women. 0-2 drinks a day for men. Know how much alcohol is in your drink. In the [place] one drink equals one 12 oz bottle of beer (355 mL), one 5 oz glass of wine (148 mL), or one 1 oz glass of hard liquor (44 mL). Do not use any products that contain nicotine or tobacco. These products include cigarettes, chewing tobacco, and vaping devices, such as e-cigarettes. If you need help quitting, ask your health care provider. Activity  Follow a regular exercise program to stay fit. This will help you maintain your balance. Ask your health care provider what types of exercise are appropriate for you. If you need a cane or walker, use it as recommended by your health care provider. Wear supportive shoes that have nonskid soles. Safety  Remove  any tripping hazards, such as rugs, cords, and clutter. Install safety equipment such as grab bars in bathrooms and safety rails on stairs. Keep rooms and walkways well-lit. General instructions Talk with your health care provider about your risks for falling. Tell your health care provider if: You fall. Be sure to tell your health care provider about all falls, even ones that seem minor. You feel dizzy, tiredness (fatigue), or off-balance. Take over-the-counter and prescription medicines only as told by your health care provider. These include supplements. Eat a healthy diet and maintain a healthy weight. A healthy diet includes low-fat dairy products, low-fat (lean) meats, and fiber from whole grains, beans, and  lots of fruits and vegetables. Stay current with your vaccines. Schedule regular health, dental, and eye exams. Summary Having a healthy lifestyle and getting preventive care can help to protect your health and wellness after age 46. Screening and testing are the best way to find a health problem early and help you avoid having a fall. Early diagnosis and treatment give you the best chance for managing medical conditions that are more common for people who are older than age 36. Falls are a major cause of broken bones and head injuries in people who are older than age 57. Take precautions to prevent a fall at home. Work with your health care provider to learn what changes you can make to improve your health and wellness and to prevent falls. This information is not intended to replace advice given to you by your health care provider. Make sure you discuss any questions you have with your health care provider. Document Revised: 06/08/2020 Document Reviewed: 06/08/2020 Elsevier Patient Education  2022 Elsevier Inc.     Edwina Barth, MD [facility]

## 2021-04-06 NOTE — Patient Instructions (Signed)
Health Maintenance After Age 71 After age 71, you are at a higher risk for certain long-term diseases and infections as well as injuries from falls. Falls are a major cause of broken bones and head injuries in people who are older than age 71. Getting regular preventive care can help to keep you healthy and well. Preventive care includes getting regular testing and making lifestyle changes as recommended by your health care provider. Talk with your health care provider about: Which screenings and tests you should have. A screening is a test that checks for a disease when you have no symptoms. A diet and exercise plan that is right for you. What should I know about screenings and tests to prevent falls? Screening and testing are the best ways to find a health problem early. Early diagnosis and treatment give you the best chance of managing medical conditions that are common after age 71. Certain conditions and lifestyle choices may make you more likely to have a fall. Your health care provider may recommend: Regular vision checks. Poor vision and conditions such as cataracts can make you more likely to have a fall. If you wear glasses, make sure to get your prescription updated if your vision changes. Medicine review. Work with your health care provider to regularly review all of the medicines you are taking, including over-the-counter medicines. Ask your health care provider about any side effects that may make you more likely to have a fall. Tell your health care provider if any medicines that you take make you feel dizzy or sleepy. Strength and balance checks. Your health care provider may recommend certain tests to check your strength and balance while standing, walking, or changing positions. Foot health exam. Foot pain and numbness, as well as not wearing proper footwear, can make you more likely to have a fall. Screenings, including: Osteoporosis screening. Osteoporosis is a condition that causes  the bones to get weaker and break more easily. Blood pressure screening. Blood pressure changes and medicines to control blood pressure can make you feel dizzy. Depression screening. You may be more likely to have a fall if you have a fear of falling, feel depressed, or feel unable to do activities that you used to do. Alcohol use screening. Using too much alcohol can affect your balance and may make you more likely to have a fall. Follow these instructions at home: Lifestyle Do not drink alcohol if: Your health care provider tells you not to drink. If you drink alcohol: Limit how much you have to: 0-1 drink a day for women. 0-2 drinks a day for men. Know how much alcohol is in your drink. In the [place] one drink equals one 12 oz bottle of beer (355 mL), one 5 oz glass of wine (148 mL), or one 1 oz glass of hard liquor (44 mL). Do not use any products that contain nicotine or tobacco. These products include cigarettes, chewing tobacco, and vaping devices, such as e-cigarettes. If you need help quitting, ask your health care provider. Activity  Follow a regular exercise program to stay fit. This will help you maintain your balance. Ask your health care provider what types of exercise are appropriate for you. If you need a cane or walker, use it as recommended by your health care provider. Wear supportive shoes that have nonskid soles. Safety  Remove any tripping hazards, such as rugs, cords, and clutter. Install safety equipment such as grab bars in bathrooms and safety rails on stairs. Keep rooms and walkways   well-lit. General instructions Talk with your health care provider about your risks for falling. Tell your health care provider if: You fall. Be sure to tell your health care provider about all falls, even ones that seem minor. You feel dizzy, tiredness (fatigue), or off-balance. Take over-the-counter and prescription medicines only as told by your health care provider. These include  supplements. Eat a healthy diet and maintain a healthy weight. A healthy diet includes low-fat dairy products, low-fat (lean) meats, and fiber from whole grains, beans, and lots of fruits and vegetables. Stay current with your vaccines. Schedule regular health, dental, and eye exams. Summary Having a healthy lifestyle and getting preventive care can help to protect your health and wellness after age 71. Screening and testing are the best way to find a health problem early and help you avoid having a fall. Early diagnosis and treatment give you the best chance for managing medical conditions that are more common for people who are older than age 71. Falls are a major cause of broken bones and head injuries in people who are older than age 71. Take precautions to prevent a fall at home. Work with your health care provider to learn what changes you can make to improve your health and wellness and to prevent falls. This information is not intended to replace advice given to you by your health care provider. Make sure you discuss any questions you have with your health care provider. Document Revised: 06/08/2020 Document Reviewed: 06/08/2020 Elsevier Patient Education  2022 Elsevier Inc.  

## 2021-04-06 NOTE — Assessment & Plan Note (Signed)
Resolved.  Continue pantoprazole 40 mg daily. ?Finished course of Pepcid. ?Follow-up with GI doctor for possible repeat upper endoscopy. ?

## 2021-04-06 NOTE — Assessment & Plan Note (Signed)
Stable.  Not taking rosuvastatin on a daily basis. ?Abnormal recent lipid profile. ?The 10-year ASCVD risk score (Arnett DK, et al., 2019) is: 30.2% ?  Values used to calculate the score: ?    Age: 71 years ?    Sex: Female ?    Is Non-Hispanic African American: Yes ?    Diabetic: Yes ?    Tobacco smoker: No ?    Systolic Blood Pressure: 701 mmHg ?    Is BP treated: Yes ?    HDL Cholesterol: 48.8 mg/dL ?    Total Cholesterol: 241 mg/dL ?Must take rosuvastatin 40 mg daily. ?

## 2021-04-06 NOTE — Assessment & Plan Note (Signed)
Well-controlled hypertension ?BP Readings from Last 3 Encounters:  ?04/06/21 128/86  ?03/23/21 (!) 144/86  ?01/06/21 134/76  ?Continue amlodipine 10 mg and Maxide.  Also continue metoprolol succinate 25 mg daily. ?Stable diabetes with hemoglobin A1c at 7.0.  However this is higher than before. ?Diet and nutrition discussed.  Continue Rybelsus 7 mg daily and glipizide 10 mg twice a day.  Hypoglycemia precautions given. ?Follow-up as scheduled next June. ? ?

## 2021-04-15 ENCOUNTER — Other Ambulatory Visit: Payer: Self-pay

## 2021-04-15 ENCOUNTER — Ambulatory Visit
Admission: RE | Admit: 2021-04-15 | Discharge: 2021-04-15 | Disposition: A | Discharge status: Home / Self Care | Payer: PPO | Source: Ambulatory Visit | Attending: Emergency Medicine | Admitting: Emergency Medicine

## 2021-04-15 DIAGNOSIS — R1013 Epigastric pain: Secondary | ICD-10-CM | POA: Diagnosis not present

## 2021-04-29 DIAGNOSIS — H40013 Open angle with borderline findings, low risk, bilateral: Secondary | ICD-10-CM | POA: Diagnosis not present

## 2021-05-13 ENCOUNTER — Encounter: Payer: Self-pay | Admitting: Internal Medicine

## 2021-05-13 ENCOUNTER — Ambulatory Visit: Payer: PPO | Admitting: Internal Medicine

## 2021-05-13 ENCOUNTER — Other Ambulatory Visit (INDEPENDENT_AMBULATORY_CARE_PROVIDER_SITE_OTHER): Payer: PPO

## 2021-05-13 VITALS — BP 140/86 | HR 91 | Ht 64.0 in | Wt 142.8 lb

## 2021-05-13 DIAGNOSIS — R748 Abnormal levels of other serum enzymes: Secondary | ICD-10-CM | POA: Diagnosis not present

## 2021-05-13 DIAGNOSIS — R1013 Epigastric pain: Secondary | ICD-10-CM

## 2021-05-13 LAB — HEPATIC FUNCTION PANEL
ALT: 34 U/L (ref 0–35)
AST: 30 U/L (ref 0–37)
Albumin: 4.6 g/dL (ref 3.5–5.2)
Alkaline Phosphatase: 116 U/L (ref 39–117)
Bilirubin, Direct: 0.1 mg/dL (ref 0.0–0.3)
Total Bilirubin: 0.6 mg/dL (ref 0.2–1.2)
Total Protein: 7.9 g/dL (ref 6.0–8.3)

## 2021-05-13 LAB — GAMMA GT: GGT: 29 U/L (ref 7–51)

## 2021-05-13 NOTE — Patient Instructions (Addendum)
Your provider has requested that you go to the basement level for lab work before leaving today. Press "B" on the elevator. The lab is located at the first door on the left as you exit the elevator. ? ?Due to recent changes in healthcare laws, you may see the results of your imaging and laboratory studies on MyChart before your provider has had a chance to review them.  We understand that in some cases there may be results that are confusing or concerning to you. Not all laboratory results come back in the same time frame and the provider may be waiting for multiple results in order to interpret others.  Please give [place] 48 hours in order for your provider to thoroughly review all the results before contacting the office for clarification of your results.  ? ?Stop Goody or BC powders. ? ?You have been scheduled for an abdominal ultrasound at [facility] on 05/17/2021 at 8:30am. Please arrive 20 minutes prior to your appointment for registration. Make certain not to have anything to eat or drink 8 hours prior to your appointment. Should you need to reschedule your appointment, please contact radiology at [phone]. This test typically takes about 30 minutes to perform. ? ?Go back to your PCP to sort out your medicines.  ? ?I appreciate the opportunity to care for you. ?Silvano Rusk, MD, [facility] ? ?

## 2021-05-13 NOTE — Progress Notes (Signed)
? ?Erin Benitez 71 y.o. September 15, 1950 [id number] ? ?Assessment & Plan:  ? ?Encounter Diagnoses  ?Name Primary?  ? Abdominal pain, epigastric Yes  ? Elevated alkaline phosphatase level   ? ? ? ?Cause of pain could be her gastritis issues, also Rybelsus could have been doing this (she has currently stopped it so that raises questions about whether the PPI helped or the Rybelsus was causing it), I think gallstones or biliary issue could be in play also.  She has a chronic hyperdense area in the liver seen on CT scan but I do not think that is related to this.  I do not think a repeat EGD would be useful at this point based upon everything we know.  She has had problems off and on times a number of years which is reassuring with respect to benign etiology but unfortunate that she continues to suffer. ? ?So I will get these lab tests, her hemoglobin A1c was 7%, I am not sure why she stopped the Rybelsus it sounds like she thought since she was better with a lower A1c before she has not been taking it all the time.  We cannot clearly correlate symptoms with with that based upon history today but it is possible.  She will need to regroup with primary care regarding diabetes care at some point.  Be aware that she is not really taking the medications as listed on her med list.  Diabetic neuropathy could be in play as well perhaps. ? ?BC/Goody use could play a role and was advised to stop. ? ?Orders included right upper quadrant ultrasound, LFTs, 5 prime nucleotidase and GGT ? ?Consider antisasm agent like dicyclomine. ? ? ?CC: Horald Pollen, MD ? ? ?Subjective:  ? ?Chief Complaint: Epigastric pain ? ?HPI ?71 year old African-American woman with a history of recurrent epigastric pain, last seen in May 2021 at which point she had some erosive gastritis and I had her increase generic Nexium from 20 to 40 mg.  She did that by taking 2 over-the-counter Nexium daily.  She started having epigastric pain problems again,  saw Dr. Mitchel Honour and he changed her to pantoprazole 40 mg daily and Pepcid at night.  She followed up in March and was improved.  In February she had a mildly elevated alk phos. ? ?Lab Results  ?Component Value Date  ? ALT 26 03/23/2021  ? AST 22 03/23/2021  ? ALKPHOS 125 (H) 03/23/2021  ? BILITOT 0.4 03/23/2021  ?CBC was normal.  CT scan was done 04/17/2021 she had right nephrolithiasis and a chronic unchanged hyperdense area in the liver segment 4B.  In the past alk phos has been normal.  She been having some cough as well she wondered if that was related to reflux with what she had read online.  Dr. Mitchel Honour wanted me to see her to consider whether she would need an EGD again.  She has used BC or Goody powders 1-2x/week for abd pain (helped) ? ? ?Today she tells me she has been on and off her diabetes medicines moving them around and has been off Rybelsus for some time a month or more.  She says she stopped it because she thought her blood sugars were better. ? ?In addition to EGD in May 2021 she had a colonoscopy which showed diverticulosis no polyps plans for a follow-up 5 years after that due to multiple second-degree relatives with colon cancer.  Defecation is regular.  There is some occasional bloating no constipation. ? ? ? ?  Wt Readings from Last 3 Encounters:  ?05/13/21 142 lb 12.8 oz (64.8 kg)  ?04/06/21 144 lb (65.3 kg)  ?03/23/21 143 lb (64.9 kg)  ?155 pounds July 2021 hemoglobin A1c was above 8 in September 2021 ? ? ?Allergies  ?Allergen Reactions  ? Lisinopril Swelling  ? Peanut-Containing Drug Products Anaphylaxis, Hives, Swelling and Other (See Comments)  ?  Pistachio's - swelling of the throat   ?  ? Tramadol Nausea And Vomiting  ?  Headache & dizziness with n&v  ? ?Current Meds  ?Medication Sig  ? albuterol (VENTOLIN HFA) 108 (90 Base) MCG/ACT inhaler Inhale 2 puffs into the lungs every 6 (six) hours as needed for wheezing (or cough).  ? amLODipine (NORVASC) 10 MG tablet Take 1 tablet (10 mg total)  by mouth daily.  ? gabapentin (NEURONTIN) 300 MG capsule Take 1-2 capsules (300-600 mg total) by mouth at bedtime.  ? glipiZIDE (GLUCOTROL) 10 MG tablet Take 1 tablet (10 mg total) by mouth daily before breakfast.  ? metoprolol succinate (TOPROL-XL) 25 MG 24 hr tablet Take 1 tablet (25 mg total) by mouth daily.  ? nortriptyline (PAMELOR) 25 MG capsule Take 1 capsule (25 mg total) by mouth at bedtime.  ? pantoprazole (PROTONIX) 40 MG tablet Take 1 tablet (40 mg total) by mouth daily.  ? rosuvastatin (CRESTOR) 40 MG tablet Take 1 tablet (40 mg total) by mouth daily.  ? Semaglutide 7 MG TABS Take 7 mg by mouth daily.  ? triamterene-hydrochlorothiazide (MAXZIDE-25) 37.5-25 MG tablet Take 1 tablet by mouth daily.  ? ?Past Medical History:  ?Diagnosis Date  ? Allergy   ? Asthma   ? Bronchitis   ? Cataract   ? Diverticulosis   ? DM2 (diabetes mellitus, type 2) ([place])   ? GERD (gastroesophageal reflux disease)   ? Heart murmur   ? Hyperplastic colon polyp   ? Hypertension   ? ?Past Surgical History:  ?Procedure Laterality Date  ? CESAREAN SECTION    ? COLONOSCOPY    ? ESOPHAGOGASTRODUODENOSCOPY    ? TONSILLECTOMY    ? ?Social History  ? ?Social History Narrative  ? She reports she is single. She has 1 daughter. 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    ? 2 caffeinated beverages daily.  ? No alcohol tobacco or drug use.  ? ?family history includes Cancer in her father, mother, sister, and sister; Colon cancer in her maternal aunt, maternal uncle, and paternal grandfather; Diabetes in her maternal aunt; Heart disease in her mother; Hyperlipidemia in her mother, sister, and sister; Liver disease in her mother; Stroke in her mother. ? ? ?Review of Systems ? ?As per HPI ?Objective:  ? Physical Exam ?BP 140/86   Pulse 91   Ht 5' 4"  (1.626 m)   Wt 142 lb 12.8 oz (64.8  kg)   SpO2 98%   BMI 24.51 kg/m?  ?Well-developed well-nourished black woman in no acute distress ?Abdomen is soft bowel sounds present no succussion splash she is nontender without organomegaly or mass.  There are transmitted heart sounds but no bruits. ? ?

## 2021-05-17 ENCOUNTER — Other Ambulatory Visit: Payer: PPO

## 2021-05-18 ENCOUNTER — Ambulatory Visit
Admission: RE | Admit: 2021-05-18 | Discharge: 2021-05-18 | Disposition: A | Discharge status: Home / Self Care | Payer: PPO | Source: Ambulatory Visit | Attending: Internal Medicine | Admitting: Internal Medicine

## 2021-05-18 DIAGNOSIS — R1013 Epigastric pain: Secondary | ICD-10-CM

## 2021-05-18 DIAGNOSIS — K760 Fatty (change of) liver, not elsewhere classified: Secondary | ICD-10-CM | POA: Diagnosis not present

## 2021-05-20 LAB — NUCLEOTIDASE, 5', BLOOD: 5-Nucleotidase: 3 U/L (ref 0–10)

## 2021-06-09 ENCOUNTER — Ambulatory Visit: Payer: PPO | Admitting: Internal Medicine

## 2021-06-09 ENCOUNTER — Encounter: Payer: Self-pay | Admitting: Internal Medicine

## 2021-06-09 VITALS — BP 138/86 | HR 81 | Ht 64.0 in | Wt 144.0 lb

## 2021-06-09 DIAGNOSIS — R1013 Epigastric pain: Secondary | ICD-10-CM | POA: Diagnosis not present

## 2021-06-09 DIAGNOSIS — R748 Abnormal levels of other serum enzymes: Secondary | ICD-10-CM | POA: Diagnosis not present

## 2021-06-09 DIAGNOSIS — K760 Fatty (change of) liver, not elsewhere classified: Secondary | ICD-10-CM

## 2021-06-09 DIAGNOSIS — T887XXA Unspecified adverse effect of drug or medicament, initial encounter: Secondary | ICD-10-CM

## 2021-06-09 NOTE — Progress Notes (Signed)
? ?Erin Benitez 71 y.o. 1950-03-08 [id number] ? ?Assessment & Plan:  ? ?Encounter Diagnoses  ?Name Primary?  ? NAFLD (nonalcoholic fatty liver disease) Yes  ? Abdominal pain, epigastric   ? Elevated alkaline phosphatase level   ? Medication side effect ? Rybelsus   ? ? ?Fortunately she is feeling better.  It might have been the Rybelsus.  I have advised her to restart this and if the pain returns I think we will have her answer.  She has follow-up with Dr. Mitchel Honour in early June. ? ?Because of elevated alkaline phosphatase level not clear that was transient fortunately.  GGT and 5 prime nucleotidase normal so do not think that was from the liver.  She does appear to have nonalcoholic fatty liver disease.  We had a conversation about this and I recommended she continue to try to reduce sugary sodas and also refined carbohydrates and starches like pastas and cereals etc.  Dr. Willene Hatchet fatty liver handout provided to the patient which explains the causes of fatty liver and its relationship to insulin resistance and recommends similar diet changes and exercise. ? ?Return here as needed ? ? ? ?Subjective:  ? ?Chief Complaint: Epigastric pain follow-up ? ?HPI ?The patient returns for follow-up she was seen May 13, 2021 with epigastric pain and had an elevated alkaline phosphatase.  She had been taking Rybelsus and then stopped it.  Since that time the pain is better.  She had stopped the Rybelsus prior to that visit and was still having some epigastric pain.  I had her do a right upper quadrant ultrasound which demonstrated hepatic steatosis.  At this point she feels significantly better she has been reducing carbohydrates and sugary sodas.  She is still off the Rybelsus.  She has follow-up with Dr. Mitchel Honour on June 7.  An EGD in 2021 had demonstrated some erosive gastritis.  She is on PPI still. ? ? ?Lab Results  ?Component Value Date  ? ALT 34 05/13/2021  ? AST 30 05/13/2021  ? ALKPHOS 116 05/13/2021  ? BILITOT  0.6 05/13/2021  ? ?GGT 29 ?5 prime nucleotidase 3 ? ?Wt Readings from Last 3 Encounters:  ?06/09/21 144 lb (65.3 kg)  ?05/13/21 142 lb 12.8 oz (64.8 kg)  ?04/06/21 144 lb (65.3 kg)  ? ? ?RUQ [place] 05/18/21 - Hepatic steatosis ?CLINICAL DATA:  Epigastric abdominal pain ?  ?EXAM: ?CT ABDOMEN AND PELVIS WITHOUT CONTRAST ?  ?TECHNIQUE: ?Multidetector CT imaging of the abdomen and pelvis was performed ?following the standard protocol without IV contrast. ?  ?RADIATION DOSE REDUCTION: This exam was performed according to the ?departmental dose-optimization program which includes automated ?exposure control, adjustment of the mA and/or kV according to ?patient size and/or use of iterative reconstruction technique. ?  ?COMPARISON:  03/07/2015 and previous ?  ?FINDINGS: ?Lower chest: No pleural or pericardial effusion. Visualized lung ?bases clear. ?  ?Hepatobiliary: No calcified gallstones or biliary ductal dilatation. ?Hyperdense masslike area in segment 4 B stable since 03/04/2014 ?consistent with benign process. No new liver lesion. ?  ?Pancreas: Unremarkable. No pancreatic ductal dilatation or ?surrounding inflammatory changes. ?  ?Spleen: Normal in size without focal abnormality. ?  ?Adrenals/Urinary Tract: No adrenal mass. Right nephrolithiasis, ?largest stone or cluster 7 mm in the upper pole. No hydronephrosis. ?2.2 cm fluid attenuation probable cyst lower pole right kidney; no ?follow-up recommended. Urinary bladder physiologically distended. ?  ?Stomach/Bowel: Stomach is incompletely distended, unremarkable. The ?small bowel is nondilated. Normal appendix. The colon is nondilated ?with a  few diverticula from the mid transverse segment; no adjacent ?inflammatory change. ?  ?Vascular/Lymphatic: Scattered aortoiliac calcified plaque without ?aneurysm. No abdominal or pelvic adenopathy. ?  ?Reproductive: Uterus and bilateral adnexa are unremarkable. ?  ?Other: No ascites.  No free air. ?  ?Musculoskeletal: No acute or  significant osseous findings. ?  ?IMPRESSION: ?1. No acute findings. ?2. Nonobstructive right urolithiasis. ?3. Scattered transverse colon diverticula without inflammatory ?change. ?  ?  ?Electronically Signed ?  By: Lucrezia Europe M.D. ?  On: 04/17/2021 11:34 ?Allergies  ?Allergen Reactions  ? Lisinopril Swelling  ? Peanut-Containing Drug Products Anaphylaxis, Hives, Swelling and Other (See Comments)  ?  Pistachio's - swelling of the throat   ?  ? Tramadol Nausea And Vomiting  ?  Headache & dizziness with n&v  ? ?Current Meds  ?Medication Sig  ? albuterol (VENTOLIN HFA) 108 (90 Base) MCG/ACT inhaler Inhale 2 puffs into the lungs every 6 (six) hours as needed for wheezing (or cough).  ? amLODipine (NORVASC) 10 MG tablet Take 1 tablet (10 mg total) by mouth daily.  ? gabapentin (NEURONTIN) 300 MG capsule Take 1-2 capsules (300-600 mg total) by mouth at bedtime.  ? glipiZIDE (GLUCOTROL) 10 MG tablet Take 1 tablet (10 mg total) by mouth daily before breakfast.  ? metoprolol succinate (TOPROL-XL) 25 MG 24 hr tablet Take 1 tablet (25 mg total) by mouth daily.  ? nortriptyline (PAMELOR) 25 MG capsule Take 1 capsule (25 mg total) by mouth at bedtime.  ? pantoprazole (PROTONIX) 40 MG tablet Take 1 tablet (40 mg total) by mouth daily.  ? rosuvastatin (CRESTOR) 40 MG tablet Take 1 tablet (40 mg total) by mouth daily.  ? Semaglutide 7 MG TABS Take 7 mg by mouth daily.  ? triamterene-hydrochlorothiazide (MAXZIDE-25) 37.5-25 MG tablet Take 1 tablet by mouth daily.  ? ?Past Medical History:  ?Diagnosis Date  ? Allergy   ? Asthma   ? Bronchitis   ? Cataract   ? Diverticulosis   ? DM2 (diabetes mellitus, type 2) ([place])   ? GERD (gastroesophageal reflux disease)   ? Heart murmur   ? Hyperplastic colon polyp   ? Hypertension   ? ?Past Surgical History:  ?Procedure Laterality Date  ? CESAREAN SECTION    ? COLONOSCOPY    ? ESOPHAGOGASTRODUODENOSCOPY    ? TONSILLECTOMY    ? ?Social History  ? ?Social History Narrative  ? She reports she is  single. She has 1 daughter. 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    ? 2 caffeinated beverages daily.  ? No alcohol tobacco or drug use.  ? ?family history includes Cancer in her father, mother, sister, and sister; Colon cancer in her maternal aunt, maternal uncle, and paternal grandfather; Diabetes in her maternal aunt; Heart disease in her mother; Hyperlipidemia in her mother, sister, and sister; Liver disease in her mother; Stroke in her mother. ? ? ?Review of Systems ?As above ? ?Objective:  ? Physical Exam ?BP 138/86   Pulse 81   Ht '5\' 4"'$  (1.626 m)   Wt 144 lb (65.3 kg)   SpO2 97%   BMI 24.72 kg/m?  ? ? ?

## 2021-06-09 NOTE — Patient Instructions (Signed)
Please read handout about Fatty Liver. ? ?Keep up the good work and try to eliminate sugary sodas and also reduce bread, pasta, etc. ? ? ?Go back on Rybelsus - if abdomnal pain comes back I think we will have our answer. ? ?I appreciate the opportunity to care for you. ?Gatha Mayer, MD, [facility] ? ?

## 2021-07-06 ENCOUNTER — Other Ambulatory Visit: Payer: Self-pay | Admitting: Emergency Medicine

## 2021-07-06 DIAGNOSIS — R1013 Epigastric pain: Secondary | ICD-10-CM

## 2021-07-07 ENCOUNTER — Ambulatory Visit (INDEPENDENT_AMBULATORY_CARE_PROVIDER_SITE_OTHER): Payer: PPO | Admitting: Emergency Medicine

## 2021-07-07 ENCOUNTER — Encounter: Payer: Self-pay | Admitting: Emergency Medicine

## 2021-07-07 ENCOUNTER — Ambulatory Visit (INDEPENDENT_AMBULATORY_CARE_PROVIDER_SITE_OTHER): Payer: PPO

## 2021-07-07 VITALS — BP 158/84 | HR 70 | Temp 98.0°F | Ht 64.0 in | Wt 143.1 lb

## 2021-07-07 DIAGNOSIS — R0609 Other forms of dyspnea: Secondary | ICD-10-CM | POA: Diagnosis not present

## 2021-07-07 DIAGNOSIS — E1169 Type 2 diabetes mellitus with other specified complication: Secondary | ICD-10-CM | POA: Diagnosis not present

## 2021-07-07 DIAGNOSIS — I152 Hypertension secondary to endocrine disorders: Secondary | ICD-10-CM

## 2021-07-07 DIAGNOSIS — E785 Hyperlipidemia, unspecified: Secondary | ICD-10-CM

## 2021-07-07 DIAGNOSIS — E782 Mixed hyperlipidemia: Secondary | ICD-10-CM

## 2021-07-07 DIAGNOSIS — G44229 Chronic tension-type headache, not intractable: Secondary | ICD-10-CM

## 2021-07-07 DIAGNOSIS — K760 Fatty (change of) liver, not elsewhere classified: Secondary | ICD-10-CM | POA: Insufficient documentation

## 2021-07-07 DIAGNOSIS — K219 Gastro-esophageal reflux disease without esophagitis: Secondary | ICD-10-CM

## 2021-07-07 DIAGNOSIS — E1159 Type 2 diabetes mellitus with other circulatory complications: Secondary | ICD-10-CM

## 2021-07-07 DIAGNOSIS — K5730 Diverticulosis of large intestine without perforation or abscess without bleeding: Secondary | ICD-10-CM

## 2021-07-07 DIAGNOSIS — I10 Essential (primary) hypertension: Secondary | ICD-10-CM | POA: Diagnosis not present

## 2021-07-07 DIAGNOSIS — R5383 Other fatigue: Secondary | ICD-10-CM

## 2021-07-07 LAB — POCT GLYCOSYLATED HEMOGLOBIN (HGB A1C): Hemoglobin A1C: 8.7 % — AB (ref 4.0–5.6)

## 2021-07-07 LAB — CBC WITH DIFFERENTIAL/PLATELET
Basophils Absolute: 0 10*3/uL (ref 0.0–0.1)
Basophils Relative: 0.4 % (ref 0.0–3.0)
Eosinophils Absolute: 0.1 10*3/uL (ref 0.0–0.7)
Eosinophils Relative: 3 % (ref 0.0–5.0)
HCT: 37 % (ref 36.0–46.0)
Hemoglobin: 12.1 g/dL (ref 12.0–15.0)
Lymphocytes Relative: 36.8 % (ref 12.0–46.0)
Lymphs Abs: 1.4 10*3/uL (ref 0.7–4.0)
MCHC: 32.7 g/dL (ref 30.0–36.0)
MCV: 84.2 fl (ref 78.0–100.0)
Monocytes Absolute: 0.3 10*3/uL (ref 0.1–1.0)
Monocytes Relative: 8.8 % (ref 3.0–12.0)
Neutro Abs: 2 10*3/uL (ref 1.4–7.7)
Neutrophils Relative %: 51 % (ref 43.0–77.0)
Platelets: 284 10*3/uL (ref 150.0–400.0)
RBC: 4.4 Mil/uL (ref 3.87–5.11)
RDW: 14.6 % (ref 11.5–15.5)
WBC: 3.9 10*3/uL — ABNORMAL LOW (ref 4.0–10.5)

## 2021-07-07 LAB — COMPREHENSIVE METABOLIC PANEL
ALT: 13 U/L (ref 0–35)
AST: 17 U/L (ref 0–37)
Albumin: 4.6 g/dL (ref 3.5–5.2)
Alkaline Phosphatase: 119 U/L — ABNORMAL HIGH (ref 39–117)
BUN: 15 mg/dL (ref 6–23)
CO2: 24 mEq/L (ref 19–32)
Calcium: 9.5 mg/dL (ref 8.4–10.5)
Chloride: 102 mEq/L (ref 96–112)
Creatinine, Ser: 0.65 mg/dL (ref 0.40–1.20)
GFR: 89.04 mL/min (ref >60.00)
Glucose, Bld: 123 mg/dL — ABNORMAL HIGH (ref 70–99)
Potassium: 3.3 mEq/L — ABNORMAL LOW (ref 3.5–5.1)
Sodium: 138 mEq/L (ref 135–145)
Total Bilirubin: 0.6 mg/dL (ref 0.2–1.2)
Total Protein: 8 g/dL (ref 6.0–8.3)

## 2021-07-07 LAB — LIPID PANEL
Cholesterol: 217 mg/dL — ABNORMAL HIGH (ref 0–200)
HDL: 50.4 mg/dL (ref >39.00)
LDL Cholesterol: 138 mg/dL — ABNORMAL HIGH (ref 0–99)
NonHDL: 166.21
Total CHOL/HDL Ratio: 4
Triglycerides: 141 mg/dL (ref 0.0–149.0)
VLDL: 28.2 mg/dL (ref 0.0–40.0)

## 2021-07-07 LAB — MICROALBUMIN / CREATININE URINE RATIO
Creatinine,U: 56.6 mg/dL
Microalb Creat Ratio: 6 mg/g (ref 0.0–30.0)
Microalb, Ur: 3.4 mg/dL — ABNORMAL HIGH (ref 0.0–1.9)

## 2021-07-07 MED ORDER — NORTRIPTYLINE HCL 25 MG PO CAPS: 25.0000 mg | ORAL_CAPSULE | Freq: Every day | ORAL | 1 refills | Status: DC

## 2021-07-07 MED ORDER — ROSUVASTATIN CALCIUM 40 MG PO TABS: 40.0000 mg | ORAL_TABLET | Freq: Every day | ORAL | 3 refills | Status: DC

## 2021-07-07 MED ORDER — LOSARTAN POTASSIUM 50 MG PO TABS: 50.0000 mg | ORAL_TABLET | Freq: Every day | ORAL | 3 refills | Status: DC

## 2021-07-07 MED ORDER — GLIPIZIDE 10 MG PO TABS: 10.0000 mg | ORAL_TABLET | Freq: Every day | ORAL | 1 refills | Status: DC

## 2021-07-07 MED ORDER — GABAPENTIN 300 MG PO CAPS: 300.0000 mg | ORAL_CAPSULE | Freq: Every day | ORAL | 0 refills | Status: DC

## 2021-07-07 MED ORDER — METOPROLOL SUCCINATE ER 25 MG PO TB24: 25.0000 mg | ORAL_TABLET | Freq: Every day | ORAL | 1 refills | Status: DC

## 2021-07-07 MED ORDER — AMLODIPINE BESYLATE 10 MG PO TABS: 10.0000 mg | ORAL_TABLET | Freq: Every day | ORAL | 1 refills | Status: DC

## 2021-07-07 MED ORDER — SEMAGLUTIDE 7 MG PO TABS: 7.0000 mg | ORAL_TABLET | Freq: Every day | ORAL | 1 refills | Status: DC

## 2021-07-07 NOTE — Assessment & Plan Note (Signed)
Differential diagnosis discussed. No anginal symptoms but patient is diabetic. EKG shows no acute ischemic changes but shows nonspecific T wave abnormalities and possible LVH. Chest x-ray done today to assess for cardiomegaly.  No clinical findings of congestive heart failure. Uncontrolled diabetes and hypertension.  May have hypertensive heart disease and or coronary artery disease. Needs cardiology evaluation with stress test and echocardiogram.  Referral placed today. Advised to continue daily baby aspirin, beta-blocker, and calcium channel blocker. We will add ARB today. Follow-up in 3 months.  Earlier if needed.

## 2021-07-07 NOTE — Assessment & Plan Note (Signed)
Stable.  No GI obvious bleeding.  CBC done today.

## 2021-07-07 NOTE — Assessment & Plan Note (Signed)
Stable.  Continue rosuvastatin 40 mg daily.  Lipid profile done today. The 10-year ASCVD risk score (Arnett DK, et al., 2019) is: 42%   Values used to calculate the score:     Age: 71 years     Sex: Female     Is Non-Hispanic African American: Yes     Diabetic: Yes     Tobacco smoker: No     Systolic Blood Pressure: 832 mmHg     Is BP treated: Yes     HDL Cholesterol: 48.8 mg/dL     Total Cholesterol: 241 mg/dL

## 2021-07-07 NOTE — Assessment & Plan Note (Signed)
Diet and nutrition discussed. 

## 2021-07-07 NOTE — Patient Instructions (Signed)
Hypertension, Adult High blood pressure (hypertension) is when the force of blood pumping through the arteries is too strong. The arteries are the blood vessels that carry blood from the heart throughout the body. Hypertension forces the heart to work harder to pump blood and may cause arteries to become narrow or stiff. Untreated or uncontrolled hypertension can lead to a heart attack, heart failure, a stroke, kidney disease, and other problems. A blood pressure reading consists of a higher number over a lower number. Ideally, your blood pressure should be below 120/80. The first ("top") number is called the systolic pressure. It is a measure of the pressure in your arteries as your heart beats. The second ("bottom") number is called the diastolic pressure. It is a measure of the pressure in your arteries as the heart relaxes. What are the causes? The exact cause of this condition is not known. There are some conditions that result in high blood pressure. What increases the risk? Certain factors may make you more likely to develop high blood pressure. Some of these risk factors are under your control, including: Smoking. Not getting enough exercise or physical activity. Being overweight. Having too much fat, sugar, calories, or salt (sodium) in your diet. Drinking too much alcohol. Other risk factors include: Having a personal history of heart disease, diabetes, high cholesterol, or kidney disease. Stress. Having a family history of high blood pressure and high cholesterol. Having obstructive sleep apnea. Age. The risk increases with age. What are the signs or symptoms? High blood pressure may not cause symptoms. Very high blood pressure (hypertensive crisis) may cause: Headache. Fast or irregular heartbeats (palpitations). Shortness of breath. Nosebleed. Nausea and vomiting. Vision changes. Severe chest pain, dizziness, and seizures. How is this diagnosed? This condition is diagnosed by  measuring your blood pressure while you are seated, with your arm resting on a flat surface, your legs uncrossed, and your feet flat on the floor. The cuff of the blood pressure monitor will be placed directly against the skin of your upper arm at the level of your heart. Blood pressure should be measured at least twice using the same arm. Certain conditions can cause a difference in blood pressure between your right and left arms. If you have a high blood pressure reading during one visit or you have normal blood pressure with other risk factors, you may be asked to: Return on a different day to have your blood pressure checked again. Monitor your blood pressure at home for 1 week or longer. If you are diagnosed with hypertension, you may have other blood or imaging tests to help your health care provider understand your overall risk for other conditions. How is this treated? This condition is treated by making healthy lifestyle changes, such as eating healthy foods, exercising more, and reducing your alcohol intake. You may be referred for counseling on a healthy diet and physical activity. Your health care provider may prescribe medicine if lifestyle changes are not enough to get your blood pressure under control and if: Your systolic blood pressure is above 130. Your diastolic blood pressure is above 80. Your personal target blood pressure may vary depending on your medical conditions, your age, and other factors. Follow these instructions at home: Eating and drinking  Eat a diet that is high in fiber and potassium, and low in sodium, added sugar, and fat. An example of this eating plan is called the DASH diet. DASH stands for Dietary Approaches to Stop Hypertension. To eat this way: Eat   plenty of fresh fruits and vegetables. Try to fill one half of your plate at each meal with fruits and vegetables. Eat whole grains, such as whole-wheat pasta, brown rice, or whole-grain bread. Fill about one  fourth of your plate with whole grains. Eat or drink low-fat dairy products, such as skim milk or low-fat yogurt. Avoid fatty cuts of meat, processed or cured meats, and poultry with skin. Fill about one fourth of your plate with lean proteins, such as fish, chicken without skin, beans, eggs, or tofu. Avoid pre-made and processed foods. These tend to be higher in sodium, added sugar, and fat. Reduce your daily sodium intake. Many people with hypertension should eat less than 1,500 mg of sodium a day. Do not drink alcohol if: Your health care provider tells you not to drink. You are pregnant, may be pregnant, or are planning to become pregnant. If you drink alcohol: Limit how much you have to: 0-1 drink a day for women. 0-2 drinks a day for men. Know how much alcohol is in your drink. In the [place] one drink equals one 12 oz bottle of beer (355 mL), one 5 oz glass of wine (148 mL), or one 1 oz glass of hard liquor (44 mL). Lifestyle  Work with your health care provider to maintain a healthy body weight or to lose weight. Ask what an ideal weight is for you. Get at least 30 minutes of exercise that causes your heart to beat faster (aerobic exercise) most days of the week. Activities may include walking, swimming, or biking. Include exercise to strengthen your muscles (resistance exercise), such as Pilates or lifting weights, as part of your weekly exercise routine. Try to do these types of exercises for 30 minutes at least 3 days a week. Do not use any products that contain nicotine or tobacco. These products include cigarettes, chewing tobacco, and vaping devices, such as e-cigarettes. If you need help quitting, ask your health care provider. Monitor your blood pressure at home as told by your health care provider. Keep all follow-up visits. This is important. Medicines Take over-the-counter and prescription medicines only as told by your health care provider. Follow directions carefully. Blood  pressure medicines must be taken as prescribed. Do not skip doses of blood pressure medicine. Doing this puts you at risk for problems and can make the medicine less effective. Ask your health care provider about side effects or reactions to medicines that you should watch for. Contact a health care provider if you: Think you are having a reaction to a medicine you are taking. Have headaches that keep coming back (recurring). Feel dizzy. Have swelling in your ankles. Have trouble with your vision. Get help right away if you: Develop a severe headache or confusion. Have unusual weakness or numbness. Feel faint. Have severe pain in your chest or abdomen. Vomit repeatedly. Have trouble breathing. These symptoms may be an emergency. Get help right away. Call 911. Do not wait to see if the symptoms will go away. Do not drive yourself to the hospital. Summary Hypertension is when the force of blood pumping through your arteries is too strong. If this condition is not controlled, it may put you at risk for serious complications. Your personal target blood pressure may vary depending on your medical conditions, your age, and other factors. For most people, a normal blood pressure is less than 120/80. Hypertension is treated with lifestyle changes, medicines, or a combination of both. Lifestyle changes include losing weight, eating a healthy,   low-sodium diet, exercising more, and limiting alcohol. This information is not intended to replace advice given to you by your health care provider. Make sure you discuss any questions you have with your health care provider. Document Revised: 11/24/2020 Document Reviewed: 11/24/2020 Elsevier Patient Education  2023 [employer]  

## 2021-07-07 NOTE — Progress Notes (Signed)
Erin Benitez 71 y.o.   Chief Complaint  Patient presents with   Follow-up   trouble breathing     Having trouble getting good breaths, feels as though she is struggling to breath     HISTORY OF PRESENT ILLNESS: This is a 71 y.o. female with history of diabetes and hypertension here for follow-up. During last visit she was complaining of epigastric pain.  Was referred to GI and abdomen CT scan was ordered.  Blood work showed increased alkaline phosphatase. CT scan showed diverticulosis.  No acute findings. Was seen and evaluated by GI doctor, Dr. Carlean Purl.  Office visit notes reviewed. Found to have nonalcoholic fatty liver disease.  Alkaline phosphatase not of liver origin. Rybelsus was temporarily stopped.  She restarted 3 days ago. Epigastric pain is better. However for the past 2 weeks she has been feeling tired with dyspnea on exertion and easy fatigability Denies chest pain or pressure on exertion.  Started taking baby aspirin daily. No other complaints or medical concerns today. Lab Results  Component Value Date   HGBA1C 7.0 (H) 03/23/2021     HPI   Prior to Admission medications   Medication Sig Start Date End Date Taking? Authorizing Provider  albuterol (VENTOLIN HFA) 108 (90 Base) MCG/ACT inhaler Inhale 2 puffs into the lungs every 6 (six) hours as needed for wheezing (or cough). 06/23/20   Katy Apo, NP  amLODipine (NORVASC) 10 MG tablet Take 1 tablet (10 mg total) by mouth daily. 07/07/20   Horald Pollen, MD  gabapentin (NEURONTIN) 300 MG capsule Take 1-2 capsules (300-600 mg total) by mouth at bedtime. 07/07/20   Horald Pollen, MD  glipiZIDE (GLUCOTROL) 10 MG tablet Take 1 tablet (10 mg total) by mouth daily before breakfast. 07/07/20   Horald Pollen, MD  metoprolol succinate (TOPROL-XL) 25 MG 24 hr tablet Take 1 tablet (25 mg total) by mouth daily. 07/07/20   Horald Pollen, MD  nortriptyline (PAMELOR) 25 MG capsule Take 1 capsule (25 mg  total) by mouth at bedtime. 10/31/19   Jacelyn Pi, Lilia Argue, MD  pantoprazole (PROTONIX) 40 MG tablet Take 1 tablet by mouth once daily 07/07/21   Horald Pollen, MD  rosuvastatin (CRESTOR) 40 MG tablet Take 1 tablet (40 mg total) by mouth daily. 07/07/20   Horald Pollen, MD  Semaglutide 7 MG TABS Take 7 mg by mouth daily. 01/06/21   Horald Pollen, MD  triamterene-hydrochlorothiazide (MAXZIDE-25) 37.5-25 MG tablet Take 1 tablet by mouth daily. 07/07/20   Horald Pollen, MD    Allergies  Allergen Reactions   Lisinopril Swelling   Peanut-Containing Drug Products Anaphylaxis, Hives, Swelling and Other (See Comments)    Pistachio's - swelling of the throat      Tramadol Nausea And Vomiting    Headache & dizziness with n&v    Patient Active Problem List   Diagnosis Date Noted   NAFLD (nonalcoholic fatty liver disease) 07/07/2021   Protrusion of cervical intervertebral disc 10/21/2020   Spinal stenosis of cervical region 10/21/2020   Chronic left shoulder pain 07/07/2020   Dyslipidemia associated with type 2 diabetes mellitus ([place]) 09/06/2017   Diverticulosis of colon without hemorrhage 03/09/2015   Hyperlipidemia 03/09/2015   Gastroesophageal reflux disease without esophagitis 03/09/2015   Hypertension associated with diabetes ([place]) 04/01/2013    Past Medical History:  Diagnosis Date   Allergy    Asthma    Bronchitis    Cataract    Diverticulosis  DM2 (diabetes mellitus, type 2) (HCC)    GERD (gastroesophageal reflux disease)    Heart murmur    Hyperplastic colon polyp    Hypertension     Past Surgical History:  Procedure Laterality Date   CESAREAN SECTION     COLONOSCOPY     ESOPHAGOGASTRODUODENOSCOPY     TONSILLECTOMY      Social History   Socioeconomic History   Marital status: Single    Spouse name: Not on file   Number of children: 1   Years of education: Not on file   Highest education level: Not on file  Occupational History    Occupation: Educator  Tobacco Use   Smoking status: Never   Smokeless tobacco: Never  Vaping Use   Vaping Use: Never used  Substance and Sexual Activity   Alcohol use: No   Drug use: No   Sexual activity: Yes  Other Topics Concern   Not on file  Social History Narrative   She reports she is single. She has 1 daughter. 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     2 caffeinated beverages daily.   No alcohol tobacco or drug use.   Social Determinants of Health   Financial Resource Strain: Low Risk    Difficulty of Paying Living Expenses: Not hard at all  Food Insecurity: No Food Insecurity   Worried About Charity fundraiser in the Last Year: Never true   [place] in the Last Year: Never true  Transportation Needs: No Transportation Needs   Lack of Transportation (Medical): No   Lack of Transportation (Non-Medical): No  Physical Activity: Sufficiently Active   Days of Exercise per Week: 5 days   Minutes of Exercise per Session: 30 min  Stress: No Stress Concern Present   Feeling of Stress : Not at all  Social Connections: Moderately Integrated   Frequency of Communication with Friends and Family: More than three times a week   Frequency of Social Gatherings with Friends and Family: More than three times a week   Attends Religious Services: More than 4 times per year   Active Member of [employer] or Organizations: Yes   Attends Music therapist: More than 4 times per year   Marital Status: Never married  Human resources officer Violence: Not At Risk   Fear of Current or Ex-Partner: No   Emotionally Abused: No   Physically Abused: No   Sexually Abused: No    Family History  Problem Relation Age of Onset   Cancer Mother        ovarian cancer   Heart disease Mother    Liver disease Mother     Hyperlipidemia Mother    Stroke Mother    Cancer Father        lung cancer   Cancer Sister    Hyperlipidemia Sister    Cancer Sister    Hyperlipidemia Sister    Diabetes Maternal Aunt    Colon cancer Maternal Aunt        x2 aunts   Colon cancer Maternal Uncle        2 uncles   Colon cancer Paternal Grandfather    Esophageal cancer Neg Hx    Rectal cancer Neg Hx    Stomach cancer Neg Hx      Review of Systems  Constitutional: Negative.  Negative for chills and fever.  HENT: Negative.  Negative for congestion and sore throat.   Respiratory:  Positive for shortness of breath. Negative for cough.   Cardiovascular:  Negative for chest pain and palpitations.  Gastrointestinal:  Negative for abdominal pain, nausea and vomiting.  Genitourinary: Negative.  Negative for dysuria and hematuria.  Musculoskeletal:  Negative for joint pain and myalgias.  Skin: Negative.  Negative for rash.  Neurological: Negative.  Negative for dizziness, loss of consciousness and headaches.  All other systems reviewed and are negative.  Today's Vitals   07/07/21 0818 07/07/21 0824  BP: (!) 160/84 (!) 158/84  Pulse: 70   Temp: 98 F (36.7 C)   SpO2: 98%   Weight: 143 lb 2 oz (64.9 kg)   Height: '5\' 4"'$  (1.626 m)    Body mass index is 24.57 kg/m.  Physical Exam Vitals reviewed.  Constitutional:      Appearance: Normal appearance.  HENT:     Head: Normocephalic.     Mouth/Throat:     Mouth: Mucous membranes are moist.     Pharynx: Oropharynx is clear.  Eyes:     Extraocular Movements: Extraocular movements intact.     Pupils: Pupils are equal, round, and reactive to light.  Cardiovascular:     Rate and Rhythm: Normal rate and regular rhythm.     Heart sounds: Murmur heard.  Pulmonary:     Effort: Pulmonary effort is normal.     Breath sounds: Normal breath sounds.  Abdominal:     Palpations: Abdomen is soft.     Tenderness: There is no abdominal tenderness.  Musculoskeletal:         General: Normal range of motion.     Cervical back: No tenderness.     Right lower leg: No edema.     Left lower leg: No edema.  Lymphadenopathy:     Cervical: No cervical adenopathy.  Skin:    General: Skin is warm and dry.  Neurological:     Mental Status: She is alert.   Results for orders placed or performed in visit on 07/07/21 (from the past 24 hour(s))  POCT glycosylated hemoglobin (Hb A1C)     Status: Abnormal   Collection Time: 07/07/21  8:37 AM  Result Value Ref Range   Hemoglobin A1C 8.7 (A) 4.0 - 5.6 %   HbA1c POC (<> result, manual entry)     HbA1c, POC (prediabetic range)     HbA1c, POC (controlled diabetic range)     EKG: Normal sinus rhythm with ventricular rate of 66/min.  Nonspecific T wave abnormality.  No acute ischemic changes.  Borderline LVH.  ASSESSMENT & PLAN: Problem List Items Addressed This Visit       Cardiovascular and Mediastinum   Hypertension associated with diabetes ([place])    Uncontrolled hypertension.  Continue amlodipine 10 mg and metoprolol succinate 25 mg daily.  Start losartan 50 mg daily. Uncontrolled diabetes with hemoglobin A1c at 8.7.  Has been off medication for several weeks.  Restart daily Rybelsus 7 mg and glipizide 10 mg in the morning.  Hypoglycemia precautions given.  Diet and nutrition discussed. Cardiovascular risks associated with uncontrolled diabetes and hypertension discussed.       Relevant Medications   amLODipine (NORVASC) 10 MG tablet   metoprolol succinate (TOPROL-XL) 25 MG 24 hr tablet   gabapentin (NEURONTIN) 300 MG capsule   rosuvastatin (CRESTOR) 40 MG tablet   Semaglutide 7 MG TABS   glipiZIDE (GLUCOTROL) 10  MG tablet   losartan (COZAAR) 50 MG tablet   Other Relevant Orders   Urine Microalbumin w/creat. ratio   Comprehensive metabolic panel   POCT glycosylated hemoglobin (Hb A1C) (Completed)   Ambulatory referral to Cardiology     Digestive   Gastroesophageal reflux disease without esophagitis   NAFLD  (nonalcoholic fatty liver disease)    Diet and nutrition discussed.         Endocrine   Dyslipidemia associated with type 2 diabetes mellitus ([place])    Stable.  Continue rosuvastatin 40 mg daily.  Lipid profile done today. The 10-year ASCVD risk score (Arnett DK, et al., 2019) is: 42%   Values used to calculate the score:     Age: 34 years     Sex: Female     Is Non-Hispanic African American: Yes     Diabetic: Yes     Tobacco smoker: No     Systolic Blood Pressure: 846 mmHg     Is BP treated: Yes     HDL Cholesterol: 48.8 mg/dL     Total Cholesterol: 241 mg/dL        Relevant Medications   rosuvastatin (CRESTOR) 40 MG tablet   Semaglutide 7 MG TABS   glipiZIDE (GLUCOTROL) 10 MG tablet   losartan (COZAAR) 50 MG tablet   Other Relevant Orders   Lipid panel     Other   Diverticulosis of colon without hemorrhage    Stable.  No GI obvious bleeding.  CBC done today.       Hyperlipidemia   Relevant Medications   amLODipine (NORVASC) 10 MG tablet   metoprolol succinate (TOPROL-XL) 25 MG 24 hr tablet   rosuvastatin (CRESTOR) 40 MG tablet   losartan (COZAAR) 50 MG tablet   Dyspnea on exertion - Primary    Differential diagnosis discussed. No anginal symptoms but patient is diabetic. EKG shows no acute ischemic changes but shows nonspecific T wave abnormalities and possible LVH. Chest x-ray done today to assess for cardiomegaly.  No clinical findings of congestive heart failure. Uncontrolled diabetes and hypertension.  May have hypertensive heart disease and or coronary artery disease. Needs cardiology evaluation with stress test and echocardiogram.  Referral placed today. Advised to continue daily baby aspirin, beta-blocker, and calcium channel blocker. We will add ARB today. Follow-up in 3 months.  Earlier if needed.       Relevant Orders   DG Chest 2 View (Completed)   Ambulatory referral to Cardiology   Other Visit Diagnoses     Essential hypertension        Relevant Medications   amLODipine (NORVASC) 10 MG tablet   metoprolol succinate (TOPROL-XL) 25 MG 24 hr tablet   rosuvastatin (CRESTOR) 40 MG tablet   losartan (COZAAR) 50 MG tablet   Chronic tension-type headache, not intractable       Relevant Medications   amLODipine (NORVASC) 10 MG tablet   metoprolol succinate (TOPROL-XL) 25 MG 24 hr tablet   gabapentin (NEURONTIN) 300 MG capsule   nortriptyline (PAMELOR) 25 MG capsule   Easy fatigability       Relevant Orders   CBC with Differential/Platelet   Ambulatory referral to Cardiology      Patient Instructions  Hypertension, Adult High blood pressure (hypertension) is when the force of blood pumping through the arteries is too strong. The arteries are the blood vessels that carry blood from the heart throughout the body. Hypertension forces the heart to work harder to pump blood and may cause arteries to  become narrow or stiff. Untreated or uncontrolled hypertension can lead to a heart attack, heart failure, a stroke, kidney disease, and other problems. A blood pressure reading consists of a higher number over a lower number. Ideally, your blood pressure should be below 120/80. The first ("top") number is called the systolic pressure. It is a measure of the pressure in your arteries as your heart beats. The second ("bottom") number is called the diastolic pressure. It is a measure of the pressure in your arteries as the heart relaxes. What are the causes? The exact cause of this condition is not known. There are some conditions that result in high blood pressure. What increases the risk? Certain factors may make you more likely to develop high blood pressure. Some of these risk factors are under your control, including: Smoking. Not getting enough exercise or physical activity. Being overweight. Having too much fat, sugar, calories, or salt (sodium) in your diet. Drinking too much alcohol. Other risk factors include: Having a personal  history of heart disease, diabetes, high cholesterol, or kidney disease. Stress. Having a family history of high blood pressure and high cholesterol. Having obstructive sleep apnea. Age. The risk increases with age. What are the signs or symptoms? High blood pressure may not cause symptoms. Very high blood pressure (hypertensive crisis) may cause: Headache. Fast or irregular heartbeats (palpitations). Shortness of breath. Nosebleed. Nausea and vomiting. Vision changes. Severe chest pain, dizziness, and seizures. How is this diagnosed? This condition is diagnosed by measuring your blood pressure while you are seated, with your arm resting on a flat surface, your legs uncrossed, and your feet flat on the floor. The cuff of the blood pressure monitor will be placed directly against the skin of your upper arm at the level of your heart. Blood pressure should be measured at least twice using the same arm. Certain conditions can cause a difference in blood pressure between your right and left arms. If you have a high blood pressure reading during one visit or you have normal blood pressure with other risk factors, you may be asked to: Return on a different day to have your blood pressure checked again. Monitor your blood pressure at home for 1 week or longer. If you are diagnosed with hypertension, you may have other blood or imaging tests to help your health care provider understand your overall risk for other conditions. How is this treated? This condition is treated by making healthy lifestyle changes, such as eating healthy foods, exercising more, and reducing your alcohol intake. You may be referred for counseling on a healthy diet and physical activity. Your health care provider may prescribe medicine if lifestyle changes are not enough to get your blood pressure under control and if: Your systolic blood pressure is above 130. Your diastolic blood pressure is above 80. Your personal target  blood pressure may vary depending on your medical conditions, your age, and other factors. Follow these instructions at home: Eating and drinking  Eat a diet that is high in fiber and potassium, and low in sodium, added sugar, and fat. An example of this eating plan is called the DASH diet. DASH stands for Dietary Approaches to Stop Hypertension. To eat this way: Eat plenty of fresh fruits and vegetables. Try to fill one half of your plate at each meal with fruits and vegetables. Eat whole grains, such as whole-wheat pasta, brown rice, or whole-grain bread. Fill about one fourth of your plate with whole grains. Eat or drink low-fat dairy  products, such as skim milk or low-fat yogurt. Avoid fatty cuts of meat, processed or cured meats, and poultry with skin. Fill about one fourth of your plate with lean proteins, such as fish, chicken without skin, beans, eggs, or tofu. Avoid pre-made and processed foods. These tend to be higher in sodium, added sugar, and fat. Reduce your daily sodium intake. Many people with hypertension should eat less than 1,500 mg of sodium a day. Do not drink alcohol if: Your health care provider tells you not to drink. You are pregnant, may be pregnant, or are planning to become pregnant. If you drink alcohol: Limit how much you have to: 0-1 drink a day for women. 0-2 drinks a day for men. Know how much alcohol is in your drink. In the [place] one drink equals one 12 oz bottle of beer (355 mL), one 5 oz glass of wine (148 mL), or one 1 oz glass of hard liquor (44 mL). Lifestyle  Work with your health care provider to maintain a healthy body weight or to lose weight. Ask what an ideal weight is for you. Get at least 30 minutes of exercise that causes your heart to beat faster (aerobic exercise) most days of the week. Activities may include walking, swimming, or biking. Include exercise to strengthen your muscles (resistance exercise), such as Pilates or lifting weights, as  part of your weekly exercise routine. Try to do these types of exercises for 30 minutes at least 3 days a week. Do not use any products that contain nicotine or tobacco. These products include cigarettes, chewing tobacco, and vaping devices, such as e-cigarettes. If you need help quitting, ask your health care provider. Monitor your blood pressure at home as told by your health care provider. Keep all follow-up visits. This is important. Medicines Take over-the-counter and prescription medicines only as told by your health care provider. Follow directions carefully. Blood pressure medicines must be taken as prescribed. Do not skip doses of blood pressure medicine. Doing this puts you at risk for problems and can make the medicine less effective. Ask your health care provider about side effects or reactions to medicines that you should watch for. Contact a health care provider if you: Think you are having a reaction to a medicine you are taking. Have headaches that keep coming back (recurring). Feel dizzy. Have swelling in your ankles. Have trouble with your vision. Get help right away if you: Develop a severe headache or confusion. Have unusual weakness or numbness. Feel faint. Have severe pain in your chest or abdomen. Vomit repeatedly. Have trouble breathing. These symptoms may be an emergency. Get help right away. Call 911. Do not wait to see if the symptoms will go away. Do not drive yourself to the hospital. Summary Hypertension is when the force of blood pumping through your arteries is too strong. If this condition is not controlled, it may put you at risk for serious complications. Your personal target blood pressure may vary depending on your medical conditions, your age, and other factors. For most people, a normal blood pressure is less than 120/80. Hypertension is treated with lifestyle changes, medicines, or a combination of both. Lifestyle changes include losing weight,  eating a healthy, low-sodium diet, exercising more, and limiting alcohol. This information is not intended to replace advice given to you by your health care provider. Make sure you discuss any questions you have with your health care provider. Document Revised: 11/24/2020 Document Reviewed: 11/24/2020 Elsevier Patient Education  2023 Elsevier  Inc.     Agustina Caroli, MD [facility]

## 2021-07-07 NOTE — Assessment & Plan Note (Signed)
Uncontrolled hypertension.  Continue amlodipine 10 mg and metoprolol succinate 25 mg daily.  Start losartan 50 mg daily. Uncontrolled diabetes with hemoglobin A1c at 8.7.  Has been off medication for several weeks.  Restart daily Rybelsus 7 mg and glipizide 10 mg in the morning.  Hypoglycemia precautions given.  Diet and nutrition discussed. Cardiovascular risks associated with uncontrolled diabetes and hypertension discussed.
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
## 2021-08-04 ENCOUNTER — Encounter: Payer: Self-pay | Admitting: Cardiovascular Disease

## 2021-08-04 ENCOUNTER — Ambulatory Visit: Payer: PPO | Admitting: Cardiovascular Disease

## 2021-08-04 VITALS — BP 146/78 | HR 75 | Ht 64.0 in | Wt 146.4 lb

## 2021-08-04 DIAGNOSIS — I10 Essential (primary) hypertension: Secondary | ICD-10-CM

## 2021-08-04 DIAGNOSIS — R0609 Other forms of dyspnea: Secondary | ICD-10-CM

## 2021-08-04 DIAGNOSIS — R011 Cardiac murmur, unspecified: Secondary | ICD-10-CM | POA: Diagnosis not present
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
## 2021-08-04 NOTE — Patient Instructions (Signed)
Medication Instructions:  Your physician recommends that you continue on your current medications as directed. Please refer to the Current Medication list given to you today.  *If you need a refill on your cardiac medications before your next appointment, please call your pharmacy   Testing/Procedures: Your physician has requested that you have an echocardiogram. Echocardiography is a painless test that uses sound waves to create images of your heart. It provides your doctor with information about the size and shape of your heart and how well your heart's chambers and valves are working. This procedure takes approximately one hour. There are no restrictions for this procedure.   Your physician has requested that you have a lexiscan myoview. For further information please visit [email or web address]. Please follow instruction sheet, as given.    Follow-Up: At [facility], you and your health needs are our priority.  As part of our continuing mission to provide you with exceptional heart care, we have created designated Provider Care Teams.  These Care Teams include your primary Cardiologist (physician) and Advanced Practice Providers (APPs -  Physician Assistants and Nurse Practitioners) who all work together to provide you with the care you need, when you need it.  We recommend signing up for the patient portal called "MyChart".  Sign up information is provided on this After Visit Summary.  MyChart is used to connect with patients for Virtual Visits (Telemedicine).  Patients are able to view lab/test results, encounter notes, upcoming appointments, etc.  Non-urgent messages can be sent to your provider as well.   To learn more about what you can do with MyChart, go to [email or web address].    Your next appointment:   3 month(s)  The format for your next appointment:   In Person  Provider:   Dr. Acie Fredrickson or APP   Other Instructions Mediterranean Diet A Mediterranean diet refers to food  and lifestyle choices that are based on the traditions of countries located on the [place]. It focuses on eating more fruits, vegetables, whole grains, beans, nuts, seeds, and heart-healthy fats, and eating less dairy, meat, eggs, and processed foods with added sugar, salt, and fat. This way of eating has been shown to help prevent certain conditions and improve outcomes for people who have chronic diseases, like kidney disease and heart disease. What are tips for following this plan? Reading food labels Check the serving size of packaged foods. For foods such as rice and pasta, the serving size refers to the amount of cooked product, not dry. Check the total fat in packaged foods. Avoid foods that have saturated fat or trans fats. Check the ingredient list for added sugars, such as corn syrup. Shopping  Buy a variety of foods that offer a balanced diet, including: Fresh fruits and vegetables (produce). Grains, beans, nuts, and seeds. Some of these may be available in unpackaged forms or large amounts (in bulk). Fresh seafood. Poultry and eggs. Low-fat dairy products. Buy whole ingredients instead of prepackaged foods. Buy fresh fruits and vegetables in-season from local farmers markets. Buy plain frozen fruits and vegetables. If you do not have access to quality fresh seafood, buy precooked frozen shrimp or canned fish, such as tuna, salmon, or sardines. Stock your pantry so you always have certain foods on hand, such as olive oil, canned tuna, canned tomatoes, rice, pasta, and beans. Cooking Cook foods with extra-virgin olive oil instead of using butter or other vegetable oils. Have meat as a side dish, and have vegetables or grains as your main dish.  This means having meat in small portions or adding small amounts of meat to foods like pasta or stew. Use beans or vegetables instead of meat in common dishes like chili or lasagna. Experiment with different cooking methods. Try  roasting, broiling, steaming, and sauting vegetables. Add frozen vegetables to soups, stews, pasta, or rice. Add nuts or seeds for added healthy fats and plant protein at each meal. You can add these to yogurt, salads, or vegetable dishes. Marinate fish or vegetables using olive oil, lemon juice, garlic, and fresh herbs. Meal planning Plan to eat one vegetarian meal one day each week. Try to work up to two vegetarian meals, if possible. Eat seafood two or more times a week. Have healthy snacks readily available, such as: Vegetable sticks with hummus. Greek yogurt. Fruit and nut trail mix. Eat balanced meals throughout the week. This includes: Fruit: 2-3 servings a day. Vegetables: 4-5 servings a day. Low-fat dairy: 2 servings a day. Fish, poultry, or lean meat: 1 serving a day. Beans and legumes: 2 or more servings a week. Nuts and seeds: 1-2 servings a day. Whole grains: 6-8 servings a day. Extra-virgin olive oil: 3-4 servings a day. Limit red meat and sweets to only a few servings a month. Lifestyle  Cook and eat meals together with your family, when possible. Drink enough fluid to keep your urine pale yellow. Be physically active every day. This includes: Aerobic exercise like running or swimming. Leisure activities like gardening, walking, or housework. Get 7-8 hours of sleep each night. If recommended by your health care provider, drink red wine in moderation. This means 1 glass a day for nonpregnant women and 2 glasses a day for men. A glass of wine equals 5 oz (150 mL). What foods should I eat? Fruits Apples. Apricots. Avocado. Berries. Bananas. Cherries. Dates. Figs. Grapes. Lemons. Melon. Oranges. Peaches. Plums. Pomegranate. Vegetables Artichokes. Beets. Broccoli. Cabbage. Carrots. Eggplant. Green beans. Chard. Kale. Spinach. Onions. Leeks. Peas. Squash. Tomatoes. Peppers. Radishes. Grains Whole-grain pasta. Brown rice. Bulgur wheat. Polenta. Couscous. Whole-wheat  bread. [place]. Meats and other proteins Beans. Almonds. Sunflower seeds. Pine nuts. Peanuts. [place]. Salmon. Scallops. Shrimp. [place]. Tilapia. Clams. Oysters. Eggs. Poultry without skin. Dairy Low-fat milk. Cheese. Greek yogurt. Fats and oils Extra-virgin olive oil. Avocado oil. Grapeseed oil. Beverages Water. Red wine. Herbal tea. Sweets and desserts Greek yogurt with honey. Baked apples. Poached pears. Trail mix. Seasonings and condiments Basil. Cilantro. Coriander. Cumin. Mint. Parsley. Sage. Rosemary. Tarragon. Garlic. Oregano. Thyme. Pepper. Balsamic vinegar. Tahini. Hummus. Tomato sauce. Olives. Mushrooms. The items listed above may not be a complete list of foods and beverages you can eat. Contact a dietitian for more information. What foods should I limit? This is a list of foods that should be eaten rarely or only on special occasions. Fruits Fruit canned in syrup. Vegetables Deep-fried potatoes (french fries). Grains Prepackaged pasta or rice dishes. Prepackaged cereal with added sugar. Prepackaged snacks with added sugar. Meats and other proteins Beef. Pork. Lamb. Poultry with skin. Hot dogs. Berniece Salines. Dairy Ice cream. Sour cream. Whole milk. Fats and oils Butter. Canola oil. Vegetable oil. Beef fat (tallow). Lard. Beverages Juice. Sugar-sweetened soft drinks. Beer. Liquor and spirits. Sweets and desserts Cookies. Cakes. Pies. Candy. Seasonings and condiments Mayonnaise. Pre-made sauces and marinades. The items listed above may not be a complete list of foods and beverages you should limit. Contact a dietitian for more information. Summary The Mediterranean diet includes both food and lifestyle choices. Eat a variety of fresh fruits  and vegetables, beans, nuts, seeds, and whole grains. Limit the amount of red meat and sweets that you eat. If recommended by your health care provider, drink red wine in moderation. This means 1 glass a day for nonpregnant women and 2  glasses a day for men. A glass of wine equals 5 oz (150 mL). This information is not intended to replace advice given to you by your health care provider. Make sure you discuss any questions you have with your health care provider. Document Revised: 02/22/2019 Document Reviewed: 12/20/2018 Elsevier Patient Education  [place].

## 2021-08-11 ENCOUNTER — Telehealth (HOSPITAL_COMMUNITY): Payer: Self-pay | Admitting: *Deleted

## 2021-08-11 ENCOUNTER — Encounter (HOSPITAL_COMMUNITY): Payer: Self-pay | Admitting: *Deleted

## 2021-08-11 NOTE — Telephone Encounter (Signed)
Left message on voicemail in reference to upcoming appointment scheduled for 08/16/21 Phone number given for a call back so details instructions can be given. Letter with instructions sent to my chart.  Erin Benitez

## 2021-08-12 ENCOUNTER — Telehealth (HOSPITAL_COMMUNITY): Payer: Self-pay | Admitting: Cardiovascular Disease

## 2021-08-12 NOTE — Telephone Encounter (Signed)
Patient cancelled Myoview for 08/16/21 due to she has to work. She just started a new job and does not want to miss. She will call us back when she is able to reschedule. Order will be removed from the [facility] and when patient calls back to reschedule we will reinstate the order. Thank you

## 2021-08-16 ENCOUNTER — Ambulatory Visit (HOSPITAL_COMMUNITY): Payer: PPO

## 2021-08-18 ENCOUNTER — Ambulatory Visit (HOSPITAL_COMMUNITY): Payer: PPO | Attending: Internal Medicine

## 2021-08-18 DIAGNOSIS — I10 Essential (primary) hypertension: Secondary | ICD-10-CM | POA: Insufficient documentation

## 2021-08-18 DIAGNOSIS — R0609 Other forms of dyspnea: Secondary | ICD-10-CM | POA: Diagnosis not present

## 2021-08-18 DIAGNOSIS — R011 Cardiac murmur, unspecified: Secondary | ICD-10-CM | POA: Diagnosis not present

## 2021-08-18 LAB — ECHOCARDIOGRAM COMPLETE
AR max vel: 0.94 cm2
AV Area VTI: 0.94 cm2
AV Area mean vel: 0.88 cm2
AV Mean grad: 9.2 mmHg
AV Peak grad: 16 mmHg
Ao pk vel: 2 m/s
Area-P 1/2: 3.08 cm2
S' Lateral: 2.5 cm

## 2021-08-20 NOTE — Addendum Note (Signed)
Addended by: Rae Mar on: 08/20/2021 11:42 AM   Modules accepted: Orders

## 2021-10-06 ENCOUNTER — Ambulatory Visit (INDEPENDENT_AMBULATORY_CARE_PROVIDER_SITE_OTHER): Payer: PPO | Admitting: Emergency Medicine

## 2021-10-06 ENCOUNTER — Encounter: Payer: Self-pay | Admitting: Emergency Medicine

## 2021-10-06 VITALS — BP 138/78 | HR 78 | Temp 98.4°F | Ht 64.0 in | Wt 149.5 lb

## 2021-10-06 DIAGNOSIS — K219 Gastro-esophageal reflux disease without esophagitis: Secondary | ICD-10-CM

## 2021-10-06 DIAGNOSIS — E1165 Type 2 diabetes mellitus with hyperglycemia: Secondary | ICD-10-CM

## 2021-10-06 DIAGNOSIS — E1169 Type 2 diabetes mellitus with other specified complication: Secondary | ICD-10-CM | POA: Diagnosis not present

## 2021-10-06 DIAGNOSIS — G4700 Insomnia, unspecified: Secondary | ICD-10-CM

## 2021-10-06 DIAGNOSIS — E785 Hyperlipidemia, unspecified: Secondary | ICD-10-CM | POA: Diagnosis not present

## 2021-10-06 DIAGNOSIS — I152 Hypertension secondary to endocrine disorders: Secondary | ICD-10-CM

## 2021-10-06 DIAGNOSIS — E1159 Type 2 diabetes mellitus with other circulatory complications: Secondary | ICD-10-CM | POA: Diagnosis not present

## 2021-10-06 LAB — POCT GLYCOSYLATED HEMOGLOBIN (HGB A1C): Hemoglobin A1C: 6.6 % — AB (ref 4.0–5.6)

## 2021-10-06 MED ORDER — ZOLPIDEM TARTRATE 5 MG PO TABS: 5.0000 mg | ORAL_TABLET | Freq: Every evening | ORAL | 1 refills | Status: DC | PRN

## 2021-10-06 NOTE — Assessment & Plan Note (Signed)
Well-controlled hypertension. Continue amlodipine 10 mg, metoprolol succinate 25 mg daily, and losartan 50 mg daily. Well-controlled diabetes with hemoglobin A1c of 6.6, down from 8.7. Continue daily Rybelsus 7 mg and glipizide 10 mg twice a day. Diet and nutrition discussed. Cardiovascular risks associated with hypertension and diabetes discussed. Follow-up in 3 months. BP Readings from Last 3 Encounters:  10/06/21 138/78  08/04/21 (!) 146/78  07/07/21 (!) 158/84   Lab Results  Component Value Date   HGBA1C 6.6 (A) 10/06/2021

## 2021-10-06 NOTE — Progress Notes (Signed)
Erin Benitez 71 y.o.   Chief Complaint  Patient presents with   Follow-up    64mth f/u appt, pt having trouble with sleep     HISTORY OF PRESENT ILLNESS: This is a 71y.o. female A1A here for 372-monthollow-up of diabetes. Presently taking glipizide 10 mg twice a day and daily Rybelsus 7 mg. Hypertension well-controlled on amlodipine and losartan. During last office visit she was complaining of dyspnea on exertion. Had cardiology evaluation with normal echocardiogram Needs to schedule stress test as recommended Overall doing better. Still struggling with trouble sleeping.  Only sleeping 2 to 3 hours per night. No other complaints or medical concerns today.  HPI   Prior to Admission medications   Medication Sig Start Date End Date Taking? Authorizing Provider  albuterol (VENTOLIN HFA) 108 (90 Base) MCG/ACT inhaler Inhale 2 puffs into the lungs every 6 (six) hours as needed for wheezing (or cough). 06/23/20  Yes Amyot, AnNicholes StairsNP  amLODipine (NORVASC) 10 MG tablet Take 1 tablet (10 mg total) by mouth daily. 07/07/21  Yes Kallee Nam, MiInes BloomerMD  gabapentin (NEURONTIN) 300 MG capsule Take 1-2 capsules (300-600 mg total) by mouth at bedtime. 07/07/21  Yes Leinaala Catanese, MiInes BloomerMD  glipiZIDE (GLUCOTROL) 10 MG tablet Take 1 tablet (10 mg total) by mouth daily before breakfast. 07/07/21  Yes Rilee Wendling, MiInes BloomerMD  losartan (COZAAR) 50 MG tablet Take 1 tablet (50 mg total) by mouth daily. 07/07/21  Yes Florenda Watt, MiInes BloomerMD  metoprolol succinate (TOPROL-XL) 25 MG 24 hr tablet Take 1 tablet (25 mg total) by mouth daily. 07/07/21  Yes Issac Moure, MiInes BloomerMD  nortriptyline (PAMELOR) 25 MG capsule Take 1 capsule (25 mg total) by mouth at bedtime. 07/07/21  Yes SaHorald PollenMD  pantoprazole (PROTONIX) 40 MG tablet Take 1 tablet by mouth once daily 07/07/21  Yes Timi Reeser, MiInes BloomerMD  rosuvastatin (CRESTOR) 40 MG tablet Take 1 tablet (40 mg total) by mouth daily. 07/07/21  Yes  Kayston Jodoin, MiInes BloomerMD  Semaglutide 7 MG TABS Take 7 mg by mouth daily. 07/07/21  Yes SaPlainvilleMiInes BloomerMD    Allergies  Allergen Reactions   Lisinopril Swelling   Peanut-Containing Drug Products Anaphylaxis, Hives, Swelling and Other (See Comments)    Pistachio's - swelling of the throat      Tramadol Nausea And Vomiting    Headache & dizziness with n&v    Patient Active Problem List   Diagnosis Date Noted   NAFLD (nonalcoholic fatty liver disease) 07/07/2021   Dyspnea on exertion 07/07/2021   Protrusion of cervical intervertebral disc 10/21/2020   Spinal stenosis of cervical region 10/21/2020   Chronic left shoulder pain 07/07/2020   Dyslipidemia associated with type 2 diabetes mellitus ([place]/07/2017   Diverticulosis of colon without hemorrhage 03/09/2015   Hyperlipidemia 03/09/2015   Gastroesophageal reflux disease without esophagitis 03/09/2015   Hypertension associated with diabetes ([place]/03/2013    Past Medical History:  Diagnosis Date   Allergy    Asthma    Bronchitis    Cataract    Diverticulosis    DM2 (diabetes mellitus, type 2) (HCC)    GERD (gastroesophageal reflux disease)    Heart murmur    Hyperplastic colon polyp    Hypertension     Past Surgical History:  Procedure Laterality Date   CESAREAN SECTION     COLONOSCOPY     ESOPHAGOGASTRODUODENOSCOPY     TONSILLECTOMY      Social History  Socioeconomic History   Marital status: Single    Spouse name: Not on file   Number of children: 1   Years of education: Not on file   Highest education level: Not on file  Occupational History   Occupation: Educator  Tobacco Use   Smoking status: Never   Smokeless tobacco: Never  Vaping Use   Vaping Use: Never used  Substance and Sexual Activity   Alcohol use: No   Drug use: No   Sexual activity: Yes  Other Topics Concern   Not on file  Social History Narrative   She reports she is single. She has 1 daughter. She is an Tourist information centre manager.  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     2 caffeinated beverages daily.   No alcohol tobacco or drug use.   Social Determinants of Health   Financial Resource Strain: Low Risk  (11/02/2020)   Overall Financial Resource Strain (CARDIA)    Difficulty of Paying Living Expenses: Not hard at all  Food Insecurity: No Food Insecurity (09/01/2020)   Hunger Vital Sign    Worried About Running Out of Food in the Last Year: Never true    Ran Out of Food in the Last Year: Never true  Transportation Needs: No Transportation Needs (11/02/2020)   PRAPARE - Hydrologist (Medical): No    Lack of Transportation (Non-Medical): No  Physical Activity: Sufficiently Active (11/02/2020)   Exercise Vital Sign    Days of Exercise per Week: 5 days    Minutes of Exercise per Session: 30 min  Stress: No Stress Concern Present (11/02/2020)   [place]    Feeling of Stress : Not at all  Social Connections: Moderately Integrated (11/02/2020)   Social Connection and Isolation Panel [NHANES]    Frequency of Communication with Friends and Family: More than three times a week    Frequency of Social Gatherings with Friends and Family: More than three times a week    Attends Religious Services: More than 4 times per year    Active Member of [employer] or Organizations: Yes    Attends Archivist Meetings: More than 4 times per year    Marital Status: Never married  Intimate Partner Violence: Not At Risk (11/02/2020)   Humiliation, Afraid, Rape, and Kick questionnaire    Fear of Current or Ex-Partner: No    Emotionally Abused: No    Physically Abused: No    Sexually Abused: No    Family History  Problem Relation Age of Onset   Cancer Mother        ovarian cancer   Heart disease  Mother    Liver disease Mother    Hyperlipidemia Mother    Stroke Mother    Cancer Father        lung cancer   Cancer Sister    Hyperlipidemia Sister    Cancer Sister    Hyperlipidemia Sister    Diabetes Maternal Aunt    Colon cancer Maternal Aunt        x2 aunts   Colon cancer Maternal Uncle        2 uncles   Colon cancer Paternal Grandfather    Esophageal cancer Neg Hx    Rectal cancer Neg Hx    Stomach cancer Neg Hx  Review of Systems  Constitutional: Negative.  Negative for chills and fever.  HENT: Negative.  Negative for congestion and sore throat.   Respiratory: Negative.  Negative for cough and shortness of breath.   Cardiovascular: Negative.  Negative for chest pain and palpitations.  Gastrointestinal: Negative.  Negative for abdominal pain, diarrhea, nausea and vomiting.  Genitourinary: Negative.   Musculoskeletal: Negative.   Skin: Negative.  Negative for rash.  Neurological:  Negative for dizziness and headaches.  Psychiatric/Behavioral:  The patient has insomnia.   All other systems reviewed and are negative.  Today's Vitals   10/06/21 0931  BP: 138/78  Pulse: 78  Temp: 98.4 F (36.9 C)  TempSrc: Oral  SpO2: 97%  Weight: 149 lb 8 oz (67.8 kg)  Height: '5\' 4"'$  (1.626 m)   Body mass index is 25.66 kg/m. Wt Readings from Last 3 Encounters:  10/06/21 149 lb 8 oz (67.8 kg)  08/04/21 146 lb 6.4 oz (66.4 kg)  07/07/21 143 lb 2 oz (64.9 kg)     Physical Exam Vitals reviewed.  Constitutional:      Appearance: Normal appearance.  HENT:     Head: Normocephalic.  Eyes:     Extraocular Movements: Extraocular movements intact.     Conjunctiva/sclera: Conjunctivae normal.     Pupils: Pupils are equal, round, and reactive to light.  Cardiovascular:     Rate and Rhythm: Normal rate and regular rhythm.     Pulses: Normal pulses.     Heart sounds: Normal heart sounds.  Pulmonary:     Effort: Pulmonary effort is normal.     Breath sounds: Normal breath  sounds.  Abdominal:     Palpations: Abdomen is soft.     Tenderness: There is no abdominal tenderness.  Musculoskeletal:        General: Normal range of motion.     Cervical back: No tenderness.     Right lower leg: No edema.     Left lower leg: No edema.  Lymphadenopathy:     Cervical: No cervical adenopathy.  Skin:    General: Skin is warm and dry.     Capillary Refill: Capillary refill takes less than 2 seconds.  Neurological:     General: No focal deficit present.     Mental Status: She is alert and oriented to person, place, and time.  Psychiatric:        Mood and Affect: Mood normal.        Behavior: Behavior normal.   Today's Vitals   10/06/21 0931  BP: 138/78  Pulse: 78  Temp: 98.4 F (36.9 C)  TempSrc: Oral  SpO2: 97%  Weight: 149 lb 8 oz (67.8 kg)  Height: '5\' 4"'$  (1.626 m)   Body mass index is 25.66 kg/m. Wt Readings from Last 3 Encounters:  10/06/21 149 lb 8 oz (67.8 kg)  08/04/21 146 lb 6.4 oz (66.4 kg)  07/07/21 143 lb 2 oz (64.9 kg)   Results for orders placed or performed in visit on 10/06/21 (from the past 24 hour(s))  POCT HgB A1C     Status: Abnormal   Collection Time: 10/06/21  9:42 AM  Result Value Ref Range   Hemoglobin A1C 6.6 (A) 4.0 - 5.6 %   HbA1c POC (<> result, manual entry)     HbA1c, POC (prediabetic range)     HbA1c, POC (controlled diabetic range)       ASSESSMENT & PLAN: A total of 46 minutes was spent with the patient and counseling/coordination  of care regarding preparing for this visit, review of most recent office visit notes, review of most recent blood work results including interpretation of today's hemoglobin A1c, review of most recent cardiologist office visit notes, review of most recent echocardiogram report, cardiovascular risks associated with diabetes and hypertension, review of all medications, education on nutrition, treatment of insomnia, prognosis, documentation, and need for follow-up.  Problem List Items Addressed  This Visit       Cardiovascular and Mediastinum   Hypertension associated with diabetes ([place]) - Primary    Well-controlled hypertension. Continue amlodipine 10 mg, metoprolol succinate 25 mg daily, and losartan 50 mg daily. Well-controlled diabetes with hemoglobin A1c of 6.6, down from 8.7. Continue daily Rybelsus 7 mg and glipizide 10 mg twice a day. Diet and nutrition discussed. Cardiovascular risks associated with hypertension and diabetes discussed. Follow-up in 3 months. BP Readings from Last 3 Encounters:  10/06/21 138/78  08/04/21 (!) 146/78  07/07/21 (!) 158/84   Lab Results  Component Value Date   HGBA1C 6.6 (A) 10/06/2021           Digestive   Gastroesophageal reflux disease without esophagitis    Stable and asymptomatic. Continue Protonix 40 mg daily.        Endocrine   Dyslipidemia associated with type 2 diabetes mellitus ([place])    Stable.  Diet and nutrition discussed. Continue rosuvastatin 40 mg daily. The 10-year ASCVD risk score (Arnett DK, et al., 2019) is: 31.6%   Values used to calculate the score:     Age: 61 years     Sex: Female     Is Non-Hispanic African American: Yes     Diabetic: Yes     Tobacco smoker: No     Systolic Blood Pressure: 027 mmHg     Is BP treated: Yes     HDL Cholesterol: 50.4 mg/dL     Total Cholesterol: 217 mg/dL         Other   Insomnia    Active and affecting quality of life. Recommend to start Ambien 5 mg at bedtime as needed. Sleep hygiene measures discussed with patient.      Relevant Medications   zolpidem (AMBIEN) 5 MG tablet   Other Visit Diagnoses     Type 2 diabetes mellitus with hyperglycemia, without long-term current use of insulin (HCC)       Relevant Orders   POCT HgB A1C (Completed)      Patient Instructions  Diabetes Mellitus and Nutrition, Adult When you have diabetes, or diabetes mellitus, it is very important to have healthy eating habits because your blood sugar (glucose) levels are  greatly affected by what you eat and drink. Eating healthy foods in the right amounts, at about the same times every day, can help you: Manage your blood glucose. Lower your risk of heart disease. Improve your blood pressure. Reach or maintain a healthy weight. What can affect my meal plan? Every person with diabetes is different, and each person has different needs for a meal plan. Your health care provider may recommend that you work with a dietitian to make a meal plan that is best for you. Your meal plan may vary depending on factors such as: The calories you need. The medicines you take. Your weight. Your blood glucose, blood pressure, and cholesterol levels. Your activity level. Other health conditions you have, such as heart or kidney disease. How do carbohydrates affect me? Carbohydrates, also called carbs, affect your blood glucose level more than any other  type of food. Eating carbs raises the amount of glucose in your blood. It is important to know how many carbs you can safely have in each meal. This is different for every person. Your dietitian can help you calculate how many carbs you should have at each meal and for each snack. How does alcohol affect me? Alcohol can cause a decrease in blood glucose (hypoglycemia), especially if you use insulin or take certain diabetes medicines by mouth. Hypoglycemia can be a life-threatening condition. Symptoms of hypoglycemia, such as sleepiness, dizziness, and confusion, are similar to symptoms of having too much alcohol. Do not drink alcohol if: Your health care provider tells you not to drink. You are pregnant, may be pregnant, or are planning to become pregnant. If you drink alcohol: Limit how much you have to: 0-1 drink a day for women. 0-2 drinks a day for men. Know how much alcohol is in your drink. In the [place] one drink equals one 12 oz bottle of beer (355 mL), one 5 oz glass of wine (148 mL), or one 1 oz glass of hard liquor (44  mL). Keep yourself hydrated with water, diet soda, or unsweetened iced tea. Keep in mind that regular soda, juice, and other mixers may contain a lot of sugar and must be counted as carbs. What are tips for following this plan?  Reading food labels Start by checking the serving size on the Nutrition Facts label of packaged foods and drinks. The number of calories and the amount of carbs, fats, and other nutrients listed on the label are based on one serving of the item. Many items contain more than one serving per package. Check the total grams (g) of carbs in one serving. Check the number of grams of saturated fats and trans fats in one serving. Choose foods that have a low amount or none of these fats. Check the number of milligrams (mg) of salt (sodium) in one serving. Most people should limit total sodium intake to less than 2,300 mg per day. Always check the nutrition information of foods labeled as "low-fat" or "nonfat." These foods may be higher in added sugar or refined carbs and should be avoided. Talk to your dietitian to identify your daily goals for nutrients listed on the label. Shopping Avoid buying canned, pre-made, or processed foods. These foods tend to be high in fat, sodium, and added sugar. Shop around the outside edge of the grocery store. This is where you will most often find fresh fruits and vegetables, bulk grains, fresh meats, and fresh dairy products. Cooking Use low-heat cooking methods, such as baking, instead of high-heat cooking methods, such as deep frying. Cook using healthy oils, such as olive, canola, or sunflower oil. Avoid cooking with butter, cream, or high-fat meats. Meal planning Eat meals and snacks regularly, preferably at the same times every day. Avoid going long periods of time without eating. Eat foods that are high in fiber, such as fresh fruits, vegetables, beans, and whole grains. Eat 4-6 oz (112-168 g) of lean protein each day, such as lean meat,  chicken, fish, eggs, or tofu. One ounce (oz) (28 g) of lean protein is equal to: 1 oz (28 g) of meat, chicken, or fish. 1 egg.  cup (62 g) of tofu. Eat some foods each day that contain healthy fats, such as avocado, nuts, seeds, and fish. What foods should I eat? Fruits Berries. Apples. Oranges. Peaches. Apricots. Plums. Grapes. Mangoes. Papayas. Pomegranates. Kiwi. Cherries. Vegetables Leafy greens, including lettuce,  spinach, kale, chard, collard greens, mustard greens, and cabbage. Beets. Cauliflower. Broccoli. Carrots. Green beans. Tomatoes. Peppers. Onions. Cucumbers. Brussels sprouts. Grains Whole grains, such as whole-wheat or whole-grain bread, crackers, tortillas, cereal, and pasta. Unsweetened oatmeal. Quinoa. Brown or wild rice. Meats and other proteins Seafood. Poultry without skin. Lean cuts of poultry and beef. Tofu. Nuts. Seeds. Dairy Low-fat or fat-free dairy products such as milk, yogurt, and cheese. The items listed above may not be a complete list of foods and beverages you can eat and drink. Contact a dietitian for more information. What foods should I avoid? Fruits Fruits canned with syrup. Vegetables Canned vegetables. Frozen vegetables with butter or cream sauce. Grains Refined white flour and flour products such as bread, pasta, snack foods, and cereals. Avoid all processed foods. Meats and other proteins Fatty cuts of meat. Poultry with skin. Breaded or fried meats. Processed meat. Avoid saturated fats. Dairy Full-fat yogurt, cheese, or milk. Beverages Sweetened drinks, such as soda or iced tea. The items listed above may not be a complete list of foods and beverages you should avoid. Contact a dietitian for more information. Questions to ask a health care provider Do I need to meet with a certified diabetes care and education specialist? Do I need to meet with a dietitian? What number can I call if I have questions? When are the best times to check my  blood glucose? Where to find more information: American Diabetes Association: diabetes.org Academy of Nutrition and Dietetics: eatright.[employer] of Diabetes and Digestive and Kidney Diseases: [employer]is Association of Diabetes Care & Education Specialists: diabeteseducator.org Summary It is important to have healthy eating habits because your blood sugar (glucose) levels are greatly affected by what you eat and drink. It is important to use alcohol carefully. A healthy meal plan will help you manage your blood glucose and lower your risk of heart disease. Your health care provider may recommend that you work with a dietitian to make a meal plan that is best for you. This information is not intended to replace advice given to you by your health care provider. Make sure you discuss any questions you have with your health care provider. Document Revised: 08/21/2019 Document Reviewed: 08/21/2019 Elsevier Patient Education  [place], MD [facility]

## 2021-10-06 NOTE — Assessment & Plan Note (Signed)
Active and affecting quality of life. Recommend to start Ambien 5 mg at bedtime as needed. Sleep hygiene measures discussed with patient.

## 2021-10-06 NOTE — Patient Instructions (Signed)

## 2021-10-06 NOTE — Assessment & Plan Note (Signed)
Stable.  Diet and nutrition discussed. Continue rosuvastatin 40 mg daily. The 10-year ASCVD risk score (Arnett DK, et al., 2019) is: 31.6%   Values used to calculate the score:     Age: 71 years     Sex: Female     Is Non-Hispanic African American: Yes     Diabetic: Yes     Tobacco smoker: No     Systolic Blood Pressure: 224 mmHg     Is BP treated: Yes     HDL Cholesterol: 50.4 mg/dL     Total Cholesterol: 217 mg/dL

## 2021-10-06 NOTE — Assessment & Plan Note (Signed)
Stable and asymptomatic. Continue Protonix 40 mg daily. 

## 2021-10-21 ENCOUNTER — Encounter: Payer: Self-pay | Admitting: Emergency Medicine

## 2021-10-21 NOTE — Telephone Encounter (Signed)
Okay to provide work note as requested.  Thanks.

## 2021-10-28 ENCOUNTER — Telehealth: Payer: Self-pay | Admitting: Emergency Medicine

## 2021-10-28 NOTE — Telephone Encounter (Signed)
N/A unable to leave a message for patient to call back to schedule Medicare Annual Wellness Visit   Last AWV  11/02/21  Please schedule at anytime with [facility] Smock if patient calls the office back.     Any questions, please call me at [phone]

## 2021-11-08 ENCOUNTER — Telehealth: Payer: Self-pay | Admitting: Emergency Medicine

## 2021-11-08 NOTE — Telephone Encounter (Signed)
LVM for pt to rtn my call to schedule AWV with [facility] call back # [phone] 

## 2021-11-09 ENCOUNTER — Ambulatory Visit: Payer: PPO | Admitting: Cardiovascular Disease

## 2021-12-22 ENCOUNTER — Telehealth: Payer: Self-pay | Admitting: Emergency Medicine

## 2021-12-22 NOTE — Telephone Encounter (Signed)
LVM for pt to rtn my call to schedule AWV with [facility] call back # [phone] 

## 2022-01-05 ENCOUNTER — Ambulatory Visit (INDEPENDENT_AMBULATORY_CARE_PROVIDER_SITE_OTHER): Payer: PPO | Admitting: Emergency Medicine

## 2022-01-05 ENCOUNTER — Encounter: Payer: Self-pay | Admitting: Emergency Medicine

## 2022-01-05 VITALS — BP 136/88 | HR 82 | Temp 98.2°F | Ht 64.0 in | Wt 156.1 lb

## 2022-01-05 DIAGNOSIS — R1013 Epigastric pain: Secondary | ICD-10-CM

## 2022-01-05 DIAGNOSIS — E1159 Type 2 diabetes mellitus with other circulatory complications: Secondary | ICD-10-CM

## 2022-01-05 DIAGNOSIS — I10 Essential (primary) hypertension: Secondary | ICD-10-CM

## 2022-01-05 DIAGNOSIS — I152 Hypertension secondary to endocrine disorders: Secondary | ICD-10-CM | POA: Diagnosis not present

## 2022-01-05 DIAGNOSIS — K219 Gastro-esophageal reflux disease without esophagitis: Secondary | ICD-10-CM

## 2022-01-05 DIAGNOSIS — E785 Hyperlipidemia, unspecified: Secondary | ICD-10-CM | POA: Diagnosis not present

## 2022-01-05 DIAGNOSIS — E1169 Type 2 diabetes mellitus with other specified complication: Secondary | ICD-10-CM

## 2022-01-05 LAB — CBC WITH DIFFERENTIAL/PLATELET
Basophils Absolute: 0 10*3/uL (ref 0.0–0.1)
Basophils Relative: 0.6 % (ref 0.0–3.0)
Eosinophils Absolute: 0.2 10*3/uL (ref 0.0–0.7)
Eosinophils Relative: 3.6 % (ref 0.0–5.0)
HCT: 38.1 % (ref 36.0–46.0)
Hemoglobin: 12.7 g/dL (ref 12.0–15.0)
Lymphocytes Relative: 31.2 % (ref 12.0–46.0)
Lymphs Abs: 1.4 10*3/uL (ref 0.7–4.0)
MCHC: 33.3 g/dL (ref 30.0–36.0)
MCV: 87.4 fl (ref 78.0–100.0)
Monocytes Absolute: 0.4 10*3/uL (ref 0.1–1.0)
Monocytes Relative: 9 % (ref 3.0–12.0)
Neutro Abs: 2.5 10*3/uL (ref 1.4–7.7)
Neutrophils Relative %: 55.6 % (ref 43.0–77.0)
Platelets: 258 10*3/uL (ref 150.0–400.0)
RBC: 4.35 Mil/uL (ref 3.87–5.11)
RDW: 14.3 % (ref 11.5–15.5)
WBC: 4.4 10*3/uL (ref 4.0–10.5)

## 2022-01-05 LAB — COMPREHENSIVE METABOLIC PANEL
ALT: 12 U/L (ref 0–35)
AST: 15 U/L (ref 0–37)
Albumin: 4.6 g/dL (ref 3.5–5.2)
Alkaline Phosphatase: 89 U/L (ref 39–117)
BUN: 16 mg/dL (ref 6–23)
CO2: 30 mEq/L (ref 19–32)
Calcium: 9.4 mg/dL (ref 8.4–10.5)
Chloride: 104 mEq/L (ref 96–112)
Creatinine, Ser: 0.81 mg/dL (ref 0.40–1.20)
GFR: 73.16 mL/min (ref >60.00)
Glucose, Bld: 121 mg/dL — ABNORMAL HIGH (ref 70–99)
Potassium: 4.2 mEq/L (ref 3.5–5.1)
Sodium: 140 mEq/L (ref 135–145)
Total Bilirubin: 0.4 mg/dL (ref 0.2–1.2)
Total Protein: 7.7 g/dL (ref 6.0–8.3)

## 2022-01-05 LAB — LIPID PANEL
Cholesterol: 154 mg/dL (ref 0–200)
HDL: 50.1 mg/dL (ref >39.00)
LDL Cholesterol: 73 mg/dL (ref 0–99)
NonHDL: 104.01
Total CHOL/HDL Ratio: 3
Triglycerides: 156 mg/dL — ABNORMAL HIGH (ref 0.0–149.0)
VLDL: 31.2 mg/dL (ref 0.0–40.0)

## 2022-01-05 LAB — POCT GLYCOSYLATED HEMOGLOBIN (HGB A1C): Hemoglobin A1C: 6.1 % — AB (ref 4.0–5.6)

## 2022-01-05 MED ORDER — PANTOPRAZOLE SODIUM 40 MG PO TBEC: 40.0000 mg | DELAYED_RELEASE_TABLET | Freq: Every day | ORAL | 3 refills | Status: DC

## 2022-01-05 MED ORDER — AMLODIPINE BESYLATE 10 MG PO TABS: 10.0000 mg | ORAL_TABLET | Freq: Every day | ORAL | 3 refills | Status: DC

## 2022-01-05 NOTE — Assessment & Plan Note (Signed)
Upper endoscopy from May 2021 showed multiple gastric erosions. Clinically no signs of bleeding.  No melena. Start pantoprazole 40 mg daily for 1 month. Diet nutrition discussed.

## 2022-01-05 NOTE — Patient Instructions (Signed)
Hypertension, Adult High blood pressure (hypertension) is when the force of blood pumping through the arteries is too strong. The arteries are the blood vessels that carry blood from the heart throughout the body. Hypertension forces the heart to work harder to pump blood and may cause arteries to become narrow or stiff. Untreated or uncontrolled hypertension can lead to a heart attack, heart failure, a stroke, kidney disease, and other problems. A blood pressure reading consists of a higher number over a lower number. Ideally, your blood pressure should be below 120/80. The first ("top") number is called the systolic pressure. It is a measure of the pressure in your arteries as your heart beats. The second ("bottom") number is called the diastolic pressure. It is a measure of the pressure in your arteries as the heart relaxes. What are the causes? The exact cause of this condition is not known. There are some conditions that result in high blood pressure. What increases the risk? Certain factors may make you more likely to develop high blood pressure. Some of these risk factors are under your control, including: Smoking. Not getting enough exercise or physical activity. Being overweight. Having too much fat, sugar, calories, or salt (sodium) in your diet. Drinking too much alcohol. Other risk factors include: Having a personal history of heart disease, diabetes, high cholesterol, or kidney disease. Stress. Having a family history of high blood pressure and high cholesterol. Having obstructive sleep apnea. Age. The risk increases with age. What are the signs or symptoms? High blood pressure may not cause symptoms. Very high blood pressure (hypertensive crisis) may cause: Headache. Fast or irregular heartbeats (palpitations). Shortness of breath. Nosebleed. Nausea and vomiting. Vision changes. Severe chest pain, dizziness, and seizures. How is this diagnosed? This condition is diagnosed by  measuring your blood pressure while you are seated, with your arm resting on a flat surface, your legs uncrossed, and your feet flat on the floor. The cuff of the blood pressure monitor will be placed directly against the skin of your upper arm at the level of your heart. Blood pressure should be measured at least twice using the same arm. Certain conditions can cause a difference in blood pressure between your right and left arms. If you have a high blood pressure reading during one visit or you have normal blood pressure with other risk factors, you may be asked to: Return on a different day to have your blood pressure checked again. Monitor your blood pressure at home for 1 week or longer. If you are diagnosed with hypertension, you may have other blood or imaging tests to help your health care provider understand your overall risk for other conditions. How is this treated? This condition is treated by making healthy lifestyle changes, such as eating healthy foods, exercising more, and reducing your alcohol intake. You may be referred for counseling on a healthy diet and physical activity. Your health care provider may prescribe medicine if lifestyle changes are not enough to get your blood pressure under control and if: Your systolic blood pressure is above 130. Your diastolic blood pressure is above 80. Your personal target blood pressure may vary depending on your medical conditions, your age, and other factors. Follow these instructions at home: Eating and drinking  Eat a diet that is high in fiber and potassium, and low in sodium, added sugar, and fat. An example of this eating plan is called the DASH diet. DASH stands for Dietary Approaches to Stop Hypertension. To eat this way: Eat   plenty of fresh fruits and vegetables. Try to fill one half of your plate at each meal with fruits and vegetables. Eat whole grains, such as whole-wheat pasta, brown rice, or whole-grain bread. Fill about one  fourth of your plate with whole grains. Eat or drink low-fat dairy products, such as skim milk or low-fat yogurt. Avoid fatty cuts of meat, processed or cured meats, and poultry with skin. Fill about one fourth of your plate with lean proteins, such as fish, chicken without skin, beans, eggs, or tofu. Avoid pre-made and processed foods. These tend to be higher in sodium, added sugar, and fat. Reduce your daily sodium intake. Many people with hypertension should eat less than 1,500 mg of sodium a day. Do not drink alcohol if: Your health care provider tells you not to drink. You are pregnant, may be pregnant, or are planning to become pregnant. If you drink alcohol: Limit how much you have to: 0-1 drink a day for women. 0-2 drinks a day for men. Know how much alcohol is in your drink. In the [place] one drink equals one 12 oz bottle of beer (355 mL), one 5 oz glass of wine (148 mL), or one 1 oz glass of hard liquor (44 mL). Lifestyle  Work with your health care provider to maintain a healthy body weight or to lose weight. Ask what an ideal weight is for you. Get at least 30 minutes of exercise that causes your heart to beat faster (aerobic exercise) most days of the week. Activities may include walking, swimming, or biking. Include exercise to strengthen your muscles (resistance exercise), such as Pilates or lifting weights, as part of your weekly exercise routine. Try to do these types of exercises for 30 minutes at least 3 days a week. Do not use any products that contain nicotine or tobacco. These products include cigarettes, chewing tobacco, and vaping devices, such as e-cigarettes. If you need help quitting, ask your health care provider. Monitor your blood pressure at home as told by your health care provider. Keep all follow-up visits. This is important. Medicines Take over-the-counter and prescription medicines only as told by your health care provider. Follow directions carefully. Blood  pressure medicines must be taken as prescribed. Do not skip doses of blood pressure medicine. Doing this puts you at risk for problems and can make the medicine less effective. Ask your health care provider about side effects or reactions to medicines that you should watch for. Contact a health care provider if you: Think you are having a reaction to a medicine you are taking. Have headaches that keep coming back (recurring). Feel dizzy. Have swelling in your ankles. Have trouble with your vision. Get help right away if you: Develop a severe headache or confusion. Have unusual weakness or numbness. Feel faint. Have severe pain in your chest or abdomen. Vomit repeatedly. Have trouble breathing. These symptoms may be an emergency. Get help right away. Call 911. Do not wait to see if the symptoms will go away. Do not drive yourself to the hospital. Summary Hypertension is when the force of blood pumping through your arteries is too strong. If this condition is not controlled, it may put you at risk for serious complications. Your personal target blood pressure may vary depending on your medical conditions, your age, and other factors. For most people, a normal blood pressure is less than 120/80. Hypertension is treated with lifestyle changes, medicines, or a combination of both. Lifestyle changes include losing weight, eating a healthy,   low-sodium diet, exercising more, and limiting alcohol. This information is not intended to replace advice given to you by your health care provider. Make sure you discuss any questions you have with your health care provider. Document Revised: 11/24/2020 Document Reviewed: 11/24/2020 Elsevier Patient Education  2023 [employer]  

## 2022-01-05 NOTE — Progress Notes (Signed)
Erin Benitez 71 y.o.   Chief Complaint  Patient presents with   Abdominal Pain    Patient thinks its gas, or acid reflux   Follow-up    F/u appt DM     HISTORY OF PRESENT ILLNESS: This is a 71 y.o. female here for 32-monthfollow-up of diabetes and hypertension Also has history of GERD with occasional acid reflux symptoms.  Recently had a bout of epigastric pain and bloating.  Started taking pantoprazole and feels much better today.  Asymptomatic at present time. Resent echocardiogram showing normal left ventricular function. Taking glipizide for diabetes.  Not taking Rybelsus. No other complaints or medical concerns today.  Abdominal Pain Pertinent negatives include no dysuria, fever or headaches.     Prior to Admission medications   Medication Sig Start Date End Date Taking? Authorizing Provider  albuterol (VENTOLIN HFA) 108 (90 Base) MCG/ACT inhaler Inhale 2 puffs into the lungs every 6 (six) hours as needed for wheezing (or cough). 06/23/20  Yes Amyot, ANicholes Stairs NP  amLODipine (NORVASC) 10 MG tablet Take 1 tablet (10 mg total) by mouth daily. 07/07/21  Yes Lidie Glade, MInes Bloomer MD  glipiZIDE (GLUCOTROL) 10 MG tablet Take 1 tablet (10 mg total) by mouth daily before breakfast. 07/07/21  Yes Deolinda Frid, MInes Bloomer MD  losartan (COZAAR) 50 MG tablet Take 1 tablet (50 mg total) by mouth daily. 07/07/21  Yes Xin Klawitter, MInes Bloomer MD  metoprolol succinate (TOPROL-XL) 25 MG 24 hr tablet Take 1 tablet (25 mg total) by mouth daily. 07/07/21  Yes SHorald Pollen MD  pantoprazole (PROTONIX) 40 MG tablet Take 1 tablet by mouth once daily 07/07/21  Yes Tijuana Scheidegger, MInes Bloomer MD  rosuvastatin (CRESTOR) 40 MG tablet Take 1 tablet (40 mg total) by mouth daily. 07/07/21  Yes Jennavieve Arrick, MInes Bloomer MD  Semaglutide 7 MG TABS Take 7 mg by mouth daily. 07/07/21  Yes Tanner Yeley, MInes Bloomer MD  zolpidem (AMBIEN) 5 MG tablet Take 1 tablet (5 mg total) by mouth at bedtime as needed for sleep. 10/06/21  Yes  SHorald Pollen MD    Allergies  Allergen Reactions   Lisinopril Swelling   Peanut-Containing Drug Products Anaphylaxis, Hives, Swelling and Other (See Comments)    Pistachio's - swelling of the throat      Tramadol Nausea And Vomiting    Headache & dizziness with n&v    Patient Active Problem List   Diagnosis Date Noted   Insomnia 10/06/2021   NAFLD (nonalcoholic fatty liver disease) 07/07/2021   Protrusion of cervical intervertebral disc 10/21/2020   Spinal stenosis of cervical region 10/21/2020   Chronic left shoulder pain 07/07/2020   Dys[place] with type 2 diabetes [place] 09/06/2017   Diverticulosis of colon without hemorrhage 03/09/2015   Hyperlipidemia 03/09/2015   Gastroesophageal reflux disease without esophagitis 03/09/2015   [place] diabetes [place] 04/01/2013    Past Medical History:  Diagnosis Date   Allergy    Asthma    Bronchitis    Cataract    Diverticulosis    DM2 (diabetes mellitus, type 2) (HCC)    GERD (gastroesophageal reflux disease)    Heart murmur    Hyperplastic colon polyp    Hypertension     Past Surgical History:  Procedure Laterality Date   CESAREAN SECTION     COLONOSCOPY     ESOPHAGOGASTRODUODENOSCOPY     TONSILLECTOMY      Social History   Socioeconomic History   Marital status: Single  Spouse name: Not on file   Number of children: 1   Years of education: Not on file   Highest education level: Not on file  Occupational History   Occupation: Educator  Tobacco Use   Smoking status: Never   Smokeless tobacco: Never  Vaping Use   Vaping Use: Never used  Substance and Sexual Activity   Alcohol use: No   Drug use: No   Sexual activity: Yes  Other Topics Concern   Not on file  Social History Narrative   She reports she is single. She has 1 daughter. She is an Tourist information centre manager.  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     2 caffeinated beverages daily.   No alcohol tobacco or drug use.   Social Determinants of Health   Financial Resource Strain: Low Risk  (11/02/2020)   Overall Financial Resource Strain (CARDIA)    Difficulty of Paying Living Expenses: Not hard at all  Food Insecurity: No Food Insecurity (09/01/2020)   Hunger Vital Sign    Worried About Running Out of Food in the Last Year: Never true    Ran Out of Food in the Last Year: Never true  Transportation Needs: No Transportation Needs (11/02/2020)   PRAPARE - Hydrologist (Medical): No    Lack of Transportation (Non-Medical): No  Physical Activity: Sufficiently Active (11/02/2020)   Exercise Vital Sign    Days of Exercise per Week: 5 days    Minutes of Exercise per Session: 30 min  Stress: No Stress Concern Present (11/02/2020)   [place]    Feeling of Stress : Not at all  Social Connections: Moderately Integrated (11/02/2020)   Social Connection and Isolation Panel [NHANES]    Frequency of Communication with Friends and Family: More than three times a week    Frequency of Social Gatherings with Friends and Family: More than three times a week    Attends Religious Services: More than 4 times per year    Active Member of [employer] or Organizations: Yes    Attends Archivist Meetings: More than 4 times per year    Marital Status: Never married  Intimate Partner Violence: Not At Risk (11/02/2020)   Humiliation, Afraid, Rape, and Kick questionnaire    Fear of Current or Ex-Partner: No    Emotionally Abused: No    Physically Abused: No    Sexually Abused: No    Family History  Problem Relation Age of Onset   Cancer Mother        ovarian cancer   Heart disease Mother    Liver disease Mother    Hyperlipidemia Mother    Stroke Mother    Cancer Father         lung cancer   Cancer Sister    Hyperlipidemia Sister    Cancer Sister    Hyperlipidemia Sister    Diabetes Maternal Aunt    Colon cancer Maternal Aunt        x2 aunts   Colon cancer Maternal Uncle        2 uncles   Colon cancer Paternal Grandfather    Esophageal cancer Neg Hx    Rectal cancer Neg Hx    Stomach cancer Neg Hx      Review of Systems  Constitutional:  Negative for chills and fever.  HENT: Negative.  Negative for congestion and sore throat.   Respiratory: Negative.  Negative for cough and shortness of breath.   Cardiovascular: Negative.  Negative for chest pain and palpitations.  Gastrointestinal:  Positive for abdominal pain and heartburn.  Genitourinary: Negative.  Negative for dysuria.  Skin: Negative.  Negative for rash.  Neurological:  Negative for dizziness and headaches.  All other systems reviewed and are negative.  Today's Vitals   01/05/22 0806  BP: 136/88  Pulse: 82  Temp: 98.2 F (36.8 C)  TempSrc: Oral  SpO2: 98%  Weight: 156 lb 2 oz (70.8 kg)  Height: '5\' 4"'$  (1.626 m)   Body mass index is 26.8 kg/m. Wt Readings from Last 3 Encounters:  01/05/22 156 lb 2 oz (70.8 kg)  10/06/21 149 lb 8 oz (67.8 kg)  08/04/21 146 lb 6.4 oz (66.4 kg)     Physical Exam Vitals reviewed.  Constitutional:      Appearance: She is well-developed.  HENT:     Head: Normocephalic.     Mouth/Throat:     Mouth: Mucous membranes are moist.     Pharynx: Oropharynx is clear.  Eyes:     Extraocular Movements: Extraocular movements intact.     Pupils: Pupils are equal, round, and reactive to light.  Cardiovascular:     Rate and Rhythm: Normal rate and regular rhythm.     Heart sounds: Murmur heard.  Pulmonary:     Effort: Pulmonary effort is normal.     Breath sounds: Normal breath sounds.  Abdominal:     Palpations: Abdomen is soft.     Tenderness: There is no abdominal tenderness.  Musculoskeletal:        General: Normal range of motion.  Skin:     General: Skin is warm and dry.  Neurological:     General: No focal deficit present.     Mental Status: She is alert and oriented to person, place, and time.  Psychiatric:        Mood and Affect: Mood normal.        Behavior: Behavior normal.      ASSESSMENT & PLAN: A total of 45 minutes was spent with the patient and counseling/coordination of care regarding preparing for this visit, review of most recent office visit notes, review of most recent echocardiogram report, review of upper endoscopy and colonoscopy report from 2021, review of multiple chronic medical problems and their management, review of all medications, review of most recent blood work results including interpretation of today's hemoglobin A1c, cardiovascular risks associated with diabetes and hypertension, education on nutrition, review of health maintenance items, prognosis, documentation, and need for follow-up.  Problem List Items Addressed This Visit       Cardiovascular and Mediastinum   Hypertension associated with diabetes ([place]) - Primary    Well-controlled hypertension with normal blood pressure readings at home.  Elevated blood pressure reading in the office.  Ran out of amlodipine.  Needs a refill. Continue losartan 50 mg and metoprolol succinate 25 mg daily. Well-controlled diabetes with hemoglobin A1c at 6.1. Continue glipizide 10 mg daily before breakfast.  Not taking Rybelsus at present time due to cost. Cardiovascular risks associated with hypertension and diabetes discussed. Diet and nutrition discussed. Follow-up in 6 months.      Relevant Medications   amLODipine (NORVASC) 10 MG tablet   Other Relevant Orders   Comprehensive metabolic panel   CBC with Differential/Platelet   POCT glycosylated hemoglobin (  Hb A1C) (Completed)     Digestive   Gastroesophageal reflux disease without esophagitis    Upper endoscopy from May 2021 showed multiple gastric erosions. Clinically no signs of bleeding.  No  melena. Start pantoprazole 40 mg daily for 1 month. Diet nutrition discussed.      Relevant Medications   pantoprazole (PROTONIX) 40 MG tablet     Endocrine   Dyslipidemia associated with type 2 diabetes mellitus ([place])    Stable.  Diet and nutrition discussed.  Continue rosuvastatin 40 mg daily.      Relevant Orders   CBC with Differential/Platelet   Lipid panel   Other Visit Diagnoses     Epigastric pain       Relevant Medications   pantoprazole (PROTONIX) 40 MG tablet   Essential hypertension       Relevant Medications   amLODipine (NORVASC) 10 MG tablet      Patient Instructions  Hypertension, Adult High blood pressure (hypertension) is when the force of blood pumping through the arteries is too strong. The arteries are the blood vessels that carry blood from the heart throughout the body. Hypertension forces the heart to work harder to pump blood and may cause arteries to become narrow or stiff. Untreated or uncontrolled hypertension can lead to a heart attack, heart failure, a stroke, kidney disease, and other problems. A blood pressure reading consists of a higher number over a lower number. Ideally, your blood pressure should be below 120/80. The first ("top") number is called the systolic pressure. It is a measure of the pressure in your arteries as your heart beats. The second ("bottom") number is called the diastolic pressure. It is a measure of the pressure in your arteries as the heart relaxes. What are the causes? The exact cause of this condition is not known. There are some conditions that result in high blood pressure. What increases the risk? Certain factors may make you more likely to develop high blood pressure. Some of these risk factors are under your control, including: Smoking. Not getting enough exercise or physical activity. Being overweight. Having too much fat, sugar, calories, or salt (sodium) in your diet. Drinking too much alcohol. Other risk  factors include: Having a personal history of heart disease, diabetes, high cholesterol, or kidney disease. Stress. Having a family history of high blood pressure and high cholesterol. Having obstructive sleep apnea. Age. The risk increases with age. What are the signs or symptoms? High blood pressure may not cause symptoms. Very high blood pressure (hypertensive crisis) may cause: Headache. Fast or irregular heartbeats (palpitations). Shortness of breath. Nosebleed. Nausea and vomiting. Vision changes. Severe chest pain, dizziness, and seizures. How is this diagnosed? This condition is diagnosed by measuring your blood pressure while you are seated, with your arm resting on a flat surface, your legs uncrossed, and your feet flat on the floor. The cuff of the blood pressure monitor will be placed directly against the skin of your upper arm at the level of your heart. Blood pressure should be measured at least twice using the same arm. Certain conditions can cause a difference in blood pressure between your right and left arms. If you have a high blood pressure reading during one visit or you have normal blood pressure with other risk factors, you may be asked to: Return on a different day to have your blood pressure checked again. Monitor your blood pressure at home for 1 week or longer. If you are diagnosed with hypertension, you may have  other blood or imaging tests to help your health care provider understand your overall risk for other conditions. How is this treated? This condition is treated by making healthy lifestyle changes, such as eating healthy foods, exercising more, and reducing your alcohol intake. You may be referred for counseling on a healthy diet and physical activity. Your health care provider may prescribe medicine if lifestyle changes are not enough to get your blood pressure under control and if: Your systolic blood pressure is above 130. Your diastolic blood pressure  is above 80. Your personal target blood pressure may vary depending on your medical conditions, your age, and other factors. Follow these instructions at home: Eating and drinking  Eat a diet that is high in fiber and potassium, and low in sodium, added sugar, and fat. An example of this eating plan is called the DASH diet. DASH stands for Dietary Approaches to Stop Hypertension. To eat this way: Eat plenty of fresh fruits and vegetables. Try to fill one half of your plate at each meal with fruits and vegetables. Eat whole grains, such as whole-wheat pasta, brown rice, or whole-grain bread. Fill about one fourth of your plate with whole grains. Eat or drink low-fat dairy products, such as skim milk or low-fat yogurt. Avoid fatty cuts of meat, processed or cured meats, and poultry with skin. Fill about one fourth of your plate with lean proteins, such as fish, chicken without skin, beans, eggs, or tofu. Avoid pre-made and processed foods. These tend to be higher in sodium, added sugar, and fat. Reduce your daily sodium intake. Many people with hypertension should eat less than 1,500 mg of sodium a day. Do not drink alcohol if: Your health care provider tells you not to drink. You are pregnant, may be pregnant, or are planning to become pregnant. If you drink alcohol: Limit how much you have to: 0-1 drink a day for women. 0-2 drinks a day for men. Know how much alcohol is in your drink. In the [place] one drink equals one 12 oz bottle of beer (355 mL), one 5 oz glass of wine (148 mL), or one 1 oz glass of hard liquor (44 mL). Lifestyle  Work with your health care provider to maintain a healthy body weight or to lose weight. Ask what an ideal weight is for you. Get at least 30 minutes of exercise that causes your heart to beat faster (aerobic exercise) most days of the week. Activities may include walking, swimming, or biking. Include exercise to strengthen your muscles (resistance exercise),  such as Pilates or lifting weights, as part of your weekly exercise routine. Try to do these types of exercises for 30 minutes at least 3 days a week. Do not use any products that contain nicotine or tobacco. These products include cigarettes, chewing tobacco, and vaping devices, such as e-cigarettes. If you need help quitting, ask your health care provider. Monitor your blood pressure at home as told by your health care provider. Keep all follow-up visits. This is important. Medicines Take over-the-counter and prescription medicines only as told by your health care provider. Follow directions carefully. Blood pressure medicines must be taken as prescribed. Do not skip doses of blood pressure medicine. Doing this puts you at risk for problems and can make the medicine less effective. Ask your health care provider about side effects or reactions to medicines that you should watch for. Contact a health care provider if you: Think you are having a reaction to a medicine you are  taking. Have headaches that keep coming back (recurring). Feel dizzy. Have swelling in your ankles. Have trouble with your vision. Get help right away if you: Develop a severe headache or confusion. Have unusual weakness or numbness. Feel faint. Have severe pain in your chest or abdomen. Vomit repeatedly. Have trouble breathing. These symptoms may be an emergency. Get help right away. Call 911. Do not wait to see if the symptoms will go away. Do not drive yourself to the hospital. Summary Hypertension is when the force of blood pumping through your arteries is too strong. If this condition is not controlled, it may put you at risk for serious complications. Your personal target blood pressure may vary depending on your medical conditions, your age, and other factors. For most people, a normal blood pressure is less than 120/80. Hypertension is treated with lifestyle changes, medicines, or a combination of both.  Lifestyle changes include losing weight, eating a healthy, low-sodium diet, exercising more, and limiting alcohol. This information is not intended to replace advice given to you by your health care provider. Make sure you discuss any questions you have with your health care provider. Document Revised: 11/24/2020 Document Reviewed: 11/24/2020 Elsevier Patient Education  [place], MD [facility]

## 2022-01-05 NOTE — Assessment & Plan Note (Addendum)
Well-controlled hypertension with normal blood pressure readings at home.  Elevated blood pressure reading in the office.  Ran out of amlodipine.  Needs a refill. Continue losartan 50 mg and metoprolol succinate 25 mg daily. Well-controlled diabetes with hemoglobin A1c at 6.1. Continue glipizide 10 mg daily before breakfast.  Not taking Rybelsus at present time due to cost. Cardiovascular risks associated with hypertension and diabetes discussed. Diet and nutrition discussed. Follow-up in 6 months.

## 2022-01-05 NOTE — Assessment & Plan Note (Signed)
Stable.  Diet and nutrition discussed.  Continue rosuvastatin 40 mg daily.

## 2022-03-16 ENCOUNTER — Ambulatory Visit (INDEPENDENT_AMBULATORY_CARE_PROVIDER_SITE_OTHER): Payer: PPO

## 2022-03-16 ENCOUNTER — Ambulatory Visit (INDEPENDENT_AMBULATORY_CARE_PROVIDER_SITE_OTHER): Payer: PPO | Admitting: Physician Assistant

## 2022-03-16 ENCOUNTER — Encounter: Payer: Self-pay | Admitting: Physician Assistant

## 2022-03-16 DIAGNOSIS — M79644 Pain in right finger(s): Secondary | ICD-10-CM

## 2022-03-16 DIAGNOSIS — M65341 Trigger finger, right ring finger: Secondary | ICD-10-CM | POA: Diagnosis not present

## 2022-03-16 MED ORDER — METHYLPREDNISOLONE ACETATE 40 MG/ML IJ SUSP
20.0000 mg | INTRAMUSCULAR | Status: AC | PRN
  Administered 2022-03-16: 20 mg

## 2022-03-16 MED ORDER — LIDOCAINE HCL 1 % IJ SOLN
0.5000 mL | INTRAMUSCULAR | Status: AC | PRN
  Administered 2022-03-16: .5 mL

## 2022-03-16 NOTE — Progress Notes (Signed)
Office Visit Note   Patient: Erin Benitez           Date of Birth: [birth year]           MRN: [id number] Visit Date: 03/16/2022              Requested by: Horald Pollen, [place],  [place] [id number] PCP: Horald Pollen, MD  Trigger finger    HPI: Patient is a pleasant 72 year old woman who presents today with a right ring finger pain and swelling.  Her only change in activity was taking her ring off of this finger a while back.  She denies any falls.  She says it gets stuck and she has trouble strength straightening it  Assessment & Plan: Visit Diagnoses:  1. Pain in right finger(s)   2. Trigger finger, right ring finger     Plan: Exam is consistent with a trigger finger of the right ring finger.  Talked to her about options.  She has tried topical anti-inflammatories we will go forward with an injection today.  She has had injections in her shoulder before and its worked quite well  Follow-Up Instructions: Return if symptoms worsen or fail to improve.   Ortho Exam  Patient is alert, oriented, no adenopathy, well-dressed, normal affect, normal respiratory effort. Examination her fingers are warm pink brisk capillary refill palpable radial pulse.  No redness no cellulitis.  She does have triggering of the ring finger over the A1 pulley  Imaging: XR Finger Ring Right  Result Date: 03/16/2022 2 view radiographs of her right ring finger demonstrate well-maintained alignment.  No evidence of any fracture minimal degenerative changes  No images are attached to the encounter.  Labs: Lab Results  Component Value Date   HGBA1C 6.1 (A) 01/05/2022   HGBA1C 6.6 (A) 10/06/2021   HGBA1C 8.7 (A) 07/07/2021     Lab Results  Component Value Date   ALBUMIN 4.6 01/05/2022   ALBUMIN 4.6 07/07/2021   ALBUMIN 4.6 05/13/2021    No results found for: "MG" No results found for: "VD25OH"  No results found for: "PREALBUMIN"    Latest Ref Rng &  Units 01/05/2022    8:35 AM 07/07/2021    9:03 AM 03/23/2021    8:44 AM  CBC EXTENDED  WBC 4.0 - 10.5 K/uL 4.4  3.9  4.3   RBC 3.87 - 5.11 Mil/uL 4.35  4.40  4.53   Hemoglobin 12.0 - 15.0 g/dL 12.7  12.1  12.3   HCT 36.0 - 46.0 % 38.1  37.0  38.2   Platelets 150.0 - 400.0 K/uL 258.0  284.0  250.0   NEUT# 1.4 - 7.7 K/uL 2.5  2.0  2.3   Lymph# 0.7 - 4.0 K/uL 1.4  1.4  1.4      There is no height or weight on file to calculate BMI.  Orders:  Orders Placed This Encounter  Procedures   Hand/UE Inj   XR Finger Ring Right   No orders of the defined types were placed in this encounter.    Procedures: Hand/UE Inj for trigger finger on 03/16/2022 8:46 AM Indications: diagnostic and therapeutic Details: 22 G needle Medications: 0.5 mL lidocaine 1 %; 20 mg methylPREDNISolone acetate 40 MG/ML Outcome: tolerated well, no immediate complications Procedure, treatment alternatives, risks and benefits explained, specific risks discussed. Consent was given by the patient.     Clinical Data: No additional findings.  ROS:  All other systems negative, except  as noted in the HPI. Review of Systems  Objective: Vital Signs: There were no vitals taken for this visit.  Specialty Comments:  No specialty comments available.  PMFS History: Patient Active Problem List   Diagnosis Date Noted   Trigger finger, right ring finger 03/16/2022   Insomnia 10/06/2021   NAFLD (nonalcoholic fatty liver disease) 07/07/2021   Protrusion of cervical intervertebral disc 10/21/2020   Spinal stenosis of cervical region 10/21/2020   Chronic left shoulder pain 07/07/2020   Dyslipidemia associated with type 2 diabetes mellitus ([place]) 09/06/2017   Diverticulosis of colon without hemorrhage 03/09/2015   Hyperlipidemia 03/09/2015   Gastroesophageal reflux disease without esophagitis 03/09/2015   Hypertension associated with diabetes ([place]) 04/01/2013   Past Medical History:  Diagnosis Date   Allergy    Asthma     Bronchitis    Cataract    Diverticulosis    DM2 (diabetes mellitus, type 2) (HCC)    GERD (gastroesophageal reflux disease)    Heart murmur    Hyperplastic colon polyp    Hypertension     Family History  Problem Relation Age of Onset   Cancer Mother        ovarian cancer   Heart disease Mother    Liver disease Mother    Hyperlipidemia Mother    Stroke Mother    Cancer Father        lung cancer   Cancer Sister    Hyperlipidemia Sister    Cancer Sister    Hyperlipidemia Sister    Diabetes Maternal Aunt    Colon cancer Maternal Aunt        x2 aunts   Colon cancer Maternal Uncle        2 uncles   Colon cancer Paternal Grandfather    Esophageal cancer Neg Hx    Rectal cancer Neg Hx    Stomach cancer Neg Hx     Past Surgical History:  Procedure Laterality Date   CESAREAN SECTION     COLONOSCOPY     ESOPHAGOGASTRODUODENOSCOPY     TONSILLECTOMY     Social History   Occupational History   Occupation: Tourist information centre manager  Tobacco Use   Smoking status: Never   Smokeless tobacco: Never  Vaping Use   Vaping Use: Never used  Substance and Sexual Activity   Alcohol use: No   Drug use: No   Sexual activity: Yes

## 2022-05-27 ENCOUNTER — Telehealth: Payer: Self-pay

## 2022-05-27 NOTE — Telephone Encounter (Signed)
PA approved.   26-APR-24:26-APR-25 Rybelsus 7MG  OR TABS Quantity:30;

## 2022-05-27 NOTE — Telephone Encounter (Signed)
PA initiated via Covermymeds; KEY: [id number]. Awaiting determination.

## 2022-06-08 ENCOUNTER — Other Ambulatory Visit: Payer: Self-pay | Admitting: Emergency Medicine

## 2022-06-08 DIAGNOSIS — I152 Hypertension secondary to endocrine disorders: Secondary | ICD-10-CM

## 2022-07-06 ENCOUNTER — Ambulatory Visit (INDEPENDENT_AMBULATORY_CARE_PROVIDER_SITE_OTHER): Payer: PPO | Admitting: Emergency Medicine

## 2022-07-06 ENCOUNTER — Encounter: Payer: Self-pay | Admitting: Emergency Medicine

## 2022-07-06 VITALS — BP 128/80 | HR 71 | Temp 98.0°F | Ht 64.0 in | Wt 155.1 lb

## 2022-07-06 DIAGNOSIS — K760 Fatty (change of) liver, not elsewhere classified: Secondary | ICD-10-CM

## 2022-07-06 DIAGNOSIS — E1159 Type 2 diabetes mellitus with other circulatory complications: Secondary | ICD-10-CM

## 2022-07-06 DIAGNOSIS — E785 Hyperlipidemia, unspecified: Secondary | ICD-10-CM | POA: Diagnosis not present

## 2022-07-06 DIAGNOSIS — E1169 Type 2 diabetes mellitus with other specified complication: Secondary | ICD-10-CM | POA: Diagnosis not present

## 2022-07-06 DIAGNOSIS — I152 Hypertension secondary to endocrine disorders: Secondary | ICD-10-CM

## 2022-07-06 DIAGNOSIS — K219 Gastro-esophageal reflux disease without esophagitis: Secondary | ICD-10-CM

## 2022-07-06 LAB — LIPID PANEL
Cholesterol: 130 mg/dL (ref 0–200)
HDL: 45.5 mg/dL (ref >39.00)
LDL Cholesterol: 59 mg/dL (ref 0–99)
NonHDL: 84.48
Total CHOL/HDL Ratio: 3
Triglycerides: 128 mg/dL (ref 0.0–149.0)
VLDL: 25.6 mg/dL (ref 0.0–40.0)

## 2022-07-06 LAB — COMPREHENSIVE METABOLIC PANEL
ALT: 13 U/L (ref 0–35)
AST: 18 U/L (ref 0–37)
Albumin: 4.7 g/dL (ref 3.5–5.2)
Alkaline Phosphatase: 96 U/L (ref 39–117)
BUN: 14 mg/dL (ref 6–23)
CO2: 23 mEq/L (ref 19–32)
Calcium: 9.6 mg/dL (ref 8.4–10.5)
Chloride: 102 mEq/L (ref 96–112)
Creatinine, Ser: 0.75 mg/dL (ref 0.40–1.20)
GFR: 79.96 mL/min (ref >60.00)
Glucose, Bld: 126 mg/dL — ABNORMAL HIGH (ref 70–99)
Potassium: 4.3 mEq/L (ref 3.5–5.1)
Sodium: 140 mEq/L (ref 135–145)
Total Bilirubin: 0.5 mg/dL (ref 0.2–1.2)
Total Protein: 8.2 g/dL (ref 6.0–8.3)

## 2022-07-06 LAB — CBC WITH DIFFERENTIAL/PLATELET
Basophils Absolute: 0 10*3/uL (ref 0.0–0.1)
Basophils Relative: 0.4 % (ref 0.0–3.0)
Eosinophils Absolute: 0.1 10*3/uL (ref 0.0–0.7)
Eosinophils Relative: 3.3 % (ref 0.0–5.0)
HCT: 39.3 % (ref 36.0–46.0)
Hemoglobin: 12.7 g/dL (ref 12.0–15.0)
Lymphocytes Relative: 32.6 % (ref 12.0–46.0)
Lymphs Abs: 1.5 10*3/uL (ref 0.7–4.0)
MCHC: 32.4 g/dL (ref 30.0–36.0)
MCV: 86.1 fl (ref 78.0–100.0)
Monocytes Absolute: 0.4 10*3/uL (ref 0.1–1.0)
Monocytes Relative: 9.4 % (ref 3.0–12.0)
Neutro Abs: 2.4 10*3/uL (ref 1.4–7.7)
Neutrophils Relative %: 54.3 % (ref 43.0–77.0)
Platelets: 250 10*3/uL (ref 150.0–400.0)
RBC: 4.56 Mil/uL (ref 3.87–5.11)
RDW: 13.6 % (ref 11.5–15.5)
WBC: 4.5 10*3/uL (ref 4.0–10.5)

## 2022-07-06 LAB — POCT GLYCOSYLATED HEMOGLOBIN (HGB A1C): Hemoglobin A1C: 8.1 % — AB (ref 4.0–5.6)

## 2022-07-06 NOTE — Patient Instructions (Signed)

## 2022-07-06 NOTE — Assessment & Plan Note (Signed)
Diet and nutrition discussed CMP done today Continue rosuvastatin 40 mg daily The 10-year ASCVD risk score (Arnett DK, et al., 2019) is: 22%   Values used to calculate the score:     Age: 72 years     Sex: Female     Is Non-Hispanic African American: Yes     Diabetic: Yes     Tobacco smoker: No     Systolic Blood Pressure: 128 mmHg     Is BP treated: Yes     HDL Cholesterol: 50.1 mg/dL     Total Cholesterol: 154 mg/dL

## 2022-07-06 NOTE — Assessment & Plan Note (Signed)
Intermittent pains History of multiple gastric erosions Recommend to start Protonix 40 mg daily

## 2022-07-06 NOTE — Assessment & Plan Note (Signed)
Diet and nutrition discussed Cardiovascular risks associated with dyslipidemia and diabetes discussed Continue rosuvastatin 40 mg daily

## 2022-07-06 NOTE — Progress Notes (Signed)
Erin Benitez 72 y.o.   Chief Complaint  Patient presents with   Medical Management of Chronic Issues    f/u appt, patient still having headaches and stomach pains     HISTORY OF PRESENT ILLNESS: This is a 72 y.o. female here for 2-month follow-up of chronic medical conditions Occasional headaches and upper abdominal burning pain History of multiple gastric erosions as shown and upper endoscopy 2021 Colonoscopy in 2021 showed diverticulosis Works as a Runner, broadcasting/film/video.  Stressful job. Diabetic.  On glipizide 10 mg daily.  Started Rybelsus 7 mg 10 days ago. No other complaints or medical concerns today. BP Readings from Last 3 Encounters:  01/05/22 136/88  10/06/21 138/78  08/04/21 (!) 146/78   Lab Results  Component Value Date   HGBA1C 6.1 (A) 01/05/2022     HPI   Prior to Admission medications   Medication Sig Start Date End Date Taking? Authorizing Provider  albuterol (VENTOLIN HFA) 108 (90 Base) MCG/ACT inhaler Inhale 2 puffs into the lungs every 6 (six) hours as needed for wheezing (or cough). 06/23/20  Yes Amyot, Ali Lowe, NP  amLODipine (NORVASC) 10 MG tablet Take 1 tablet (10 mg total) by mouth daily. 01/05/22  Yes Angellina Ferdinand, Eilleen Kempf, MD  glipiZIDE (GLUCOTROL) 10 MG tablet TAKE 1 TABLET BY MOUTH ONCE DAILY BEFORE BREAKFAST 06/08/22  Yes Shanise Balch, Eilleen Kempf, MD  losartan (COZAAR) 50 MG tablet Take 1 tablet (50 mg total) by mouth daily. 07/07/21  Yes Cavon Nicolls, Eilleen Kempf, MD  metoprolol succinate (TOPROL-XL) 25 MG 24 hr tablet Take 1 tablet (25 mg total) by mouth daily. 07/07/21  Yes Damareon Lanni, Eilleen Kempf, MD  pantoprazole (PROTONIX) 40 MG tablet Take 1 tablet (40 mg total) by mouth daily. 01/05/22  Yes Salam Chesterfield, Eilleen Kempf, MD  rosuvastatin (CRESTOR) 40 MG tablet Take 1 tablet (40 mg total) by mouth daily. 07/07/21  Yes Zora Glendenning, Eilleen Kempf, MD  Semaglutide 7 MG TABS Take 7 mg by mouth daily. 07/07/21  Yes Kwana Ringel, Eilleen Kempf, MD  zolpidem (AMBIEN) 5 MG tablet Take 1 tablet  (5 mg total) by mouth at bedtime as needed for sleep. 10/06/21  Yes Georgina Quint, MD    Allergies  Allergen Reactions   Lisinopril Swelling   Peanut-Containing Drug Products Anaphylaxis, Hives, Swelling and Other (See Comments)    Pistachio's - swelling of the throat      Tramadol Nausea And Vomiting    Headache & dizziness with n&v    Patient Active Problem List   Diagnosis Date Noted   Trigger finger, right ring finger 03/16/2022   Insomnia 10/06/2021   NAFLD (nonalcoholic fatty liver disease) 16/11/9602   Protrusion of cervical intervertebral disc 10/21/2020   Spinal stenosis of cervical region 10/21/2020   Chronic left shoulder pain 07/07/2020   Dyslipidemia associated with type 2 diabetes mellitus (HCC) 09/06/2017   Diverticulosis of colon without hemorrhage 03/09/2015   Hyperlipidemia 03/09/2015   Gastroesophageal reflux disease without esophagitis 03/09/2015   Hypertension associated with diabetes (HCC) 04/01/2013    Past Medical History:  Diagnosis Date   Allergy    Asthma    Bronchitis    Cataract    Diverticulosis    DM2 (diabetes mellitus, type 2) (HCC)    GERD (gastroesophageal reflux disease)    Heart murmur    Hyperplastic colon polyp    Hypertension     Past Surgical History:  Procedure Laterality Date   CESAREAN SECTION     COLONOSCOPY     ESOPHAGOGASTRODUODENOSCOPY  TONSILLECTOMY      Social History   Socioeconomic History   Marital status: Single    Spouse name: Not on file   Number of children: 1   Years of education: Not on file   Highest education level: Not on file  Occupational History   Occupation: Educator  Tobacco Use   Smoking status: Never   Smokeless tobacco: Never  Vaping Use   Vaping Use: Never used  Substance and Sexual Activity   Alcohol use: No   Drug use: No   Sexual activity: Yes  Other Topics Concern   Not on file  Social History Narrative   She reports she is single. She has 1 daughter. She is an  Programmer, systems.  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     2 caffeinated beverages daily.   No alcohol tobacco or drug use.   Social Determinants of Health   Financial Resource Strain: Medium Risk (07/05/2022)   Overall Financial Resource Strain (CARDIA)    Difficulty of Paying Living Expenses: Somewhat hard  Food Insecurity: Food Insecurity Present (07/05/2022)   Hunger Vital Sign    Worried About Running Out of Food in the Last Year: Often true    Ran Out of Food in the Last Year: Patient declined  Transportation Needs: No Transportation Needs (07/05/2022)   PRAPARE - Administrator, Civil Service (Medical): No    Lack of Transportation (Non-Medical): No  Physical Activity: Insufficiently Active (07/05/2022)   Exercise Vital Sign    Days of Exercise per Week: 2 days    Minutes of Exercise per Session: 30 min  Stress: Stress Concern Present (07/05/2022)   [employer] of Occupational Health - Occupational Stress Questionnaire    Feeling of Stress : To some extent  Social Connections: Unknown (07/05/2022)   Social Connection and Isolation Panel [NHANES]    Frequency of Communication with Friends and Family: More than three times a week    Frequency of Social Gatherings with Friends and Family: More than three times a week    Attends Religious Services: More than 4 times per year    Active Member of [employer] or Organizations: Not on file    Attends Banker Meetings: Not on file    Marital Status: Divorced  Intimate Partner Violence: Not At Risk (11/02/2020)   Humiliation, Afraid, Rape, and Kick questionnaire    Fear of Current or Ex-Partner: No    Emotionally Abused: No    Physically Abused: No    Sexually Abused: No    Family History  Problem Relation Age of Onset   Cancer Mother        ovarian cancer   Heart disease Mother     Liver disease Mother    Hyperlipidemia Mother    Stroke Mother    Cancer Father        lung cancer   Cancer Sister    Hyperlipidemia Sister    Cancer Sister    Hyperlipidemia Sister    Diabetes Maternal Aunt    Colon cancer Maternal Aunt        x2 aunts   Colon cancer Maternal Uncle        2 uncles   Colon cancer Paternal Grandfather    Esophageal cancer Neg Hx    Rectal cancer Neg Hx    Stomach cancer  Neg Hx      Review of Systems  Constitutional: Negative.  Negative for chills and fever.  HENT: Negative.  Negative for congestion and sore throat.   Respiratory: Negative.  Negative for cough and shortness of breath.   Cardiovascular: Negative.  Negative for chest pain and palpitations.  Gastrointestinal:  Positive for abdominal pain. Negative for blood in stool, melena, nausea and vomiting.  Genitourinary: Negative.  Negative for dysuria and urgency.  Skin: Negative.  Negative for rash.  Neurological:  Positive for headaches. Negative for dizziness.  All other systems reviewed and are negative.   Today's Vitals   07/06/22 0804  BP: 128/80  Pulse: 71  Temp: 98 F (36.7 C)  TempSrc: Oral  SpO2: 97%  Weight: 155 lb 2 oz (70.4 kg)  Height: 5\' 4"  (1.626 m)   Body mass index is 26.63 kg/m. Wt Readings from Last 3 Encounters:  07/06/22 155 lb 2 oz (70.4 kg)  01/05/22 156 lb 2 oz (70.8 kg)  10/06/21 149 lb 8 oz (67.8 kg)     Physical Exam Vitals reviewed.  Constitutional:      Appearance: Normal appearance.  HENT:     Head: Normocephalic.  Eyes:     Extraocular Movements: Extraocular movements intact.  Cardiovascular:     Rate and Rhythm: Normal rate and regular rhythm.     Pulses: Normal pulses.     Heart sounds: Murmur (Systolic 2/6 aortic) heard.  Pulmonary:     Effort: Pulmonary effort is normal.     Breath sounds: Normal breath sounds.  Abdominal:     Palpations: Abdomen is soft.     Tenderness: There is no abdominal tenderness.  Skin:     General: Skin is warm and dry.     Capillary Refill: Capillary refill takes less than 2 seconds.  Neurological:     Mental Status: She is alert and oriented to person, place, and time.  Psychiatric:        Mood and Affect: Mood normal.        Behavior: Behavior normal.    Results for orders placed or performed in visit on 07/06/22 (from the past 24 hour(s))  POCT HgB A1C     Status: Abnormal   Collection Time: 07/06/22  8:35 AM  Result Value Ref Range   Hemoglobin A1C 8.1 (A) 4.0 - 5.6 %   HbA1c POC (<> result, manual entry)     HbA1c, POC (prediabetic range)     HbA1c, POC (controlled diabetic range)       ASSESSMENT & PLAN: A total of 45 minutes was spent with the patient and counseling/coordination of care regarding preparing for this visit, review of most recent office visit notes, review of multiple chronic medical conditions and their management, review of all medications, review of most recent blood work results including interpretation of today's hemoglobin A1c, cardiovascular risks associated with uncontrolled diabetes, education on nutrition, prognosis, documentation and need for follow-up.  Problem List Items Addressed This Visit       Cardiovascular and Mediastinum   Hypertension associated with diabetes (HCC) - Primary    BP Readings from Last 3 Encounters:  07/06/22 128/80  01/05/22 136/88  10/06/21 138/78  Well-controlled hypertension Continue amlodipine 10 mg and losartan 50 mg daily Uncontrolled diabetes with hemoglobin A1c at 8.1 Still taking glipizide 10 mg in the morning. Just started Rybelsus 7 mg daily 10 days ago Cardiovascular risks associated with uncontrolled diabetes discussed Diet and nutrition discussed Blood work done  today Follow-up in 3 months       Relevant Orders   POCT HgB A1C   Comprehensive metabolic panel   CBC with Differential/Platelet   Lipid panel     Digestive   Gastroesophageal reflux disease without esophagitis     Intermittent pains History of multiple gastric erosions Recommend to start Protonix 40 mg daily      NAFLD (nonalcoholic fatty liver disease)    Diet and nutrition discussed CMP done today Continue rosuvastatin 40 mg daily The 10-year ASCVD risk score (Arnett DK, et al., 2019) is: 22%   Values used to calculate the score:     Age: 55 years     Sex: Female     Is Non-Hispanic African American: Yes     Diabetic: Yes     Tobacco smoker: No     Systolic Blood Pressure: 128 mmHg     Is BP treated: Yes     HDL Cholesterol: 50.1 mg/dL     Total Cholesterol: 154 mg/dL       Relevant Orders   Lipid panel     Endocrine   Dyslipidemia associated with type 2 diabetes mellitus (HCC)    Diet and nutrition discussed Cardiovascular risks associated with dyslipidemia and diabetes discussed Continue rosuvastatin 40 mg daily      Relevant Orders   Lipid panel   Patient Instructions  Diabetes Mellitus and Nutrition, Adult When you have diabetes, or diabetes mellitus, it is very important to have healthy eating habits because your blood sugar (glucose) levels are greatly affected by what you eat and drink. Eating healthy foods in the right amounts, at about the same times every day, can help you: Manage your blood glucose. Lower your risk of heart disease. Improve your blood pressure. Reach or maintain a healthy weight. What can affect my meal plan? Every person with diabetes is different, and each person has different needs for a meal plan. Your health care provider may recommend that you work with a dietitian to make a meal plan that is best for you. Your meal plan may vary depending on factors such as: The calories you need. The medicines you take. Your weight. Your blood glucose, blood pressure, and cholesterol levels. Your activity level. Other health conditions you have, such as heart or kidney disease. How do carbohydrates affect me? Carbohydrates, also called carbs, affect your  blood glucose level more than any other type of food. Eating carbs raises the amount of glucose in your blood. It is important to know how many carbs you can safely have in each meal. This is different for every person. Your dietitian can help you calculate how many carbs you should have at each meal and for each snack. How does alcohol affect me? Alcohol can cause a decrease in blood glucose (hypoglycemia), especially if you use insulin or take certain diabetes medicines by mouth. Hypoglycemia can be a life-threatening condition. Symptoms of hypoglycemia, such as sleepiness, dizziness, and confusion, are similar to symptoms of having too much alcohol. Do not drink alcohol if: Your health care provider tells you not to drink. You are pregnant, may be pregnant, or are planning to become pregnant. If you drink alcohol: Limit how much you have to: 0-1 drink a day for women. 0-2 drinks a day for men. Know how much alcohol is in your drink. In the [place] one drink equals one 12 oz bottle of beer (355 mL), one 5 oz glass of wine (148 mL), or one 1  oz glass of hard liquor (44 mL). Keep yourself hydrated with water, diet soda, or unsweetened iced tea. Keep in mind that regular soda, juice, and other mixers may contain a lot of sugar and must be counted as carbs. What are tips for following this plan?  Reading food labels Start by checking the serving size on the Nutrition Facts label of packaged foods and drinks. The number of calories and the amount of carbs, fats, and other nutrients listed on the label are based on one serving of the item. Many items contain more than one serving per package. Check the total grams (g) of carbs in one serving. Check the number of grams of saturated fats and trans fats in one serving. Choose foods that have a low amount or none of these fats. Check the number of milligrams (mg) of salt (sodium) in one serving. Most people should limit total sodium intake to less than  2,300 mg per day. Always check the nutrition information of foods labeled as "low-fat" or "nonfat." These foods may be higher in added sugar or refined carbs and should be avoided. Talk to your dietitian to identify your daily goals for nutrients listed on the label. Shopping Avoid buying canned, pre-made, or processed foods. These foods tend to be high in fat, sodium, and added sugar. Shop around the outside edge of the grocery store. This is where you will most often find fresh fruits and vegetables, bulk grains, fresh meats, and fresh dairy products. Cooking Use low-heat cooking methods, such as baking, instead of high-heat cooking methods, such as deep frying. Cook using healthy oils, such as olive, canola, or sunflower oil. Avoid cooking with butter, cream, or high-fat meats. Meal planning Eat meals and snacks regularly, preferably at the same times every day. Avoid going long periods of time without eating. Eat foods that are high in fiber, such as fresh fruits, vegetables, beans, and whole grains. Eat 4-6 oz (112-168 g) of lean protein each day, such as lean meat, chicken, fish, eggs, or tofu. One ounce (oz) (28 g) of lean protein is equal to: 1 oz (28 g) of meat, chicken, or fish. 1 egg.  cup (62 g) of tofu. Eat some foods each day that contain healthy fats, such as avocado, nuts, seeds, and fish. What foods should I eat? Fruits Berries. Apples. Oranges. Peaches. Apricots. Plums. Grapes. Mangoes. Papayas. Pomegranates. Kiwi. Cherries. Vegetables Leafy greens, including lettuce, spinach, kale, chard, collard greens, mustard greens, and cabbage. Beets. Cauliflower. Broccoli. Carrots. Green beans. Tomatoes. Peppers. Onions. Cucumbers. Brussels sprouts. Grains Whole grains, such as whole-wheat or whole-grain bread, crackers, tortillas, cereal, and pasta. Unsweetened oatmeal. Quinoa. Brown or wild rice. Meats and other proteins Seafood. Poultry without skin. Lean cuts of poultry and  beef. Tofu. Nuts. Seeds. Dairy Low-fat or fat-free dairy products such as milk, yogurt, and cheese. The items listed above may not be a complete list of foods and beverages you can eat and drink. Contact a dietitian for more information. What foods should I avoid? Fruits Fruits canned with syrup. Vegetables Canned vegetables. Frozen vegetables with butter or cream sauce. Grains Refined white flour and flour products such as bread, pasta, snack foods, and cereals. Avoid all processed foods. Meats and other proteins Fatty cuts of meat. Poultry with skin. Breaded or fried meats. Processed meat. Avoid saturated fats. Dairy Full-fat yogurt, cheese, or milk. Beverages Sweetened drinks, such as soda or iced tea. The items listed above may not be a complete list of foods and beverages  you should avoid. Contact a dietitian for more information. Questions to ask a health care provider Do I need to meet with a certified diabetes care and education specialist? Do I need to meet with a dietitian? What number can I call if I have questions? When are the best times to check my blood glucose? Where to find more information: American Diabetes Association: diabetes.org Academy of Nutrition and Dietetics: eatright.[employer] of Diabetes and Digestive and Kidney Diseases: StageSync.si Association of Diabetes Care & Education Specialists: diabeteseducator.org Summary It is important to have healthy eating habits because your blood sugar (glucose) levels are greatly affected by what you eat and drink. It is important to use alcohol carefully. A healthy meal plan will help you manage your blood glucose and lower your risk of heart disease. Your health care provider may recommend that you work with a dietitian to make a meal plan that is best for you. This information is not intended to replace advice given to you by your health care provider. Make sure you discuss any questions you have with  your health care provider. Document Revised: 08/21/2019 Document Reviewed: 08/21/2019 Elsevier Patient Education  2024 Elsevier Inc.      Edwina Barth, MD [facility]

## 2022-07-06 NOTE — Assessment & Plan Note (Signed)
BP Readings from Last 3 Encounters:  07/06/22 128/80  01/05/22 136/88  10/06/21 138/78  Well-controlled hypertension Continue amlodipine 10 mg and losartan 50 mg daily Uncontrolled diabetes with hemoglobin A1c at 8.1 Still taking glipizide 10 mg in the morning. Just started Rybelsus 7 mg daily 10 days ago Cardiovascular risks associated with uncontrolled diabetes discussed Diet and nutrition discussed Blood work done today Follow-up in 3 months

## 2022-09-03 ENCOUNTER — Other Ambulatory Visit: Payer: Self-pay | Admitting: Emergency Medicine

## 2022-09-03 DIAGNOSIS — E1159 Type 2 diabetes mellitus with other circulatory complications: Secondary | ICD-10-CM

## 2022-09-15 ENCOUNTER — Encounter (INDEPENDENT_AMBULATORY_CARE_PROVIDER_SITE_OTHER): Payer: Self-pay

## 2022-10-04 ENCOUNTER — Ambulatory Visit: Payer: PPO | Admitting: Physician Assistant

## 2022-10-04 ENCOUNTER — Encounter: Payer: Self-pay | Admitting: Physician Assistant

## 2022-10-04 DIAGNOSIS — M65342 Trigger finger, left ring finger: Secondary | ICD-10-CM | POA: Diagnosis not present

## 2022-10-04 MED ORDER — METHYLPREDNISOLONE ACETATE 40 MG/ML IJ SUSP
20.0000 mg | INTRAMUSCULAR | Status: AC | PRN
Start: 2022-10-04 — End: 2022-10-04
  Administered 2022-10-04: 20 mg

## 2022-10-04 MED ORDER — LIDOCAINE HCL 1 % IJ SOLN
0.5000 mL | INTRAMUSCULAR | Status: AC | PRN
Start: 2022-10-04 — End: 2022-10-04
  Administered 2022-10-04: .5 mL

## 2022-10-04 NOTE — Progress Notes (Signed)
Office Visit Note   Patient: Erin Benitez           Date of Birth: 03-11-1950           MRN: [id number] Visit Date: 10/04/2022              Requested by: Georgina Quint, MD [address] [place],  [place] [address] PCP: Georgina Quint, MD  Chief Complaint  Patient presents with   Right Hand - Pain    Ring finger   Left Hand - Pain    Ring Finger      HPI: Patient is a pleasant 72 year old right-hand-dominant woman with a history of a right ring finger trigger finger.  She did get an injection by myself a few months ago and is had good relief.  She comes in today with a similar issue on her left ring finger.  Denies any injuries.  She is a diabetic most recent A1c is 8.1.  She is getting a new A1c this week  Assessment & Plan: Visit Diagnoses: Trigger finger left ring finger.  Plan: Had a discussion with her today.  Will go forward and inject.  She does need to work hard on getting her hemoglobin A1c down and she is hopeful that the 1 she is having done this week will improve.  Discussed that she may eventually need surgery but would not be able to have any steroid injections for 3 months.  Will have her follow-up in a couple weeks with Dr. Fara Boros, our hand specialist  Follow-Up Instructions: No follow-ups on file.   Ortho Exam  Patient is alert, oriented, no adenopathy, well-dressed, normal affect, normal respiratory effort. Examination of her left hand there is no redness no cellulitis she has brisk capillary refill less than 2 seconds of all fingers.  Pulses intact.  Sensation is intact.  She does have active painful triggering of the A1 pulley of the ring finger  Imaging: No results found. No images are attached to the encounter.  Labs: Lab Results  Component Value Date   HGBA1C 8.1 (A) 07/06/2022   HGBA1C 6.1 (A) 01/05/2022   HGBA1C 6.6 (A) 10/06/2021     Lab Results  Component Value Date   ALBUMIN 4.7 07/06/2022   ALBUMIN 4.6 01/05/2022    ALBUMIN 4.6 07/07/2021    No results found for: "MG" No results found for: "VD25OH"  No results found for: "PREALBUMIN"    Latest Ref Rng & Units 07/06/2022    8:37 AM 01/05/2022    8:35 AM 07/07/2021    9:03 AM  CBC EXTENDED  WBC 4.0 - 10.5 K/uL 4.5  4.4  3.9   RBC 3.87 - 5.11 Mil/uL 4.56  4.35  4.40   Hemoglobin 12.0 - 15.0 g/dL 19.1  47.8  29.5   HCT 36.0 - 46.0 % 39.3  38.1  37.0   Platelets 150.0 - 400.0 K/uL 250.0  258.0  284.0   NEUT# 1.4 - 7.7 K/uL 2.4  2.5  2.0   Lymph# 0.7 - 4.0 K/uL 1.5  1.4  1.4      There is no height or weight on file to calculate BMI.  Orders:  No orders of the defined types were placed in this encounter.  No orders of the defined types were placed in this encounter.    Procedures: Hand/UE Inj: L ring A1 for trigger finger on 10/04/2022 8:56 AM Indications: diagnostic and therapeutic Details: 25 G needle Medications: 0.5 mL lidocaine  1 %; 20 mg methylPREDNISolone acetate 40 MG/ML Outcome: tolerated well, no immediate complications Procedure, treatment alternatives, risks and benefits explained, specific risks discussed. Consent was given by the patient.    Clinical Data: No additional findings.  ROS:  All other systems negative, except as noted in the HPI. Review of Systems  Objective: Vital Signs: There were no vitals taken for this visit.  Specialty Comments:  No specialty comments available.  PMFS History: Patient Active Problem List   Diagnosis Date Noted   Trigger finger, right ring finger 03/16/2022   Insomnia 10/06/2021   NAFLD (nonalcoholic fatty liver disease) 16/11/9602   Protrusion of cervical intervertebral disc 10/21/2020   Spinal stenosis of cervical region 10/21/2020   Chronic left shoulder pain 07/07/2020   Dyslipidemia associated with type 2 diabetes mellitus (HCC) 09/06/2017   Diverticulosis of colon without hemorrhage 03/09/2015   Hyperlipidemia 03/09/2015   Gastroesophageal reflux disease without  esophagitis 03/09/2015   Hypertension associated with diabetes (HCC) 04/01/2013   Past Medical History:  Diagnosis Date   Allergy    Asthma    Bronchitis    Cataract    Diverticulosis    DM2 (diabetes mellitus, type 2) (HCC)    GERD (gastroesophageal reflux disease)    Heart murmur    Hyperplastic colon polyp    Hypertension     Family History  Problem Relation Age of Onset   Cancer Mother        ovarian cancer   Heart disease Mother    Liver disease Mother    Hyperlipidemia Mother    Stroke Mother    Cancer Father        lung cancer   Cancer Sister    Hyperlipidemia Sister    Cancer Sister    Hyperlipidemia Sister    Diabetes Maternal Aunt    Colon cancer Maternal Aunt        x2 aunts   Colon cancer Maternal Uncle        2 uncles   Colon cancer Paternal Grandfather    Esophageal cancer Neg Hx    Rectal cancer Neg Hx    Stomach cancer Neg Hx     Past Surgical History:  Procedure Laterality Date   CESAREAN SECTION     COLONOSCOPY     ESOPHAGOGASTRODUODENOSCOPY     TONSILLECTOMY     Social History   Occupational History   Occupation: Programmer, systems  Tobacco Use   Smoking status: Never   Smokeless tobacco: Never  Vaping Use   Vaping status: Never Used  Substance and Sexual Activity   Alcohol use: No   Drug use: No   Sexual activity: Yes

## 2022-10-06 ENCOUNTER — Encounter: Payer: Self-pay | Admitting: Emergency Medicine

## 2022-10-06 ENCOUNTER — Ambulatory Visit (INDEPENDENT_AMBULATORY_CARE_PROVIDER_SITE_OTHER): Payer: PPO | Admitting: Emergency Medicine

## 2022-10-06 VITALS — BP 142/84 | HR 65 | Temp 98.5°F | Ht 64.0 in | Wt 148.4 lb

## 2022-10-06 DIAGNOSIS — G4700 Insomnia, unspecified: Secondary | ICD-10-CM

## 2022-10-06 DIAGNOSIS — E1169 Type 2 diabetes mellitus with other specified complication: Secondary | ICD-10-CM | POA: Diagnosis not present

## 2022-10-06 DIAGNOSIS — I152 Hypertension secondary to endocrine disorders: Secondary | ICD-10-CM | POA: Diagnosis not present

## 2022-10-06 DIAGNOSIS — E785 Hyperlipidemia, unspecified: Secondary | ICD-10-CM

## 2022-10-06 DIAGNOSIS — E1159 Type 2 diabetes mellitus with other circulatory complications: Secondary | ICD-10-CM | POA: Diagnosis not present

## 2022-10-06 DIAGNOSIS — Z23 Encounter for immunization: Secondary | ICD-10-CM | POA: Diagnosis not present

## 2022-10-06 DIAGNOSIS — M65341 Trigger finger, right ring finger: Secondary | ICD-10-CM

## 2022-10-06 DIAGNOSIS — E782 Mixed hyperlipidemia: Secondary | ICD-10-CM

## 2022-10-06 DIAGNOSIS — Z7984 Long term (current) use of oral hypoglycemic drugs: Secondary | ICD-10-CM | POA: Diagnosis not present

## 2022-10-06 LAB — POCT GLYCOSYLATED HEMOGLOBIN (HGB A1C): Hemoglobin A1C: 9.1 % — AB (ref 4.0–5.6)

## 2022-10-06 MED ORDER — METOPROLOL SUCCINATE ER 25 MG PO TB24: 25.0000 mg | ORAL_TABLET | Freq: Every day | ORAL | 3 refills | Status: DC

## 2022-10-06 MED ORDER — ROSUVASTATIN CALCIUM 40 MG PO TABS: 40.0000 mg | ORAL_TABLET | Freq: Every day | ORAL | 3 refills | Status: DC

## 2022-10-06 MED ORDER — GVOKE HYPOPEN 2-PACK 0.5 MG/0.1ML ~~LOC~~ SOAJ
0.5000 mg | SUBCUTANEOUS | 3 refills | Status: AC | PRN
Start: 2022-10-06 — End: ?

## 2022-10-06 MED ORDER — ZOLPIDEM TARTRATE 5 MG PO TABS
5.0000 mg | ORAL_TABLET | Freq: Every evening | ORAL | 1 refills | Status: DC | PRN
Start: 2022-10-06 — End: 2023-01-05

## 2022-10-06 MED ORDER — SEMAGLUTIDE 7 MG PO TABS: 7.0000 mg | ORAL_TABLET | Freq: Every day | ORAL | 3 refills | Status: DC

## 2022-10-06 NOTE — Assessment & Plan Note (Signed)
Chronic and affecting quality of life Ambien 5 mg helping

## 2022-10-06 NOTE — Assessment & Plan Note (Signed)
Uncontrolled diabetes with hemoglobin A1c 9.1 Continue rosuvastatin 40 mg daily The 10-year ASCVD risk score (Arnett DK, et al., 2019) is: 24.5%   Values used to calculate the score:     Age: 72 years     Sex: Female     Is Non-Hispanic African American: Yes     Diabetic: Yes     Tobacco smoker: No     Systolic Blood Pressure: 150 mmHg     Is BP treated: Yes     HDL Cholesterol: 45.5 mg/dL     Total Cholesterol: 130 mg/dL Nutrition discussed

## 2022-10-06 NOTE — Patient Instructions (Signed)

## 2022-10-06 NOTE — Progress Notes (Signed)
Erin Benitez 72 y.o.   Chief Complaint  Patient presents with   Medical Management of Chronic Issues    F/u appt, DM , patient is having trouble sleeping.     HISTORY OF PRESENT ILLNESS: This is a 72 y.o. female here for 69-month follow-up of diabetes Occasional trouble sleeping No other complaints or medical concerns today.  HPI   Prior to Admission medications   Medication Sig Start Date End Date Taking? Authorizing Provider  amLODipine (NORVASC) 10 MG tablet Take 1 tablet (10 mg total) by mouth daily. 01/05/22  Yes Lysette Lindenbaum, Eilleen Kempf, MD  glipiZIDE (GLUCOTROL) 10 MG tablet TAKE 1 TABLET BY MOUTH ONCE DAILY BEFORE BREAKFAST 09/03/22  Yes Tarik Teixeira, Eilleen Kempf, MD  metoprolol succinate (TOPROL-XL) 25 MG 24 hr tablet Take 1 tablet (25 mg total) by mouth daily. 07/07/21  Yes Lequisha Cammack, Eilleen Kempf, MD  pantoprazole (PROTONIX) 40 MG tablet Take 1 tablet (40 mg total) by mouth daily. 01/05/22  Yes Sacoya Mcgourty, Eilleen Kempf, MD  rosuvastatin (CRESTOR) 40 MG tablet Take 1 tablet (40 mg total) by mouth daily. 07/07/21  Yes Elizandro Laura, Eilleen Kempf, MD  zolpidem (AMBIEN) 5 MG tablet Take 1 tablet (5 mg total) by mouth at bedtime as needed for sleep. 10/06/21  Yes Saxon Crosby, Eilleen Kempf, MD  Semaglutide 7 MG TABS Take 7 mg by mouth daily. Patient not taking: Reported on 10/06/2022 07/07/21   Georgina Quint, MD    Allergies  Allergen Reactions   Lisinopril Swelling   Peanut-Containing Drug Products Anaphylaxis, Hives, Swelling and Other (See Comments)    Pistachio's - swelling of the throat      Tramadol Nausea And Vomiting    Headache & dizziness with n&v    Patient Active Problem List   Diagnosis Date Noted   Trigger finger, right ring finger 03/16/2022   Insomnia 10/06/2021   NAFLD (nonalcoholic fatty liver disease) 45/40/9811   Protrusion of cervical intervertebral disc 10/21/2020   Spinal stenosis of cervical region 10/21/2020   Chronic left shoulder pain 07/07/2020   Dyslipidemia  associated with type 2 diabetes mellitus (HCC) 09/06/2017   Diverticulosis of colon without hemorrhage 03/09/2015   Hyperlipidemia 03/09/2015   Gastroesophageal reflux disease without esophagitis 03/09/2015   Hypertension associated with diabetes (HCC) 04/01/2013    Past Medical History:  Diagnosis Date   Allergy    Asthma    Bronchitis    Cataract    Diverticulosis    DM2 (diabetes mellitus, type 2) (HCC)    GERD (gastroesophageal reflux disease)    Heart murmur    Hyperplastic colon polyp    Hypertension     Past Surgical History:  Procedure Laterality Date   CESAREAN SECTION     COLONOSCOPY     ESOPHAGOGASTRODUODENOSCOPY     TONSILLECTOMY      Social History   Socioeconomic History   Marital status: Single    Spouse name: Not on file   Number of children: 1   Years of education: Not on file   Highest education level: Not on file  Occupational History   Occupation: Educator  Tobacco Use   Smoking status: Never   Smokeless tobacco: Never  Vaping Use   Vaping status: Never Used  Substance and Sexual Activity   Alcohol use: No   Drug use: No   Sexual activity: Yes  Other Topics Concern   Not on file  Social History Narrative   She reports she is single. She has 1 daughter. She is an  educator.  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     2 caffeinated beverages daily.   No alcohol tobacco or drug use.   Social Determinants of Health   Financial Resource Strain: Medium Risk (07/05/2022)   Overall Financial Resource Strain (CARDIA)    Difficulty of Paying Living Expenses: Somewhat hard  Food Insecurity: Food Insecurity Present (07/05/2022)   Hunger Vital Sign    Worried About Running Out of Food in the Last Year: Often true    Ran Out of Food in the Last Year: Patient declined  Transportation Needs: No Transportation  Needs (07/05/2022)   PRAPARE - Administrator, Civil Service (Medical): No    Lack of Transportation (Non-Medical): No  Physical Activity: Insufficiently Active (07/05/2022)   Exercise Vital Sign    Days of Exercise per Week: 2 days    Minutes of Exercise per Session: 30 min  Stress: Stress Concern Present (07/05/2022)   [employer] of Occupational Health - Occupational Stress Questionnaire    Feeling of Stress : To some extent  Social Connections: Unknown (07/05/2022)   Social Connection and Isolation Panel [NHANES]    Frequency of Communication with Friends and Family: More than three times a week    Frequency of Social Gatherings with Friends and Family: More than three times a week    Attends Religious Services: More than 4 times per year    Active Member of [employer] or Organizations: Not on file    Attends Banker Meetings: Not on file    Marital Status: Divorced  Intimate Partner Violence: Not At Risk (11/02/2020)   Humiliation, Afraid, Rape, and Kick questionnaire    Fear of Current or Ex-Partner: No    Emotionally Abused: No    Physically Abused: No    Sexually Abused: No    Family History  Problem Relation Age of Onset   Cancer Mother        ovarian cancer   Heart disease Mother    Liver disease Mother    Hyperlipidemia Mother    Stroke Mother    Cancer Father        lung cancer   Cancer Sister    Hyperlipidemia Sister    Cancer Sister    Hyperlipidemia Sister    Diabetes Maternal Aunt    Colon cancer Maternal Aunt        x2 aunts   Colon cancer Maternal Uncle        2 uncles   Colon cancer Paternal Grandfather    Esophageal cancer Neg Hx    Rectal cancer Neg Hx    Stomach cancer Neg Hx      Review of Systems  Constitutional: Negative.  Negative for chills and fever.  HENT: Negative.  Negative for congestion and sore throat.   Respiratory: Negative.  Negative for cough and shortness of breath.   Cardiovascular: Negative.  Negative  for chest pain and palpitations.  Gastrointestinal:  Negative for abdominal pain, diarrhea, nausea and vomiting.  Genitourinary: Negative.  Negative for dysuria and hematuria.  Skin: Negative.  Negative for rash.  Neurological: Negative.  Negative for dizziness and headaches.  All other systems reviewed and are negative.   Vitals:   10/06/22 0812  BP: (!) 150/86  Pulse: 65  Temp: 98.5 F (36.9 C)  SpO2: 97%    Physical  Exam Vitals reviewed.  Constitutional:      Appearance: Normal appearance.  HENT:     Head: Normocephalic.     Mouth/Throat:     Mouth: Mucous membranes are moist.     Pharynx: Oropharynx is clear.  Eyes:     Extraocular Movements: Extraocular movements intact.     Pupils: Pupils are equal, round, and reactive to light.  Cardiovascular:     Rate and Rhythm: Normal rate and regular rhythm.     Pulses: Normal pulses.     Heart sounds: Normal heart sounds.  Pulmonary:     Effort: Pulmonary effort is normal.     Breath sounds: Normal breath sounds.  Musculoskeletal:     Cervical back: No tenderness.  Lymphadenopathy:     Cervical: No cervical adenopathy.  Skin:    General: Skin is warm and dry.  Neurological:     Mental Status: She is alert and oriented to person, place, and time.  Psychiatric:        Mood and Affect: Mood normal.        Behavior: Behavior normal.    Results for orders placed or performed in visit on 10/06/22 (from the past 24 hour(s))  POCT HgB A1C     Status: Abnormal   Collection Time: 10/06/22  8:59 AM  Result Value Ref Range   Hemoglobin A1C 9.1 (A) 4.0 - 5.6 %   HbA1c POC (<> result, manual entry)     HbA1c, POC (prediabetic range)     HbA1c, POC (controlled diabetic range)       ASSESSMENT & PLAN: A total of 45 minutes was spent with the patient and counseling/coordination of care regarding preparing for this visit, review of most recent office visit notes, review of multiple chronic medical conditions and their  management, review of most recent blood work results including interpretation of today's hemoglobin A1c, review of all medications, cardiovascular risks associated with uncontrolled diabetes, education on nutrition, prognosis, documentation, need for follow-up.  Problem List Items Addressed This Visit       Cardiovascular and Mediastinum   Hypertension associated with diabetes (HCC)    Elevated blood pressure reading in the office but normal at home Continue amlodipine 10 mg daily and metoprolol succinate 25 mg daily Uncontrolled diabetes with hemoglobin A1c at 9.1. Was off medications for a while Recently started glipizide.  Of semaglutide for couple of months because she ran out of refills. Recommend to continue glipizide 10 mg daily and start Rybelsus 7 mg daily Cardiovascular risks associated with uncontrolled diabetes discussed Benefits of exercise discussed Diet and nutrition discussed Follow-up in 3 months      Relevant Medications   metoprolol succinate (TOPROL-XL) 25 MG 24 hr tablet   rosuvastatin (CRESTOR) 40 MG tablet   Semaglutide 7 MG TABS   Other Relevant Orders   POCT HgB A1C     Endocrine   Dyslipidemia associated with type 2 diabetes mellitus (HCC)    Uncontrolled diabetes with hemoglobin A1c 9.1 Continue rosuvastatin 40 mg daily The 10-year ASCVD risk score (Arnett DK, et al., 2019) is: 24.5%   Values used to calculate the score:     Age: 36 years     Sex: Female     Is Non-Hispanic African American: Yes     Diabetic: Yes     Tobacco smoker: No     Systolic Blood Pressure: 150 mmHg     Is BP treated: Yes     HDL Cholesterol: 45.5 mg/dL  Total Cholesterol: 130 mg/dL Nutrition discussed      Relevant Medications   rosuvastatin (CRESTOR) 40 MG tablet   Semaglutide 7 MG TABS     Musculoskeletal and Integument   Trigger finger, right ring finger    Sees hand surgeon on a regular basis Surgery planned for the near future        Other    Hyperlipidemia   Relevant Medications   metoprolol succinate (TOPROL-XL) 25 MG 24 hr tablet   rosuvastatin (CRESTOR) 40 MG tablet   Insomnia    Chronic and affecting quality of life Ambien 5 mg helping      Relevant Medications   zolpidem (AMBIEN) 5 MG tablet   Other Visit Diagnoses     Need for vaccination    -  Primary   Relevant Orders   Flu Vaccine Trivalent High Dose (Fluad)      Patient Instructions  Diabetes Mellitus and Nutrition, Adult When you have diabetes, or diabetes mellitus, it is very important to have healthy eating habits because your blood sugar (glucose) levels are greatly affected by what you eat and drink. Eating healthy foods in the right amounts, at about the same times every day, can help you: Manage your blood glucose. Lower your risk of heart disease. Improve your blood pressure. Reach or maintain a healthy weight. What can affect my meal plan? Every person with diabetes is different, and each person has different needs for a meal plan. Your health care provider may recommend that you work with a dietitian to make a meal plan that is best for you. Your meal plan may vary depending on factors such as: The calories you need. The medicines you take. Your weight. Your blood glucose, blood pressure, and cholesterol levels. Your activity level. Other health conditions you have, such as heart or kidney disease. How do carbohydrates affect me? Carbohydrates, also called carbs, affect your blood glucose level more than any other type of food. Eating carbs raises the amount of glucose in your blood. It is important to know how many carbs you can safely have in each meal. This is different for every person. Your dietitian can help you calculate how many carbs you should have at each meal and for each snack. How does alcohol affect me? Alcohol can cause a decrease in blood glucose (hypoglycemia), especially if you use insulin or take certain diabetes medicines by  mouth. Hypoglycemia can be a life-threatening condition. Symptoms of hypoglycemia, such as sleepiness, dizziness, and confusion, are similar to symptoms of having too much alcohol. Do not drink alcohol if: Your health care provider tells you not to drink. You are pregnant, may be pregnant, or are planning to become pregnant. If you drink alcohol: Limit how much you have to: 0-1 drink a day for women. 0-2 drinks a day for men. Know how much alcohol is in your drink. In the [place] one drink equals one 12 oz bottle of beer (355 mL), one 5 oz glass of wine (148 mL), or one 1 oz glass of hard liquor (44 mL). Keep yourself hydrated with water, diet soda, or unsweetened iced tea. Keep in mind that regular soda, juice, and other mixers may contain a lot of sugar and must be counted as carbs. What are tips for following this plan?  Reading food labels Start by checking the serving size on the Nutrition Facts label of packaged foods and drinks. The number of calories and the amount of carbs, fats, and other nutrients listed  on the label are based on one serving of the item. Many items contain more than one serving per package. Check the total grams (g) of carbs in one serving. Check the number of grams of saturated fats and trans fats in one serving. Choose foods that have a low amount or none of these fats. Check the number of milligrams (mg) of salt (sodium) in one serving. Most people should limit total sodium intake to less than 2,300 mg per day. Always check the nutrition information of foods labeled as "low-fat" or "nonfat." These foods may be higher in added sugar or refined carbs and should be avoided. Talk to your dietitian to identify your daily goals for nutrients listed on the label. Shopping Avoid buying canned, pre-made, or processed foods. These foods tend to be high in fat, sodium, and added sugar. Shop around the outside edge of the grocery store. This is where you will most often find  fresh fruits and vegetables, bulk grains, fresh meats, and fresh dairy products. Cooking Use low-heat cooking methods, such as baking, instead of high-heat cooking methods, such as deep frying. Cook using healthy oils, such as olive, canola, or sunflower oil. Avoid cooking with butter, cream, or high-fat meats. Meal planning Eat meals and snacks regularly, preferably at the same times every day. Avoid going long periods of time without eating. Eat foods that are high in fiber, such as fresh fruits, vegetables, beans, and whole grains. Eat 4-6 oz (112-168 g) of lean protein each day, such as lean meat, chicken, fish, eggs, or tofu. One ounce (oz) (28 g) of lean protein is equal to: 1 oz (28 g) of meat, chicken, or fish. 1 egg.  cup (62 g) of tofu. Eat some foods each day that contain healthy fats, such as avocado, nuts, seeds, and fish. What foods should I eat? Fruits Berries. Apples. Oranges. Peaches. Apricots. Plums. Grapes. Mangoes. Papayas. Pomegranates. Kiwi. Cherries. Vegetables Leafy greens, including lettuce, spinach, kale, chard, collard greens, mustard greens, and cabbage. Beets. Cauliflower. Broccoli. Carrots. Green beans. Tomatoes. Peppers. Onions. Cucumbers. Brussels sprouts. Grains Whole grains, such as whole-wheat or whole-grain bread, crackers, tortillas, cereal, and pasta. Unsweetened oatmeal. Quinoa. Brown or wild rice. Meats and other proteins Seafood. Poultry without skin. Lean cuts of poultry and beef. Tofu. Nuts. Seeds. Dairy Low-fat or fat-free dairy products such as milk, yogurt, and cheese. The items listed above may not be a complete list of foods and beverages you can eat and drink. Contact a dietitian for more information. What foods should I avoid? Fruits Fruits canned with syrup. Vegetables Canned vegetables. Frozen vegetables with butter or cream sauce. Grains Refined white flour and flour products such as bread, pasta, snack foods, and cereals. Avoid all  processed foods. Meats and other proteins Fatty cuts of meat. Poultry with skin. Breaded or fried meats. Processed meat. Avoid saturated fats. Dairy Full-fat yogurt, cheese, or milk. Beverages Sweetened drinks, such as soda or iced tea. The items listed above may not be a complete list of foods and beverages you should avoid. Contact a dietitian for more information. Questions to ask a health care provider Do I need to meet with a certified diabetes care and education specialist? Do I need to meet with a dietitian? What number can I call if I have questions? When are the best times to check my blood glucose? Where to find more information: American Diabetes Association: diabetes.org Academy of Nutrition and Dietetics: eatright.[employer] of Diabetes and Digestive and Kidney Diseases: StageSync.si  Association of Diabetes Care & Education Specialists: diabeteseducator.org Summary It is important to have healthy eating habits because your blood sugar (glucose) levels are greatly affected by what you eat and drink. It is important to use alcohol carefully. A healthy meal plan will help you manage your blood glucose and lower your risk of heart disease. Your health care provider may recommend that you work with a dietitian to make a meal plan that is best for you. This information is not intended to replace advice given to you by your health care provider. Make sure you discuss any questions you have with your health care provider. Document Revised: 08/21/2019 Document Reviewed: 08/21/2019 Elsevier Patient Education  2024 Elsevier Inc.     Edwina Barth, MD [facility]

## 2022-10-06 NOTE — Assessment & Plan Note (Signed)
Sees hand surgeon on a regular basis Surgery planned for the near future

## 2022-10-06 NOTE — Assessment & Plan Note (Signed)
Elevated blood pressure reading in the office but normal at home Continue amlodipine 10 mg daily and metoprolol succinate 25 mg daily Uncontrolled diabetes with hemoglobin A1c at 9.1. Was off medications for a while Recently started glipizide.  Of semaglutide for couple of months because she ran out of refills. Recommend to continue glipizide 10 mg daily and start Rybelsus 7 mg daily Cardiovascular risks associated with uncontrolled diabetes discussed Benefits of exercise discussed Diet and nutrition discussed Follow-up in 3 months

## 2022-11-04 ENCOUNTER — Ambulatory Visit: Payer: PPO | Admitting: Orthopedic Surgery

## 2022-11-04 DIAGNOSIS — M65341 Trigger finger, right ring finger: Secondary | ICD-10-CM

## 2022-11-04 DIAGNOSIS — E1169 Type 2 diabetes mellitus with other specified complication: Secondary | ICD-10-CM | POA: Diagnosis not present

## 2022-11-04 NOTE — Progress Notes (Signed)
Erin Benitez - 72 y.o. female MRN [id number]  Date of birth: Jun 13, 1950  Office Visit Note: Visit Date: 11/04/2022 PCP: Georgina Quint, MD Referred by: Georgina Quint, *  Subjective: No chief complaint on file.  HPI: Erin Benitez is a pleasant 71 y.o. female who presents today for evaluation of bilateral ring trigger digit.  She has undergone prior injections to bilateral ring trigger digits in the past, most recently right ring finger was injected in February of this year, left ring finger was injected in September of this year.  Both digits have remained refractory to conservative care, she has ongoing clicking and locking bilaterally, right greater than left.  She is diabetic, recent A1c was 9.1.  Pertinent ROS were reviewed with the patient and found to be negative unless otherwise specified above in HPI.   Visit Reason: right ring finger Duration of symptoms: 6+ months Hand dominance: right Occupation:Teacher Diabetic: Yes Smoking: No Heart/Lung History:heart murmur Blood Thinners: none  Prior Testing/EMG: 03/16/22 xray Injections (Date): 03/16/22  Treatments: injection Prior Surgery: none  Assessment & Plan: Visit Diagnoses:  1. Diabetes mellitus associated with hormonal etiology (HCC)     Plan: Extensive discussion was had the patient today regarding her bilateral ring finger trigger digit.  I reviewed the pathophysiology of stenosing tenosynovitis with her in detail today.  Given that her symptoms are refractory to conservative care bilaterally, she is indicated for bilateral staged ring finger trigger digit release.  I would like to obtain a more up-to-date A1c level, with the goal of below 8.5 prior to surgical intervention in order to minimize perioperative risks.  Risks and benefits of the procedure were discussed, risks including but not limited to infection, bleeding, scarring, stiffness, nerve injury, tendon injury, vascular injury, hardware  complication if utilized, recurrence of symptoms and need for subsequent operation.  Patient expressed understanding.  I did also explain to her that given the severity of her PIP contracture, there may be some therapy required postoperatively to try to help regain full mobility.  I did explain that the contracture can often persist despite A1 pulley release.  A1c blood work was obtained today.  We will tentatively schedule for right ring finger trigger digit release to be performed under local anesthesia at the next available date if her A1c level is appropriate.  She expressed full understanding.    Follow-up: No follow-ups on file.   Meds & Orders: No orders of the defined types were placed in this encounter.   Orders Placed This Encounter  Procedures   HgB A1c     Procedures: No procedures performed      Clinical History: No specialty comments available.  She reports that she has never smoked. She has never used smokeless tobacco.  Recent Labs    01/05/22 0821 07/06/22 0835 10/06/22 0859  HGBA1C 6.1* 8.1* 9.1*    Objective:   Vital Signs: There were no vitals taken for this visit.  Physical Exam  Gen: Well-appearing, in no acute distress; non-toxic CV: Regular Rate. Well-perfused. Warm.  Resp: Breathing unlabored on room air; no wheezing. Psych: Fluid speech in conversation; appropriate affect; normal thought process  Ortho Exam Right hand: - Palpable nodule over the ring finger A1 pulley with significant tenderness, notable clicking and locking with deep flexion, requires manual correction - Ring finger PIP with flexion contracture approximately 15 degrees, slightly passively correctable  Left hand: - Palpable nodule over the ring finger A1 pulley, associated tenderness, notable  clicking with deep flexion  Imaging: No results found.  Past Medical/Family/Surgical/Social History: Medications & Allergies reviewed per EMR, new medications updated. Patient Active  Problem List   Diagnosis Date Noted   Trigger finger, right ring finger 03/16/2022   Insomnia 10/06/2021   NAFLD (nonalcoholic fatty liver disease) 65/78/4696   Protrusion of cervical intervertebral disc 10/21/2020   Spinal stenosis of cervical region 10/21/2020   Chronic left shoulder pain 07/07/2020   Dyslipidemia associated with type 2 diabetes mellitus (HCC) 09/06/2017   Diverticulosis of colon without hemorrhage 03/09/2015   Hyperlipidemia 03/09/2015   Gastroesophageal reflux disease without esophagitis 03/09/2015   Hypertension associated with diabetes (HCC) 04/01/2013   Past Medical History:  Diagnosis Date   Allergy    Asthma    Bronchitis    Cataract    Diverticulosis    DM2 (diabetes mellitus, type 2) (HCC)    GERD (gastroesophageal reflux disease)    Heart murmur    Hyperplastic colon polyp    Hypertension    Family History  Problem Relation Age of Onset   Cancer Mother        ovarian cancer   Heart disease Mother    Liver disease Mother    Hyperlipidemia Mother    Stroke Mother    Cancer Father        lung cancer   Cancer Sister    Hyperlipidemia Sister    Cancer Sister    Hyperlipidemia Sister    Diabetes Maternal Aunt    Colon cancer Maternal Aunt        x2 aunts   Colon cancer Maternal Uncle        2 uncles   Colon cancer Paternal Grandfather    Esophageal cancer Neg Hx    Rectal cancer Neg Hx    Stomach cancer Neg Hx    Past Surgical History:  Procedure Laterality Date   CESAREAN SECTION     COLONOSCOPY     ESOPHAGOGASTRODUODENOSCOPY     TONSILLECTOMY     Social History   Occupational History   Occupation: Programmer, systems  Tobacco Use   Smoking status: Never   Smokeless tobacco: Never  Vaping Use   Vaping status: Never Used  Substance and Sexual Activity   Alcohol use: No   Drug use: No   Sexual activity: Yes    Erin Benitez, M.D. [facility] 9:19 AM

## 2022-11-05 LAB — HEMOGLOBIN A1C
Hgb A1c MFr Bld: 8.8 %{Hb} — ABNORMAL HIGH (ref <5.7)
Mean Plasma Glucose: 206 mg/dL
eAG (mmol/L): 11.4 mmol/L

## 2022-11-10 ENCOUNTER — Other Ambulatory Visit: Payer: Self-pay | Admitting: Orthopedic Surgery

## 2022-11-10 DIAGNOSIS — M65341 Trigger finger, right ring finger: Secondary | ICD-10-CM

## 2022-11-21 ENCOUNTER — Other Ambulatory Visit: Payer: Self-pay | Admitting: Orthopedic Surgery

## 2022-11-21 DIAGNOSIS — M65341 Trigger finger, right ring finger: Secondary | ICD-10-CM

## 2022-11-24 DIAGNOSIS — M65341 Trigger finger, right ring finger: Secondary | ICD-10-CM | POA: Diagnosis not present

## 2022-11-30 ENCOUNTER — Other Ambulatory Visit: Payer: Self-pay | Admitting: Physician Assistant

## 2022-11-30 ENCOUNTER — Encounter: Payer: Self-pay | Admitting: Orthopedic Surgery

## 2022-11-30 MED ORDER — HYDROCODONE-ACETAMINOPHEN 5-325 MG PO TABS: 1.0000 | ORAL_TABLET | Freq: Three times a day (TID) | ORAL | 0 refills | Status: DC | PRN

## 2022-11-30 NOTE — Telephone Encounter (Signed)
It looks like she had a trigger finger release so I sent in norco as this should be strong enough for this surgery.  Sent meds to pharmacy on file

## 2022-11-30 NOTE — Telephone Encounter (Signed)
She has nausea/vomiting, headaches and dizziness with tramadol listed in her chart.  She's ok with taking this?

## 2022-12-03 ENCOUNTER — Other Ambulatory Visit: Payer: Self-pay | Admitting: Emergency Medicine

## 2022-12-03 DIAGNOSIS — E1159 Type 2 diabetes mellitus with other circulatory complications: Secondary | ICD-10-CM

## 2022-12-09 ENCOUNTER — Ambulatory Visit (INDEPENDENT_AMBULATORY_CARE_PROVIDER_SITE_OTHER): Payer: PPO | Admitting: Orthopedic Surgery

## 2022-12-09 DIAGNOSIS — M65341 Trigger finger, right ring finger: Secondary | ICD-10-CM | POA: Diagnosis not present

## 2022-12-09 NOTE — Progress Notes (Signed)
   Erin Benitez - 72 y.o. female MRN [id number]  Date of birth: 1950/04/06  Office Visit Note: Visit Date: 12/09/2022 PCP: Georgina Quint, MD Referred by: Georgina Quint, *  Subjective:  HPI: Erin Benitez is a 72 y.o. female who presents today for follow up 2 weeks status post right ring trigger digit release.  She is doing well overall, pain is controlled.  No residual clicking or locking.  Pertinent ROS were reviewed with the patient and found to be negative unless otherwise specified above in HPI.   Assessment & Plan: Visit Diagnoses: No diagnosis found.  Plan: Sutures removed today.  She will begin occupational therapy on Monday for range of motion exercises with progression to strengthening as tolerated.  Follow-up in approximately 4 to 6 weeks for recheck.  At that juncture, we can begin discuss treatment for the left ring finger as well.  She states that the prior injection for the left ring finger trigger is still helping.  Follow-up: No follow-ups on file.   Meds & Orders: No orders of the defined types were placed in this encounter.  No orders of the defined types were placed in this encounter.    Procedures: No procedures performed       Objective:   Vital Signs: There were no vitals taken for this visit.  Ortho Exam  Right hand: - Well-healing incision at the base of the ring finger, skin is well-approximated, no erythema or drainage - Able to perform full digital range of motion without residual clicking or locking - Sensation intact distally, hand is warm well-perfused   Imaging: No results found.   Jayvyn Haselton [name], M.D. [facility] 8:15 AM

## 2022-12-09 NOTE — Therapy (Signed)
OUTPATIENT OCCUPATIONAL THERAPY ORTHO EVALUATION, TREATMENT, DISCHARGE  Patient Name: Erin Benitez MRN: [id number] DOB:27-Aug-1950, 72 y.o., female Today's Date: 12/12/2022  PCP: Edwina Barth MD REFERRING PROVIDER:  Samuella Cota, MD       CLINICAL IMPRESSION: Patient is a 72 y.o. female who was seen today for occupational therapy evaluation for swelling, pain, weakness and decreased functional ability following Rt RF TFR procedure. The patient is appropriate for OT rehab services and benefited from treatment today. The patient got copious education/treatment today for self-care, wound management, exercises and how to transition to normal activities in the next 4-6 weeks. The patient agrees that they can manage these recommendations independently, and should not need to return for follow up visits. The patient should follow up with the surgeon with any concerns, and could possibly return to therapy, if needed, with a new order.  The patient will discharge therapy treatment after this visit.         END OF SESSION:  OT End of Session - 12/12/22 0857     Visit Number 1    Authorization Type [employer]    OT Start Time 0857    OT Stop Time 0931    OT Time Calculation (min) 34 min    Activity Tolerance Patient tolerated treatment well;No increased pain;Patient limited by fatigue;Patient limited by pain    Behavior During Therapy [facility] for tasks assessed/performed             Past Medical History:  Diagnosis Date   Allergy    Asthma    Bronchitis    Cataract    Diverticulosis    DM2 (diabetes mellitus, type 2) (HCC)    GERD (gastroesophageal reflux disease)    Heart murmur    Hyperplastic colon polyp    Hypertension    Past Surgical History:  Procedure Laterality Date   CESAREAN SECTION     COLONOSCOPY     ESOPHAGOGASTRODUODENOSCOPY     TONSILLECTOMY     Patient Active Problem List   Diagnosis Date Noted   Trigger finger, right ring finger  03/16/2022   Insomnia 10/06/2021   NAFLD (nonalcoholic fatty liver disease) 56/21/3086   Protrusion of cervical intervertebral disc 10/21/2020   Spinal stenosis of cervical region 10/21/2020   Chronic left shoulder pain 07/07/2020   Dyslipidemia associated with type 2 diabetes mellitus (HCC) 09/06/2017   Diverticulosis of colon without hemorrhage 03/09/2015   Hyperlipidemia 03/09/2015   Gastroesophageal reflux disease without esophagitis 03/09/2015   Hypertension associated with diabetes (HCC) 04/01/2013    ONSET DATE: 11/24/22 DOS  REFERRING DIAG: M65.341 (ICD-10-CM) - Trigger ring finger of right hand   THERAPY DIAG:  Pain in right hand  Stiffness of right hand, not elsewhere classified  Rationale for Evaluation and Treatment: Rehabilitation  SUBJECTIVE:   SUBJECTIVE STATEMENT: 2+ weeks s/p Rt hand TFR. She states also having Lt hand RF triggering finger and plans on possible future sx (OT will address).     PRECAUTIONS: None  RED FLAGS: None   WEIGHT BEARING RESTRICTIONS: Yes <10# for next month recommended   PAIN:  Are you having pain? Yes: NPRS scale: 5/10 Pain location: Rt RF sx area  Pain description: aching Aggravating factors: motion Relieving factors: rest  FALLS: Has patient fallen in last 6 months? No   PLOF: Independent  PATIENT GOALS: to improve use and motion with tender Right dom hand     OBJECTIVE: (All objective assessments below are from initial evaluation on: 12/12/22 unless otherwise specified.)  HAND DOMINANCE: Right   ADLs: Overall ADLs: States decreased ability to grab, hold household objects, pain and difficulty to open containers, perform FMS tasks (manipulate fasteners on clothing).     UPPER EXTREMITY ROM      Hand AROM Right eval  Full Fist Ability (or Gap to Distal Palmar Crease) 3cm gap from RF tip to [facility]  Thumb Opposition  (Kapandji Scale)  6/10  Ring MCP (0-90) 0 - 66  Ring PIP (0-100) 0-  82  Ring DIP (0-70)  (-15) -53   (Blank rows = not tested)   UPPER EXTREMITY MMT:    Eval:  NT at eval due to recent and still healing injuries. Expected to improve with HEP and recommendations.   HAND FUNCTION: Eval: Observed weakness in affected Rt hand/arm, grossly 3-/5 MMT, but specific gripping and resistance training contraindicated today. Expected to improve with HEP and recommendations.    COORDINATION: Eval: Mild observed coordination impairments with affected Rt hand/arm. Expected to improve with HEP and recommendations.    SENSATION: Eval:  Light touch intact today  EDEMA:   Eval:  Mildly swollen in Rt hand today.  Expected to improve with HEP and recommendations.   COGNITION: Eval: Overall cognitive status: WFL for evaluation today   OBSERVATIONS:   Eval: Surgical site is clean and no overt signs of infection, no drainage, signs of dehiscence, etc.  Tenderness and swelling is within normal limits for post-op timeframe.     TODAY'S TREATMENT:  Post-evaluation treatment:   The patient was given safety information for managing post-op wound, including not to soak wound, to keep clean and dry, to start with scar mobilizations approx 3 days after stitches are removed and if the wound is closed. They should replace the dressing daily, and monitor for signs of infection.   A silicone scar pad was provided to help the scar heal smoothly- to be worn at night once the wound is healed.  They were also supplied with compressive gauze to help with swelling as needed.  They should contact the surgeon with any concerns immediately.   The patient should also avoid any strong gripping, push, pull, weight bearing or repetitive motion for the next month.  They should not be doing painful activities.   After a month, they can progressively return to all light, normal activities. Sports and heavy weight lifting  should be withheld for a total of 3 months.   The patient was also educated (explanation and  demonstration) on the following home exercise program including tolerable range of motion, gentle passive range of motion, scar care, progressive desensitization, prevention of soft tissue contractures, etc. The patient states understanding all directions and feels comfortable with doing this at home, self-management, and following up with the surgeon as needed/scheduled.     Exercises - Wrist Prayer Stretch  - 3-4 x daily - 3 reps - 15 seconds hold - Tendon Glides  - 3-4 x daily - 5 reps - 3 second hold - Full finger stretches   - 3-4 x daily - 3 reps - 15 second hold - Push your knuckles down, pull back hand to feel a stretch  - 3-4 x daily - 3 reps - 15 second hold  Patient Education - Scar Massage    PATIENT EDUCATION: Education details: See tx section above for details  Person educated: Patient Education method: Verbal Instruction, Teach back, Handouts  Education comprehension: States and demonstrates understanding   HOME EXERCISE PROGRAM: Access Code: [email or web address] URL: [email or web address] Date:  12/12/2022 Prepared by: Fannie Knee   GOALS: Goals reviewed with patient? Yes   SHORT TERM GOALS: (STG required if POC>30 days) Target Date: 12/12/22  1.  Pt will demo/state understanding of initial HEP and therapist recommendations to improve pain levels, improve motions and ability and eventually return to normal activities.   Goal status: MET    ASSESSMENT:  CLINICAL IMPRESSION: Patient is a 72 y.o. female who was seen today for occupational therapy evaluation for swelling, pain, weakness and decreased functional ability following Rt RF TFR procedure. The patient is appropriate for OT rehab services and benefited from treatment today. The patient got copious education/treatment today for self-care, wound management, exercises and how to transition to normal activities in the next 4-6 weeks. The patient agrees that they can manage these recommendations  independently, and should not need to return for follow up visits. The patient should follow up with the surgeon with any concerns, and could possibly return to therapy, if needed, with a new order.  The patient will discharge therapy treatment after this visit.     PERFORMANCE DEFICITS: in functional skills including ADLs, IADLs, coordination, dexterity, sensation, edema, ROM, strength, pain, fascial restrictions, flexibility, Fine motor control, body mechanics, endurance, decreased knowledge of precautions, wound, and UE functional use, cognitive skills including problem solving and safety awareness, and psychosocial skills including coping strategies, environmental adaptation, and habits.   IMPAIRMENTS: are limiting patient from ADLs, IADLs, rest and sleep, leisure, and social participation.   COMORBIDITIES: may have co-morbidities  that affects occupational performance. Patient will benefit from skilled OT to address above impairments and improve overall function.  MODIFICATION OR ASSISTANCE TO COMPLETE EVALUATION: No modification of tasks or assist necessary to complete an evaluation.  OT OCCUPATIONAL PROFILE AND HISTORY: Problem focused assessment: Including review of records relating to presenting problem.  CLINICAL DECISION MAKING: LOW - limited treatment options, no task modification necessary  REHAB POTENTIAL: Excellent  EVALUATION COMPLEXITY: Low      PLAN:  OT FREQUENCY: one time visit  OT DURATION: 1 sessions  PLANNED INTERVENTIONS: self care/ADL training, therapeutic exercise, therapeutic activity, neuromuscular re-education, manual therapy, scar mobilization, passive range of motion, splinting, ultrasound, fluidotherapy, compression bandaging, moist heat, cryotherapy, contrast bath, patient/family education, energy conservation, coping strategies training, and Re-evaluation  RECOMMENDED OTHER SERVICES: none now   CONSULTED AND AGREED WITH PLAN OF CARE: Patient  PLAN  FOR NEXT SESSION:   N/A    Fannie Knee, OTR/L, CHT 12/12/2022, 9:35 AM

## 2022-12-12 ENCOUNTER — Other Ambulatory Visit: Payer: Self-pay

## 2022-12-12 ENCOUNTER — Encounter: Payer: Self-pay | Admitting: Rehabilitative and Restorative Service Providers"

## 2022-12-12 ENCOUNTER — Ambulatory Visit (INDEPENDENT_AMBULATORY_CARE_PROVIDER_SITE_OTHER): Payer: PPO | Admitting: Rehabilitative and Restorative Service Providers"

## 2022-12-12 DIAGNOSIS — M79641 Pain in right hand: Secondary | ICD-10-CM | POA: Diagnosis not present

## 2022-12-12 DIAGNOSIS — M25641 Stiffness of right hand, not elsewhere classified: Secondary | ICD-10-CM

## 2022-12-23 ENCOUNTER — Encounter: Payer: PPO | Admitting: Rehabilitative and Restorative Service Providers"

## 2023-01-05 ENCOUNTER — Ambulatory Visit: Payer: PPO | Admitting: Emergency Medicine

## 2023-01-05 ENCOUNTER — Encounter: Payer: Self-pay | Admitting: Emergency Medicine

## 2023-01-05 VITALS — BP 142/88 | HR 90 | Temp 98.8°F | Ht 64.0 in | Wt 150.0 lb

## 2023-01-05 DIAGNOSIS — E785 Hyperlipidemia, unspecified: Secondary | ICD-10-CM

## 2023-01-05 DIAGNOSIS — G4700 Insomnia, unspecified: Secondary | ICD-10-CM

## 2023-01-05 DIAGNOSIS — I152 Hypertension secondary to endocrine disorders: Secondary | ICD-10-CM | POA: Diagnosis not present

## 2023-01-05 DIAGNOSIS — Z7984 Long term (current) use of oral hypoglycemic drugs: Secondary | ICD-10-CM

## 2023-01-05 DIAGNOSIS — E1159 Type 2 diabetes mellitus with other circulatory complications: Secondary | ICD-10-CM | POA: Diagnosis not present

## 2023-01-05 DIAGNOSIS — E1169 Type 2 diabetes mellitus with other specified complication: Secondary | ICD-10-CM

## 2023-01-05 LAB — POCT GLYCOSYLATED HEMOGLOBIN (HGB A1C): Hemoglobin A1C: 7.3 % — AB (ref 4.0–5.6)

## 2023-01-05 MED ORDER — ZOLPIDEM TARTRATE 10 MG PO TABS
10.0000 mg | ORAL_TABLET | Freq: Every evening | ORAL | 1 refills | Status: DC | PRN
Start: 2023-01-05 — End: 2023-06-12

## 2023-01-05 NOTE — Assessment & Plan Note (Signed)
Ambien helping some. Okay to increase dose to 10 mg at bedtime as needed.

## 2023-01-05 NOTE — Patient Instructions (Signed)

## 2023-01-05 NOTE — Assessment & Plan Note (Signed)
Well-controlled diabetes with hemoglobin A1c of 7.3 Continue glipizide and Rybelsus Continue rosuvastatin 40 mg daily Diet and nutrition discussed The 10-year ASCVD risk score (Arnett DK, et al., 2019) is: 23.1%   Values used to calculate the score:     Age: 72 years     Sex: Female     Is Non-Hispanic African American: Yes     Diabetic: Yes     Tobacco smoker: No     Systolic Blood Pressure: 142 mmHg     Is BP treated: Yes     HDL Cholesterol: 45.5 mg/dL     Total Cholesterol: 130 mg/dL

## 2023-01-05 NOTE — Assessment & Plan Note (Signed)
BP Readings from Last 3 Encounters:  01/05/23 (!) 142/88  10/06/22 (!) 142/84  07/06/22 128/80  Elevated blood pressure in the office.  Normal blood pressure readings at home Continue amlodipine 10 mg daily, metoprolol succinate 25 mg daily Well-controlled diabetes with hemoglobin A1c of 7.3 Cardiovascular risks associated with hypertension and diabetes discussed Continue Rybelsus 7 mg daily along with glipizide 10 mg daily Diet and nutrition discussed Follow-up in 3 months

## 2023-01-05 NOTE — Progress Notes (Signed)
Erin Benitez 72 y.o.   Chief Complaint  Patient presents with   Follow-up    3 month f/u for DM. Pt says she still having trouble sleeping. Wants refill on hydro if possible     HISTORY OF PRESENT ILLNESS: This is a 72 y.o. female here for 38-month follow-up of diabetes Since our last visit was able to follow-up with orthopedist for trigger fingers.  Had successful procedure done. Still struggling a bit with insomnia. No other complaints or medical concerns today. Lab Results  Component Value Date   HGBA1C 8.8 (H) 11/04/2022   Wt Readings from Last 3 Encounters:  01/05/23 150 lb (68 kg)  10/06/22 148 lb 6 oz (67.3 kg)  07/06/22 155 lb 2 oz (70.4 kg)     HPI   Prior to Admission medications   Medication Sig Start Date End Date Taking? Authorizing Provider  amLODipine (NORVASC) 10 MG tablet Take 1 tablet (10 mg total) by mouth daily. 01/05/22  Yes Natlie Asfour, Eilleen Kempf, MD  glipiZIDE (GLUCOTROL) 10 MG tablet TAKE 1 TABLET BY MOUTH ONCE DAILY BEFORE BREAKFAST 12/03/22  Yes Aviraj Kentner, Eilleen Kempf, MD  Glucagon (GVOKE HYPOPEN 2-PACK) 0.5 MG/0.1ML SOAJ Inject 0.5 mg into the skin as needed. 10/06/22  Yes Baby Stairs, Eilleen Kempf, MD  HYDROcodone-acetaminophen (NORCO) 5-325 MG tablet Take 1 tablet by mouth 3 (three) times daily as needed. 11/30/22  Yes Cristie Hem, PA-C  metoprolol succinate (TOPROL-XL) 25 MG 24 hr tablet Take 1 tablet (25 mg total) by mouth daily. 10/06/22  Yes Merina Behrendt, Eilleen Kempf, MD  pantoprazole (PROTONIX) 40 MG tablet Take 1 tablet (40 mg total) by mouth daily. 01/05/22  Yes Florentine Diekman, Eilleen Kempf, MD  rosuvastatin (CRESTOR) 40 MG tablet Take 1 tablet (40 mg total) by mouth daily. 10/06/22  Yes Tristyn Demarest, Eilleen Kempf, MD  Semaglutide 7 MG TABS Take 1 tablet (7 mg total) by mouth daily. 10/06/22  Yes Tadashi Burkel, Eilleen Kempf, MD  zolpidem (AMBIEN) 5 MG tablet Take 1 tablet (5 mg total) by mouth at bedtime as needed for sleep. 10/06/22  Yes Georgina Quint, MD     Allergies  Allergen Reactions   Lisinopril Swelling   Peanut-Containing Drug Products Anaphylaxis, Hives, Swelling and Other (See Comments)    Pistachio's - swelling of the throat      Tramadol Nausea And Vomiting    Headache & dizziness with n&v    Patient Active Problem List   Diagnosis Date Noted   Trigger finger, right ring finger 03/16/2022   Insomnia 10/06/2021   NAFLD (nonalcoholic fatty liver disease) 09/81/1914   Protrusion of cervical intervertebral disc 10/21/2020   Spinal stenosis of cervical region 10/21/2020   Chronic left shoulder pain 07/07/2020   Dyslipidemia associated with type 2 diabetes mellitus (HCC) 09/06/2017   Diverticulosis of colon without hemorrhage 03/09/2015   Hyperlipidemia 03/09/2015   Gastroesophageal reflux disease without esophagitis 03/09/2015   Hypertension associated with diabetes (HCC) 04/01/2013    Past Medical History:  Diagnosis Date   Allergy    Asthma    Bronchitis    Cataract    Diverticulosis    DM2 (diabetes mellitus, type 2) (HCC)    GERD (gastroesophageal reflux disease)    Heart murmur    Hyperplastic colon polyp    Hypertension     Past Surgical History:  Procedure Laterality Date   CESAREAN SECTION     COLONOSCOPY     ESOPHAGOGASTRODUODENOSCOPY     TONSILLECTOMY      Social History  Socioeconomic History   Marital status: Single    Spouse name: Not on file   Number of children: 1   Years of education: Not on file   Highest education level: Associate degree: occupational, Scientist, product/process development, or vocational program  Occupational History   Occupation: Educator  Tobacco Use   Smoking status: Never   Smokeless tobacco: Never  Vaping Use   Vaping status: Never Used  Substance and Sexual Activity   Alcohol use: No   Drug use: No   Sexual activity: Yes  Other Topics Concern   Not on file  Social History Narrative   She reports she is single. She has 1 daughter. She is an Programmer, systems.  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     2 caffeinated beverages daily.   No alcohol tobacco or drug use.   Social Determinants of Health   Financial Resource Strain: Medium Risk (01/04/2023)   Overall Financial Resource Strain (CARDIA)    Difficulty of Paying Living Expenses: Somewhat hard  Food Insecurity: Patient Declined (01/04/2023)   Hunger Vital Sign    Worried About Running Out of Food in the Last Year: Patient declined    Ran Out of Food in the Last Year: Patient declined  Transportation Needs: Patient Declined (01/04/2023)   PRAPARE - Administrator, Civil Service (Medical): Patient declined    Lack of Transportation (Non-Medical): Patient declined  Physical Activity: Insufficiently Active (01/04/2023)   Exercise Vital Sign    Days of Exercise per Week: 3 days    Minutes of Exercise per Session: 40 min  Stress: Stress Concern Present (01/04/2023)   [employer] of Occupational Health - Occupational Stress Questionnaire    Feeling of Stress : Rather much  Social Connections: Unknown (01/04/2023)   Social Connection and Isolation Panel [NHANES]    Frequency of Communication with Friends and Family: Three times a week    Frequency of Social Gatherings with Friends and Family: Patient declined    Attends Religious Services: More than 4 times per year    Active Member of [employer] or Organizations: Patient declined    Attends Banker Meetings: Not on file    Marital Status: Patient declined  Intimate Partner Violence: Not At Risk (11/02/2020)   Humiliation, Afraid, Rape, and Kick questionnaire    Fear of Current or Ex-Partner: No    Emotionally Abused: No    Physically Abused: No    Sexually Abused: No    Family History  Problem Relation Age of Onset   Cancer Mother        ovarian cancer   Heart disease Mother    Liver disease  Mother    Hyperlipidemia Mother    Stroke Mother    Cancer Father        lung cancer   Cancer Sister    Hyperlipidemia Sister    Cancer Sister    Hyperlipidemia Sister    Diabetes Maternal Aunt    Colon cancer Maternal Aunt        x2 aunts   Colon cancer Maternal Uncle        2 uncles   Colon cancer Paternal Grandfather    Esophageal cancer Neg Hx    Rectal cancer Neg Hx    Stomach cancer Neg Hx      Review of Systems  Constitutional: Negative.  Negative for chills and fever.  HENT: Negative.  Negative for congestion and sore throat.   Respiratory: Negative.  Negative for cough and shortness of breath.   Cardiovascular: Negative.  Negative for chest pain and palpitations.  Gastrointestinal:  Negative for abdominal pain, diarrhea, nausea and vomiting.  Genitourinary: Negative.  Negative for dysuria and hematuria.  Skin: Negative.  Negative for rash.  Neurological:  Negative for dizziness and headaches.  Psychiatric/Behavioral:  The patient has insomnia.   All other systems reviewed and are negative.   Vitals:   01/05/23 0812  BP: (!) 142/88  Pulse: 90  Temp: 98.8 F (37.1 C)  SpO2: 96%    Physical Exam Vitals reviewed.  Constitutional:      Appearance: Normal appearance.  HENT:     Head: Normocephalic.     Mouth/Throat:     Mouth: Mucous membranes are moist.     Pharynx: Oropharynx is clear.  Eyes:     Extraocular Movements: Extraocular movements intact.     Conjunctiva/sclera: Conjunctivae normal.     Pupils: Pupils are equal, round, and reactive to light.  Cardiovascular:     Rate and Rhythm: Normal rate and regular rhythm.     Pulses: Normal pulses.     Heart sounds: Normal heart sounds.  Pulmonary:     Effort: Pulmonary effort is normal.     Breath sounds: Normal breath sounds.  Musculoskeletal:     Cervical back: No tenderness.  Lymphadenopathy:     Cervical: No cervical adenopathy.  Skin:    General: Skin is warm and dry.     Capillary Refill:  Capillary refill takes less than 2 seconds.  Neurological:     General: No focal deficit present.     Mental Status: She is alert and oriented to person, place, and time.  Psychiatric:        Mood and Affect: Mood normal.        Behavior: Behavior normal.    Results for orders placed or performed in visit on 01/05/23 (from the past 24 hour(s))  POCT HgB A1C     Status: Abnormal   Collection Time: 01/05/23  8:48 AM  Result Value Ref Range   Hemoglobin A1C 7.3 (A) 4.0 - 5.6 %   HbA1c POC (<> result, manual entry)     HbA1c, POC (prediabetic range)     HbA1c, POC (controlled diabetic range)       ASSESSMENT & PLAN: A total of 45 minutes was spent with the patient and counseling/coordination of care regarding preparing for this visit, review of most recent office visit notes, review of multiple chronic medical conditions and their management, cardiovascular risks associated with hypertension and diabetes, review of all medications, review of most recent bloodwork results including interpretation of today's hemoglobin A1c, review of health maintenance items, education on nutrition, prognosis, documentation, and need for follow up.   Problem List Items Addressed This Visit       Cardiovascular and Mediastinum   Hypertension associated with diabetes (HCC) - Primary    BP Readings from Last 3 Encounters:  01/05/23 (!) 142/88  10/06/22 (!) 142/84  07/06/22 128/80  Elevated blood pressure in the office.  Normal blood pressure readings at home Continue amlodipine 10 mg daily, metoprolol succinate 25 mg daily Well-controlled diabetes with hemoglobin A1c of 7.3 Cardiovascular risks associated with hypertension and diabetes discussed Continue Rybelsus 7 mg daily along with glipizide 10 mg daily Diet and nutrition discussed Follow-up in 3 months  Endocrine   Dyslipidemia associated with type 2 diabetes mellitus (HCC)    Well-controlled diabetes with hemoglobin A1c of  7.3 Continue glipizide and Rybelsus Continue rosuvastatin 40 mg daily Diet and nutrition discussed The 10-year ASCVD risk score (Arnett DK, et al., 2019) is: 23.1%   Values used to calculate the score:     Age: 77 years     Sex: Female     Is Non-Hispanic African American: Yes     Diabetic: Yes     Tobacco smoker: No     Systolic Blood Pressure: 142 mmHg     Is BP treated: Yes     HDL Cholesterol: 45.5 mg/dL     Total Cholesterol: 130 mg/dL         Other   Insomnia    Ambien helping some. Okay to increase dose to 10 mg at bedtime as needed.      Relevant Medications   zolpidem (AMBIEN) 10 MG tablet   Patient Instructions  Diabetes Mellitus and Nutrition, Adult When you have diabetes, or diabetes mellitus, it is very important to have healthy eating habits because your blood sugar (glucose) levels are greatly affected by what you eat and drink. Eating healthy foods in the right amounts, at about the same times every day, can help you: Manage your blood glucose. Lower your risk of heart disease. Improve your blood pressure. Reach or maintain a healthy weight. What can affect my meal plan? Every person with diabetes is different, and each person has different needs for a meal plan. Your health care provider may recommend that you work with a dietitian to make a meal plan that is best for you. Your meal plan may vary depending on factors such as: The calories you need. The medicines you take. Your weight. Your blood glucose, blood pressure, and cholesterol levels. Your activity level. Other health conditions you have, such as heart or kidney disease. How do carbohydrates affect me? Carbohydrates, also called carbs, affect your blood glucose level more than any other type of food. Eating carbs raises the amount of glucose in your blood. It is important to know how many carbs you can safely have in each meal. This is different for every person. Your dietitian can help you  calculate how many carbs you should have at each meal and for each snack. How does alcohol affect me? Alcohol can cause a decrease in blood glucose (hypoglycemia), especially if you use insulin or take certain diabetes medicines by mouth. Hypoglycemia can be a life-threatening condition. Symptoms of hypoglycemia, such as sleepiness, dizziness, and confusion, are similar to symptoms of having too much alcohol. Do not drink alcohol if: Your health care provider tells you not to drink. You are pregnant, may be pregnant, or are planning to become pregnant. If you drink alcohol: Limit how much you have to: 0-1 drink a day for women. 0-2 drinks a day for men. Know how much alcohol is in your drink. In the [place] one drink equals one 12 oz bottle of beer (355 mL), one 5 oz glass of wine (148 mL), or one 1 oz glass of hard liquor (44 mL). Keep yourself hydrated with water, diet soda, or unsweetened iced tea. Keep in mind that regular soda, juice, and other mixers may contain a lot of sugar and must be counted as carbs. What are tips for following this plan?  Reading food labels Start by checking the serving size on the Nutrition Facts label of packaged foods and drinks.  The number of calories and the amount of carbs, fats, and other nutrients listed on the label are based on one serving of the item. Many items contain more than one serving per package. Check the total grams (g) of carbs in one serving. Check the number of grams of saturated fats and trans fats in one serving. Choose foods that have a low amount or none of these fats. Check the number of milligrams (mg) of salt (sodium) in one serving. Most people should limit total sodium intake to less than 2,300 mg per day. Always check the nutrition information of foods labeled as "low-fat" or "nonfat." These foods may be higher in added sugar or refined carbs and should be avoided. Talk to your dietitian to identify your daily goals for nutrients  listed on the label. Shopping Avoid buying canned, pre-made, or processed foods. These foods tend to be high in fat, sodium, and added sugar. Shop around the outside edge of the grocery store. This is where you will most often find fresh fruits and vegetables, bulk grains, fresh meats, and fresh dairy products. Cooking Use low-heat cooking methods, such as baking, instead of high-heat cooking methods, such as deep frying. Cook using healthy oils, such as olive, canola, or sunflower oil. Avoid cooking with butter, cream, or high-fat meats. Meal planning Eat meals and snacks regularly, preferably at the same times every day. Avoid going long periods of time without eating. Eat foods that are high in fiber, such as fresh fruits, vegetables, beans, and whole grains. Eat 4-6 oz (112-168 g) of lean protein each day, such as lean meat, chicken, fish, eggs, or tofu. One ounce (oz) (28 g) of lean protein is equal to: 1 oz (28 g) of meat, chicken, or fish. 1 egg.  cup (62 g) of tofu. Eat some foods each day that contain healthy fats, such as avocado, nuts, seeds, and fish. What foods should I eat? Fruits Berries. Apples. Oranges. Peaches. Apricots. Plums. Grapes. Mangoes. Papayas. Pomegranates. Kiwi. Cherries. Vegetables Leafy greens, including lettuce, spinach, kale, chard, collard greens, mustard greens, and cabbage. Beets. Cauliflower. Broccoli. Carrots. Green beans. Tomatoes. Peppers. Onions. Cucumbers. Brussels sprouts. Grains Whole grains, such as whole-wheat or whole-grain bread, crackers, tortillas, cereal, and pasta. Unsweetened oatmeal. Quinoa. Brown or wild rice. Meats and other proteins Seafood. Poultry without skin. Lean cuts of poultry and beef. Tofu. Nuts. Seeds. Dairy Low-fat or fat-free dairy products such as milk, yogurt, and cheese. The items listed above may not be a complete list of foods and beverages you can eat and drink. Contact a dietitian for more information. What foods  should I avoid? Fruits Fruits canned with syrup. Vegetables Canned vegetables. Frozen vegetables with butter or cream sauce. Grains Refined white flour and flour products such as bread, pasta, snack foods, and cereals. Avoid all processed foods. Meats and other proteins Fatty cuts of meat. Poultry with skin. Breaded or fried meats. Processed meat. Avoid saturated fats. Dairy Full-fat yogurt, cheese, or milk. Beverages Sweetened drinks, such as soda or iced tea. The items listed above may not be a complete list of foods and beverages you should avoid. Contact a dietitian for more information. Questions to ask a health care provider Do I need to meet with a certified diabetes care and education specialist? Do I need to meet with a dietitian? What number can I call if I have questions? When are the best times to check my blood glucose? Where to find more information: American Diabetes Association: diabetes.org Academy of  Nutrition and Dietetics: eatright.[employer] of Diabetes and Digestive and Kidney Diseases: StageSync.si Association of Diabetes Care & Education Specialists: diabeteseducator.org Summary It is important to have healthy eating habits because your blood sugar (glucose) levels are greatly affected by what you eat and drink. It is important to use alcohol carefully. A healthy meal plan will help you manage your blood glucose and lower your risk of heart disease. Your health care provider may recommend that you work with a dietitian to make a meal plan that is best for you. This information is not intended to replace advice given to you by your health care provider. Make sure you discuss any questions you have with your health care provider. Document Revised: 08/21/2019 Document Reviewed: 08/21/2019 Elsevier Patient Education  2024 Elsevier Inc.       Edwina Barth, MD [facility]

## 2023-01-08 ENCOUNTER — Encounter: Payer: Self-pay | Admitting: Emergency Medicine

## 2023-01-09 ENCOUNTER — Other Ambulatory Visit: Payer: Self-pay | Admitting: Radiology

## 2023-01-09 DIAGNOSIS — I10 Essential (primary) hypertension: Secondary | ICD-10-CM

## 2023-01-09 MED ORDER — AMLODIPINE BESYLATE 10 MG PO TABS: 10.0000 mg | ORAL_TABLET | Freq: Every day | ORAL | 3 refills | Status: DC

## 2023-01-11 ENCOUNTER — Ambulatory Visit (INDEPENDENT_AMBULATORY_CARE_PROVIDER_SITE_OTHER): Payer: PPO | Admitting: Orthopedic Surgery

## 2023-01-11 DIAGNOSIS — M65342 Trigger finger, left ring finger: Secondary | ICD-10-CM | POA: Diagnosis not present

## 2023-01-11 DIAGNOSIS — M65341 Trigger finger, right ring finger: Secondary | ICD-10-CM

## 2023-01-11 NOTE — Progress Notes (Signed)
   DRISANA HUI - 72 y.o. female MRN [id number]  Date of birth: 1950/09/23  Office Visit Note: Visit Date: 01/11/2023 PCP: Georgina Quint, MD Referred by: Georgina Quint, *  Subjective:  HPI: SAMIRIA SUNDELL is a 72 y.o. female who presents today for follow up 7 weeks status post right ring finger trigger release.  She is having ongoing clicking and locking of the left ring finger as seen previously.  Most recent treatment for the left ring finger was an injection in September of this year.  States that her pain at the left ring finger is getting more severe, is now requiring manual correction often with significant locking.  Pertinent ROS were reviewed with the patient and found to be negative unless otherwise specified above in HPI.   Assessment & Plan: Visit Diagnoses: No diagnosis found.  Plan: She has healed very well from the right ring finger trigger digit release.  Given that her symptoms are refractory to conservative care for the left ring finger trigger digit, we can proceed with left ring finger trigger digit release under local anesthesia.  Risks and benefits of the procedure were discussed, risks including but not limited to infection, bleeding, scarring, stiffness, nerve injury, tendon injury, vascular injury, recurrence of symptoms and need for subsequent operation.  Patient expressed understanding.      Follow-up: No follow-ups on file.   Meds & Orders: No orders of the defined types were placed in this encounter.  No orders of the defined types were placed in this encounter.    Procedures: No procedures performed       Objective:   Vital Signs: There were no vitals taken for this visit.  Ortho Exam Right hand: - Well-healed incision at the base of the ring finger - Near composite fist without residual clicking or locking, ring finger 0.5 cm from distal palmar crease actively, improved passively - Sensation intact throughout the hand, hand is warm  well-perfused  Left hand: - Significant tenderness, palpable nodule at the A1 pulley of the ring finger - Notable clicking and locking with deep flexion today, requires manual correction with significant pain - Sensation intact distally, hand is warm well-perfused   Imaging: No results found.   Ruven Corradi [name], M.D. [facility] 8:38 AM

## 2023-01-13 ENCOUNTER — Encounter: Payer: Self-pay | Admitting: Orthopedic Surgery

## 2023-01-13 NOTE — Telephone Encounter (Signed)
I spoke with the patient not long after she left message a return call message. Patient is scheduled for 01/19/23.

## 2023-01-19 ENCOUNTER — Other Ambulatory Visit: Payer: Self-pay | Admitting: Orthopedic Surgery

## 2023-01-19 ENCOUNTER — Telehealth: Payer: Self-pay | Admitting: Orthopedic Surgery

## 2023-01-19 DIAGNOSIS — M65342 Trigger finger, left ring finger: Secondary | ICD-10-CM | POA: Diagnosis not present

## 2023-01-19 MED ORDER — HYDROCODONE-ACETAMINOPHEN 5-325 MG PO TABS: 1.0000 | ORAL_TABLET | Freq: Four times a day (QID) | ORAL | 0 refills | Status: DC | PRN

## 2023-01-19 NOTE — Telephone Encounter (Signed)
Letter is in chart.

## 2023-01-19 NOTE — Telephone Encounter (Signed)
Patient called asked if a note can be written stating that her surgery time was changed.Patient said her daughter need to take the note to her employer. Patient asked that the note be put in mychart so that she can print it off.  The number to contact patient is [phone]

## 2023-01-30 ENCOUNTER — Ambulatory Visit (INDEPENDENT_AMBULATORY_CARE_PROVIDER_SITE_OTHER): Payer: PPO | Admitting: Orthopedic Surgery

## 2023-01-30 DIAGNOSIS — M65342 Trigger finger, left ring finger: Secondary | ICD-10-CM

## 2023-01-30 NOTE — Progress Notes (Signed)
   Erin Benitez - 72 y.o. female MRN [id number]  Date of birth: 1950/10/16  Office Visit Note: Visit Date: 01/30/2023 PCP: Georgina Quint, MD Referred by: Georgina Quint, *  Subjective:  HPI: Erin Benitez is a 72 y.o. female who presents today for follow up 10 days status post left ring finger trigger release.  She is doing well overall, pain is well-controlled.  Pertinent ROS were reviewed with the patient and found to be negative unless otherwise specified above in HPI.   Assessment & Plan: Visit Diagnoses: No diagnosis found.  Plan: He is doing very well postoperatively.  Range of motion of the left hand today demonstrates appropriate tendon gliding without residual clicking or locking.  I would like to leave her sutures in for another few days to allow for ongoing healing.  She will return next week for wound check and suture removal at that time.  She still has her home exercises from the contralateral side for trigger digit, she can do home exercises without need for formal therapy.  Follow-up: No follow-ups on file.   Meds & Orders: No orders of the defined types were placed in this encounter.  No orders of the defined types were placed in this encounter.    Procedures: No procedures performed       Objective:   Vital Signs: There were no vitals taken for this visit.  Ortho Exam Left hand: - Well-healing incision at the base of the ring finger, skin is well-approximated with sutures in place, no erythema or drainage - Able to perform digital range of motion without residual clicking or locking - Sensation intact distally, hand is warm well-perfused   Imaging: No results found.   Erin Benitez, M.D. [facility] 9:50 AM

## 2023-02-06 ENCOUNTER — Ambulatory Visit (INDEPENDENT_AMBULATORY_CARE_PROVIDER_SITE_OTHER): Payer: PPO | Admitting: Orthopedic Surgery

## 2023-02-06 ENCOUNTER — Encounter: Payer: PPO | Admitting: Orthopedic Surgery

## 2023-02-06 DIAGNOSIS — Z9889 Other specified postprocedural states: Secondary | ICD-10-CM

## 2023-02-06 NOTE — Progress Notes (Signed)
   Erin Benitez - 73 y.o. female MRN [id number]  Date of birth: March 28, 1950  Office Visit Note: Visit Date: 02/06/2023 PCP: [name], MD Referred by: [name], *  Subjective:  HPI: Erin Benitez is a 73 y.o. female who presents today for follow up 2 weeks status post left ring finger trigger release.  She is doing very well overall.  Her range of motion is improving significantly.  Pain is well-controlled.  Pertinent ROS were reviewed with the patient and found to be negative unless otherwise specified above in HPI.   Assessment & Plan: Visit Diagnoses: No diagnosis found.  Plan: She has done very well postoperatively.  Sutures were removed today without incident.  She is comfortable with home exercise program given that she had a contralateral trigger digit release recently.  Activities as tolerated moving forward, follow-up as needed.  She is very pleased with her outcome.  Follow-up: No follow-ups on file.   Meds & Orders: No orders of the defined types were placed in this encounter.  No orders of the defined types were placed in this encounter.    Procedures: No procedures performed       Objective:   Vital Signs: There were no vitals taken for this visit.  Ortho Exam Left hand: - Well-healing incision at the base of the ring finger, skin is well-approximated, no erythema or drainage - Able to perform digital range of motion without residual clicking or locking - Sensation intact distally, hand is warm well-perfused   Imaging: No results found.   Deyci Gesell Afton Alderton, M.D. [facility] 3:09 PM

## 2023-02-24 ENCOUNTER — Telehealth: Payer: Self-pay

## 2023-02-24 NOTE — Progress Notes (Signed)
Care Guide Pharmacy Note  02/24/2023 Name: Erin Benitez MRN: [id number] DOB: Jun 20, 1950  Referred By: Georgina Quint, MD Reason for referral: Care Coordination (TNM Diabetes. )   Erin Benitez is a 73 y.o. year old female who is a primary care patient of Alvy Bimler, Eilleen Kempf, MD.  Erin Benitez was referred to the pharmacist for assistance related to: DMII  An unsuccessful telephone outreach was attempted today to contact the patient who was referred to the pharmacy team for assistance with diabetes. Additional attempts will be made to contact the patient.  [facility]  [facility], [facility] Health Care Management Assistant Direct Dial: [phone]  Fax: [phone]

## 2023-02-28 ENCOUNTER — Telehealth: Payer: Self-pay

## 2023-02-28 NOTE — Progress Notes (Signed)
Care Guide Pharmacy Note  02/28/2023 Name: Erin Benitez MRN: [id number] DOB: 09-28-1950  Referred By: Georgina Quint, MD Reason for referral: Care Coordination (TNM Diabetes. )   Erin Benitez is a 73 y.o. year old female who is a primary care patient of Alvy Bimler, Eilleen Kempf, MD.  [name] was referred to the pharmacist for assistance related to: DMII  A second unsuccessful telephone outreach was attempted today to contact the patient who was referred to the pharmacy team for assistance with diabetes. Additional attempts will be made to contact the patient.  [facility]  [facility], [facility] Health Care Management Assistant Direct Dial: [phone]  Fax: [phone]

## 2023-03-02 ENCOUNTER — Telehealth: Payer: Self-pay

## 2023-03-02 NOTE — Progress Notes (Signed)
Care Guide Pharmacy Note  03/02/2023 Name: Erin Benitez MRN: [id number] DOB: 1950-05-15  Referred By: Georgina Quint, MD Reason for referral: Care Coordination (TNM Diabetes. )   Erin Benitez is a 73 y.o. year old female who is a primary care patient of Alvy Bimler, Eilleen Kempf, MD.  Erin Benitez was referred to the pharmacist for assistance related to: DMII  A third unsuccessful telephone outreach was attempted today to contact the patient who was referred to the pharmacy team for assistance with diabetes. The Population Health team is pleased to engage with this patient at any time in the future upon receipt of referral and should he/she be interested in assistance from the [facility] Health team.  Baruch Gouty [facility]  [facility], [facility] Health Care Management Assistant Direct Dial: [phone]  Fax: [phone]

## 2023-03-07 ENCOUNTER — Other Ambulatory Visit: Payer: Self-pay | Admitting: Emergency Medicine

## 2023-03-07 DIAGNOSIS — E1159 Type 2 diabetes mellitus with other circulatory complications: Secondary | ICD-10-CM

## 2023-03-25 ENCOUNTER — Other Ambulatory Visit: Payer: Self-pay | Admitting: Emergency Medicine

## 2023-03-25 DIAGNOSIS — Z0000 Encounter for general adult medical examination without abnormal findings: Secondary | ICD-10-CM

## 2023-04-05 ENCOUNTER — Encounter: Payer: Self-pay | Admitting: Emergency Medicine

## 2023-04-05 ENCOUNTER — Ambulatory Visit: Payer: PPO | Admitting: Emergency Medicine

## 2023-04-05 ENCOUNTER — Ambulatory Visit (INDEPENDENT_AMBULATORY_CARE_PROVIDER_SITE_OTHER)

## 2023-04-05 VITALS — BP 160/88 | HR 70 | Temp 98.9°F | Ht 64.0 in | Wt 148.0 lb

## 2023-04-05 DIAGNOSIS — I152 Hypertension secondary to endocrine disorders: Secondary | ICD-10-CM

## 2023-04-05 DIAGNOSIS — Z7984 Long term (current) use of oral hypoglycemic drugs: Secondary | ICD-10-CM | POA: Diagnosis not present

## 2023-04-05 DIAGNOSIS — I771 Stricture of artery: Secondary | ICD-10-CM | POA: Diagnosis not present

## 2023-04-05 DIAGNOSIS — E1169 Type 2 diabetes mellitus with other specified complication: Secondary | ICD-10-CM | POA: Diagnosis not present

## 2023-04-05 DIAGNOSIS — E1159 Type 2 diabetes mellitus with other circulatory complications: Secondary | ICD-10-CM

## 2023-04-05 DIAGNOSIS — E785 Hyperlipidemia, unspecified: Secondary | ICD-10-CM | POA: Diagnosis not present

## 2023-04-05 DIAGNOSIS — I700 Atherosclerosis of aorta: Secondary | ICD-10-CM

## 2023-04-05 DIAGNOSIS — R079 Chest pain, unspecified: Secondary | ICD-10-CM | POA: Diagnosis not present

## 2023-04-05 DIAGNOSIS — I200 Unstable angina: Secondary | ICD-10-CM

## 2023-04-05 LAB — COMPREHENSIVE METABOLIC PANEL
ALT: 23 U/L (ref 0–35)
AST: 18 U/L (ref 0–37)
Albumin: 4.8 g/dL (ref 3.5–5.2)
Alkaline Phosphatase: 149 U/L — ABNORMAL HIGH (ref 39–117)
BUN: 13 mg/dL (ref 6–23)
CO2: 29 meq/L (ref 19–32)
Calcium: 9.8 mg/dL (ref 8.4–10.5)
Chloride: 99 meq/L (ref 96–112)
Creatinine, Ser: 0.79 mg/dL (ref 0.40–1.20)
GFR: 74.73 mL/min (ref >60.00)
Glucose, Bld: 264 mg/dL — ABNORMAL HIGH (ref 70–99)
Potassium: 4.2 meq/L (ref 3.5–5.1)
Sodium: 136 meq/L (ref 135–145)
Total Bilirubin: 0.7 mg/dL (ref 0.2–1.2)
Total Protein: 8.1 g/dL (ref 6.0–8.3)

## 2023-04-05 LAB — LIPID PANEL
Cholesterol: 181 mg/dL (ref 0–200)
HDL: 53.9 mg/dL (ref >39.00)
LDL Cholesterol: 88 mg/dL (ref 0–99)
NonHDL: 126.62
Total CHOL/HDL Ratio: 3
Triglycerides: 194 mg/dL — ABNORMAL HIGH (ref 0.0–149.0)
VLDL: 38.8 mg/dL (ref 0.0–40.0)

## 2023-04-05 LAB — POCT GLYCOSYLATED HEMOGLOBIN (HGB A1C): Hemoglobin A1C: 10.4 % — AB (ref 4.0–5.6)

## 2023-04-05 LAB — CBC WITH DIFFERENTIAL/PLATELET
Basophils Absolute: 0 10*3/uL (ref 0.0–0.1)
Basophils Relative: 0.7 % (ref 0.0–3.0)
Eosinophils Absolute: 0.2 10*3/uL (ref 0.0–0.7)
Eosinophils Relative: 5.6 % — ABNORMAL HIGH (ref 0.0–5.0)
HCT: 44.4 % (ref 36.0–46.0)
Hemoglobin: 14.3 g/dL (ref 12.0–15.0)
Lymphocytes Relative: 40.1 % (ref 12.0–46.0)
Lymphs Abs: 1.7 10*3/uL (ref 0.7–4.0)
MCHC: 32.2 g/dL (ref 30.0–36.0)
MCV: 87.2 fl (ref 78.0–100.0)
Monocytes Absolute: 0.5 10*3/uL (ref 0.1–1.0)
Monocytes Relative: 11.9 % (ref 3.0–12.0)
Neutro Abs: 1.7 10*3/uL (ref 1.4–7.7)
Neutrophils Relative %: 41.7 % — ABNORMAL LOW (ref 43.0–77.0)
Platelets: 237 10*3/uL (ref 150.0–400.0)
RBC: 5.09 Mil/uL (ref 3.87–5.11)
RDW: 14.2 % (ref 11.5–15.5)
WBC: 4.1 10*3/uL (ref 4.0–10.5)

## 2023-04-05 MED ORDER — EMPAGLIFLOZIN 10 MG PO TABS: 10.0000 mg | ORAL_TABLET | Freq: Every day | ORAL | 3 refills | Status: DC

## 2023-04-05 NOTE — Assessment & Plan Note (Signed)
Diet and nutrition discussed.  Continue rosuvastatin 40 mg daily. 

## 2023-04-05 NOTE — Patient Instructions (Signed)
 Chest Pain (Angina): What to Know Angina is pain or discomfort in the chest. It can also be felt in the neck, arm, jaw, or back. Angina is caused by not having enough blood flow to the heart wall. Angina may be a warning that you're at risk for having a heart attack. What are the causes? Angina is most often caused by build-up of plaque in your arteries that makes it hard for blood to flow. Plaque narrows and blocks the arteries of the heart. Plaque is made of fats and cholesterol. Angina is also caused by: Sudden spasms of the muscles in the arteries of the heart. Small artery disease. Heart valve problems. A tear in an artery of your heart. Weakness of the heart muscle. What increases the risk? Main risks Having high cholesterol. High blood pressure. Having diabetes. Family history of heart disease. Not exercising or moving enough. Having had radiation treatment to the left side of your chest. Other risks Using tobacco products. Being very overweight. Eating foods that have a lot of unhealthy fats. Feeling stressed or having depression. Using drugs, such as cocaine. What are the signs or symptoms? Symptoms in all people Chest pain, which may: Feel like a crushing or squeezing in the chest. Feel like a tightness, pressure, or heaviness in the chest. Last for more than a few minutes at a time. Stop and come back. Pain in the neck, arm, jaw, or back. Heartburn or upset stomach for no reason. Being short of breath. Feeling like you may throw up. Sudden cold sweats. Other symptoms in females Tiredness or weakness. Worry and anxiety. Dizziness or fainting. How is this diagnosed?  Your symptoms and medical history. Blood tests. Electrocardiogram (ECG) to measure the electrical activity of your heart. Stress test to look for signs of a blocked artery. CT angiogram to examine your heart and the blood flow to it. Coronary angiogram to check for a blocked artery. How is this  treated? Medicines to: Prevent blood clots. Relax blood vessels and improve blood flow to the heart. Lower blood pressure. Reduce cholesterol. You may have a procedure called angioplasty to widen a narrowed or blocked artery. A small mesh tube called a stent may be put in the artery to keep it open. Surgery may be needed to allow blood to go around a blocked artery. Follow these instructions at home: Medicines Take your medicines only as told. Do not take these medicines unless your provider says that you can: NSAIDs, such as ibuprofen and naproxen. Supplements that contain vitamin A, vitamin E, or both. Hormone therapy that contains estrogen with or without progestin. Eating and drinking  Eat a healthy diet that includes: Lots of fresh fruits and vegetables. Whole grains. Low-fat protein. Low-fat dairy products. Follow instructions about what you may eat and drink. Activity Exercise as told. Talk with your provider about doing a program called cardiac rehab to help make your heart strong. When you feel tired, take a break. Plan breaks if you know you're going to feel tired. Lifestyle Do not smoke, vape, or use nicotine or tobacco. If your provider says you can drink alcohol: Limit how much you have to: 0-1 drink a day if you're female and not pregnant. 0-2 drinks a day if you're female. Know how much alcohol is in your drink. In the [place] one drink is one 12 oz bottle of beer (355 mL), one 5 oz glass of wine (148 mL), or one 1 oz glass of hard liquor (44 mL). General instructions  Stay at a healthy weight. If told to lose weight, work with your provider to lose weight safely. Keep your vaccines up to date. Get a flu shot every year. Learn to manage stress. If you need help, ask your provider. Talk with your provider if you feel depressed. Work with your provider to manage any other health problems that you have. These may include diabetes or high blood pressure. Keep all  follow-up visits. Your provider will want to check on your condition. Get help right away if: You have pain in your chest, neck, arm, jaw, or back, and the pain: Happens more often. Lasts more than a few minutes. Goes away and comes back. Does not get better after you take medicine under your tongue. You're dizzy or light-headed all of a sudden. You faint. You have any combination of these problems: Cold sweats. Heartburn or upset stomach. Trouble breathing. Feeling like you may throw up, or you throw up. Feeling very tired or weak. Feeling worried or nervous. These symptoms may be an emergency. Call 911 right away. Do not wait to see if the symptoms will go away. Do not drive yourself to the hospital. This information is not intended to replace advice given to you by your health care provider. Make sure you discuss any questions you have with your health care provider. Document Revised: 11/29/2022 Document Reviewed: 06/12/2022 Elsevier Patient Education  2024 [employer].

## 2023-04-05 NOTE — Progress Notes (Signed)
 Erin Benitez 73 y.o.   Chief Complaint  Patient presents with   Follow-up    Patient here for 3 month f/u for DM. Patient c/o of chest pain and right arm. Patient states it is tender to the touch and it has moved a little this started Monday. She states taking an Asprin afterwards and states it did get better is why she did not go get seen. She is sure its from stress but not 100%     HISTORY OF PRESENT ILLNESS: This is a 73 y.o. female here for 91-month follow-up of diabetes and hypertension Had episode of pressure across her chest last Monday.  1 at 7 in the morning and second 1 at 11 in the morning that lasted about 1 hour each with some radiation to both arms.  No other associated symptoms.  Stress is contributing. Only taking glipizide for diabetes. No other complaints or medical concerns today.  HPI   Prior to Admission medications   Medication Sig Start Date End Date Taking? Authorizing Provider  amLODipine (NORVASC) 10 MG tablet Take 1 tablet (10 mg total) by mouth daily. 01/09/23  Yes Freddy Spadafora, Eilleen Kempf, MD  glipiZIDE (GLUCOTROL) 10 MG tablet TAKE 1 TABLET BY MOUTH ONCE DAILY BEFORE BREAKFAST 03/07/23  Yes Orpah Hausner, Eilleen Kempf, MD  Glucagon (GVOKE HYPOPEN 2-PACK) 0.5 MG/0.1ML SOAJ Inject 0.5 mg into the skin as needed. 10/06/22  Yes Taz Vanness, Eilleen Kempf, MD  metoprolol succinate (TOPROL-XL) 25 MG 24 hr tablet Take 1 tablet (25 mg total) by mouth daily. 10/06/22  Yes Lisa Milian, Eilleen Kempf, MD  pantoprazole (PROTONIX) 40 MG tablet Take 1 tablet (40 mg total) by mouth daily. 01/05/22  Yes Andrus Sharp, Eilleen Kempf, MD  rosuvastatin (CRESTOR) 40 MG tablet Take 1 tablet (40 mg total) by mouth daily. 10/06/22  Yes Shirely Toren, Eilleen Kempf, MD  Semaglutide 7 MG TABS Take 1 tablet (7 mg total) by mouth daily. 10/06/22  Yes Vernee Baines, Eilleen Kempf, MD  HYDROcodone-acetaminophen (NORCO) 5-325 MG tablet Take 1 tablet by mouth every 6 (six) hours as needed. Patient not taking: Reported on 04/05/2023 01/19/23    Agarwala, Lajuan Lines, MD  zolpidem (AMBIEN) 10 MG tablet Take 1 tablet (10 mg total) by mouth at bedtime as needed for sleep. 01/05/23 02/04/23  Georgina Quint, MD    Allergies  Allergen Reactions   Lisinopril Swelling   Peanut-Containing Drug Products Anaphylaxis, Hives, Swelling and Other (See Comments)    Pistachio's - swelling of the throat      Tramadol Nausea And Vomiting    Headache & dizziness with n&v    Patient Active Problem List   Diagnosis Date Noted   Trigger finger, right ring finger 03/16/2022   Insomnia 10/06/2021   NAFLD (nonalcoholic fatty liver disease) 40/98/1191   Protrusion of cervical intervertebral disc 10/21/2020   Spinal stenosis of cervical region 10/21/2020   Chronic left shoulder pain 07/07/2020   Dyslipidemia associated with type 2 diabetes mellitus (HCC) 09/06/2017   Diverticulosis of colon without hemorrhage 03/09/2015   Hyperlipidemia 03/09/2015   Gastroesophageal reflux disease without esophagitis 03/09/2015   Hypertension associated with diabetes (HCC) 04/01/2013    Past Medical History:  Diagnosis Date   Allergy    Asthma    Bronchitis    Cataract    Diverticulosis    DM2 (diabetes mellitus, type 2) (HCC)    GERD (gastroesophageal reflux disease)    Heart murmur    Hyperplastic colon polyp    Hypertension     Past  Surgical History:  Procedure Laterality Date   CESAREAN SECTION     COLONOSCOPY     ESOPHAGOGASTRODUODENOSCOPY     TONSILLECTOMY      Social History   Socioeconomic History   Marital status: Single    Spouse name: Not on file   Number of children: 1   Years of education: Not on file   Highest education level: Associate degree: occupational, Scientist, product/process development, or vocational program  Occupational History   Occupation: Educator  Tobacco Use   Smoking status: Never   Smokeless tobacco: Never  Vaping Use   Vaping status: Never Used  Substance and Sexual Activity   Alcohol use: No   Drug use: No   Sexual activity:  Yes  Other Topics Concern   Not on file  Social History Narrative   She reports she is single. She has 1 daughter. She is an Programmer, systems.  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     2 caffeinated beverages daily.   No alcohol tobacco or drug use.   Social Drivers of Health   Financial Resource Strain: Medium Risk (04/04/2023)   Overall Financial Resource Strain (CARDIA)    Difficulty of Paying Living Expenses: Somewhat hard  Food Insecurity: Food Insecurity Present (04/04/2023)   Hunger Vital Sign    Worried About Running Out of Food in the Last Year: Sometimes true    Ran Out of Food in the Last Year: Sometimes true  Transportation Needs: No Transportation Needs (04/04/2023)   PRAPARE - Administrator, Civil Service (Medical): No    Lack of Transportation (Non-Medical): No  Physical Activity: Sufficiently Active (04/04/2023)   Exercise Vital Sign    Days of Exercise per Week: 4 days    Minutes of Exercise per Session: 40 min  Recent Concern: Physical Activity - Insufficiently Active (01/04/2023)   Exercise Vital Sign    Days of Exercise per Week: 3 days    Minutes of Exercise per Session: 40 min  Stress: Stress Concern Present (04/04/2023)   [employer] of Occupational Health - Occupational Stress Questionnaire    Feeling of Stress : Rather much  Social Connections: Unknown (04/04/2023)   Social Connection and Isolation Panel [NHANES]    Frequency of Communication with Friends and Family: Once a week    Frequency of Social Gatherings with Friends and Family: Patient declined    Attends Religious Services: More than 4 times per year    Active Member of [employer] or Organizations: Yes    Attends Banker Meetings: More than 4 times per year    Marital Status: Patient declined  Intimate Partner Violence: Not At Risk (11/02/2020)    Humiliation, Afraid, Rape, and Kick questionnaire    Fear of Current or Ex-Partner: No    Emotionally Abused: No    Physically Abused: No    Sexually Abused: No    Family History  Problem Relation Age of Onset   Cancer Mother        ovarian cancer   Heart disease Mother    Liver disease Mother    Hyperlipidemia Mother    Stroke Mother    Cancer Father        lung cancer   Cancer Sister    Hyperlipidemia Sister    Cancer Sister    Hyperlipidemia Sister    Diabetes Maternal Aunt  Colon cancer Maternal Aunt        x2 aunts   Colon cancer Maternal Uncle        2 uncles   Colon cancer Paternal Grandfather    Esophageal cancer Neg Hx    Rectal cancer Neg Hx    Stomach cancer Neg Hx      Review of Systems  Constitutional: Negative.  Negative for chills and fever.  HENT:  Negative for congestion and sore throat.   Respiratory: Negative.  Negative for cough and shortness of breath.   Cardiovascular:  Positive for chest pain.  Gastrointestinal:  Negative for abdominal pain, diarrhea, nausea and vomiting.  Genitourinary: Negative.  Negative for dysuria and hematuria.  Skin: Negative.  Negative for rash.  Neurological: Negative.  Negative for dizziness and headaches.  All other systems reviewed and are negative.   Vitals:   04/05/23 0820  BP: (!) 160/88  Pulse: 70  Temp: 98.9 F (37.2 C)  SpO2: 96%    Physical Exam HENT:     Head: Normocephalic.     Mouth/Throat:     Mouth: Mucous membranes are moist.     Pharynx: Oropharynx is clear.  Eyes:     Extraocular Movements: Extraocular movements intact.     Pupils: Pupils are equal, round, and reactive to light.  Cardiovascular:     Rate and Rhythm: Normal rate and regular rhythm.     Heart sounds: Murmur (Systolic 2/6) heard.  Abdominal:     Palpations: Abdomen is soft.     Tenderness: There is no abdominal tenderness.  Musculoskeletal:     Cervical back: No tenderness.  Lymphadenopathy:     Cervical: No  cervical adenopathy.  Skin:    General: Skin is warm and dry.     Capillary Refill: Capillary refill takes less than 2 seconds.  Neurological:     General: No focal deficit present.     Mental Status: She is alert and oriented to person, place, and time.  Psychiatric:        Mood and Affect: Mood normal.        Behavior: Behavior normal.    EKG: Normal sinus rhythm with ventricular rate of 70/min.  Nonspecific T wave abnormality.  No acute ischemic changes.  Results for orders placed or performed in visit on 04/05/23 (from the past 24 hours)  POCT HgB A1C     Status: Abnormal   Collection Time: 04/05/23  8:35 AM  Result Value Ref Range   Hemoglobin A1C 10.4 (A) 4.0 - 5.6 %   HbA1c POC (<> result, manual entry)     HbA1c, POC (prediabetic range)     HbA1c, POC (controlled diabetic range)       ASSESSMENT & PLAN: A total of 47 minutes was spent with the patient and counseling/coordination of care regarding preparing for this visit, review of most recent office visit notes, review of multiple chronic medical conditions and their management, cardiovascular risks associated with uncontrolled diabetes, review of all medications and changes made, diagnosis of unstable angina and need for cardiology evaluation, ED precautions, review of most recent bloodwork results including interpretation of today's hemoglobin A1c, review of health maintenance items, education on nutrition, prognosis, documentation, and need for follow up.   Problem List Items Addressed This Visit       Cardiovascular and Mediastinum   Hypertension associated with diabetes (HCC) - Primary   Elevated blood pressure in the office.  Normal blood pressure readings at home Continue amlodipine  10 mg daily, metoprolol succinate 25 mg daily Uncontrolled diabetes with hemoglobin A1c of 10.4 Not taking Rybelsus.  Too expensive.  Only taking glipizide. Cardiovascular risks associated with hypertension and diabetes  discussed Continue glipizide 10 mg twice a day and start Jardiance 10 mg daily Diet and nutrition discussed Follow-up in 3 months      Relevant Medications   empagliflozin (JARDIANCE) 10 MG TABS tablet   Other Relevant Orders   CBC with Differential/Platelet   Comprehensive metabolic panel   Lipid panel   DG Chest 2 View   Aortic atherosclerosis (HCC)   Diet and nutrition discussed Continue rosuvastatin 40 mg daily      Unstable angina (HCC)   Diabetic and hypertensive.  Most likely has underlying CAD. Needs cardiology evaluation.  Urgent referral placed today. Nonspecific changes on EKG Symptoms suggestive of unstable angina Recommend to continue daily baby aspirin until seen by cardiologist ED precautions given       Relevant Orders   Ambulatory referral to Cardiology   DG Chest 2 View   EKG 12-Lead     Endocrine   Dyslipidemia associated with type 2 diabetes mellitus (HCC)   Uncontrolled diabetes with hemoglobin A1c at 10.4 Start Jardiance 10 mg and continue glipizide 10 mg twice a day Not taking Rybelsus due to cost. Cardiovascular risks associated with diabetes and dyslipidemia discussed Diet and nutrition discussed Continue rosuvastatin 40 mg daily      Relevant Medications   empagliflozin (JARDIANCE) 10 MG TABS tablet   Other Relevant Orders   POCT HgB A1C (Completed)   CBC with Differential/Platelet   Comprehensive metabolic panel   Lipid panel   Patient Instructions  Chest Pain (Angina): What to Know Angina is pain or discomfort in the chest. It can also be felt in the neck, arm, jaw, or back. Angina is caused by not having enough blood flow to the heart wall. Angina may be a warning that you're at risk for having a heart attack. What are the causes? Angina is most often caused by build-up of plaque in your arteries that makes it hard for blood to flow. Plaque narrows and blocks the arteries of the heart. Plaque is made of fats and cholesterol. Angina  is also caused by: Sudden spasms of the muscles in the arteries of the heart. Small artery disease. Heart valve problems. A tear in an artery of your heart. Weakness of the heart muscle. What increases the risk? Main risks Having high cholesterol. High blood pressure. Having diabetes. Family history of heart disease. Not exercising or moving enough. Having had radiation treatment to the left side of your chest. Other risks Using tobacco products. Being very overweight. Eating foods that have a lot of unhealthy fats. Feeling stressed or having depression. Using drugs, such as cocaine. What are the signs or symptoms? Symptoms in all people Chest pain, which may: Feel like a crushing or squeezing in the chest. Feel like a tightness, pressure, or heaviness in the chest. Last for more than a few minutes at a time. Stop and come back. Pain in the neck, arm, jaw, or back. Heartburn or upset stomach for no reason. Being short of breath. Feeling like you may throw up. Sudden cold sweats. Other symptoms in females Tiredness or weakness. Worry and anxiety. Dizziness or fainting. How is this diagnosed?  Your symptoms and medical history. Blood tests. Electrocardiogram (ECG) to measure the electrical activity of your heart. Stress test to look for signs of a blocked artery. CT  angiogram to examine your heart and the blood flow to it. Coronary angiogram to check for a blocked artery. How is this treated? Medicines to: Prevent blood clots. Relax blood vessels and improve blood flow to the heart. Lower blood pressure. Reduce cholesterol. You may have a procedure called angioplasty to widen a narrowed or blocked artery. A small mesh tube called a stent may be put in the artery to keep it open. Surgery may be needed to allow blood to go around a blocked artery. Follow these instructions at home: Medicines Take your medicines only as told. Do not take these medicines unless your  provider says that you can: NSAIDs, such as ibuprofen and naproxen. Supplements that contain vitamin A, vitamin E, or both. Hormone therapy that contains estrogen with or without progestin. Eating and drinking  Eat a healthy diet that includes: Lots of fresh fruits and vegetables. Whole grains. Low-fat protein. Low-fat dairy products. Follow instructions about what you may eat and drink. Activity Exercise as told. Talk with your provider about doing a program called cardiac rehab to help make your heart strong. When you feel tired, take a break. Plan breaks if you know you're going to feel tired. Lifestyle Do not smoke, vape, or use nicotine or tobacco. If your provider says you can drink alcohol: Limit how much you have to: 0-1 drink a day if you're female and not pregnant. 0-2 drinks a day if you're female. Know how much alcohol is in your drink. In the [place] one drink is one 12 oz bottle of beer (355 mL), one 5 oz glass of wine (148 mL), or one 1 oz glass of hard liquor (44 mL). General instructions Stay at a healthy weight. If told to lose weight, work with your provider to lose weight safely. Keep your vaccines up to date. Get a flu shot every year. Learn to manage stress. If you need help, ask your provider. Talk with your provider if you feel depressed. Work with your provider to manage any other health problems that you have. These may include diabetes or high blood pressure. Keep all follow-up visits. Your provider will want to check on your condition. Get help right away if: You have pain in your chest, neck, arm, jaw, or back, and the pain: Happens more often. Lasts more than a few minutes. Goes away and comes back. Does not get better after you take medicine under your tongue. You're dizzy or light-headed all of a sudden. You faint. You have any combination of these problems: Cold sweats. Heartburn or upset stomach. Trouble breathing. Feeling like you may throw  up, or you throw up. Feeling very tired or weak. Feeling worried or nervous. These symptoms may be an emergency. Call 911 right away. Do not wait to see if the symptoms will go away. Do not drive yourself to the hospital. This information is not intended to replace advice given to you by your health care provider. Make sure you discuss any questions you have with your health care provider. Document Revised: 11/29/2022 Document Reviewed: 06/12/2022 Elsevier Patient Education  2024 Elsevier Inc.     Edwina Barth, MD [facility]

## 2023-04-05 NOTE — Assessment & Plan Note (Signed)
 Diabetic and hypertensive.  Most likely has underlying CAD. Needs cardiology evaluation.  Urgent referral placed today. Nonspecific changes on EKG Symptoms suggestive of unstable angina Recommend to continue daily baby aspirin until seen by cardiologist ED precautions given

## 2023-04-05 NOTE — Assessment & Plan Note (Signed)
 Elevated blood pressure in the office.  Normal blood pressure readings at home Continue amlodipine 10 mg daily, metoprolol succinate 25 mg daily Uncontrolled diabetes with hemoglobin A1c of 10.4 Not taking Rybelsus.  Too expensive.  Only taking glipizide. Cardiovascular risks associated with hypertension and diabetes discussed Continue glipizide 10 mg twice a day and start Jardiance 10 mg daily Diet and nutrition discussed Follow-up in 3 months

## 2023-04-05 NOTE — Assessment & Plan Note (Signed)
 Uncontrolled diabetes with hemoglobin A1c at 10.4 Start Jardiance 10 mg and continue glipizide 10 mg twice a day Not taking Rybelsus due to cost. Cardiovascular risks associated with diabetes and dyslipidemia discussed Diet and nutrition discussed Continue rosuvastatin 40 mg daily

## 2023-04-13 ENCOUNTER — Ambulatory Visit
Admission: RE | Admit: 2023-04-13 | Discharge: 2023-04-13 | Disposition: A | Discharge status: Home / Self Care | Payer: PPO | Source: Ambulatory Visit | Attending: Emergency Medicine | Admitting: Emergency Medicine

## 2023-04-13 DIAGNOSIS — Z1231 Encounter for screening mammogram for malignant neoplasm of breast: Secondary | ICD-10-CM | POA: Diagnosis not present

## 2023-04-13 DIAGNOSIS — Z0000 Encounter for general adult medical examination without abnormal findings: Secondary | ICD-10-CM

## 2023-04-18 ENCOUNTER — Encounter: Payer: Self-pay | Admitting: Emergency Medicine

## 2023-04-19 ENCOUNTER — Encounter: Payer: Self-pay | Admitting: Cardiovascular Disease

## 2023-05-08 ENCOUNTER — Telehealth: Payer: Self-pay

## 2023-05-08 NOTE — Telephone Encounter (Signed)
 Patient was identified as falling into the [place] Measure - Diabetes.   Patient was: Left voicemail to schedule with primary care provider.

## 2023-05-30 ENCOUNTER — Ambulatory Visit: Attending: Cardiovascular Disease | Admitting: Cardiovascular Disease

## 2023-05-30 ENCOUNTER — Encounter: Payer: Self-pay | Admitting: Cardiovascular Disease

## 2023-05-30 VITALS — BP 144/75 | HR 91 | Ht 64.0 in | Wt 147.6 lb

## 2023-05-30 DIAGNOSIS — Z79899 Other long term (current) drug therapy: Secondary | ICD-10-CM

## 2023-05-30 DIAGNOSIS — I10 Essential (primary) hypertension: Secondary | ICD-10-CM | POA: Diagnosis not present

## 2023-05-30 DIAGNOSIS — R0609 Other forms of dyspnea: Secondary | ICD-10-CM

## 2023-05-30 MED ORDER — HYDROCHLOROTHIAZIDE 25 MG PO TABS: 25.0000 mg | ORAL_TABLET | Freq: Every day | ORAL | 3 refills | Status: AC

## 2023-05-30 MED ORDER — POTASSIUM CHLORIDE ER 10 MEQ PO TBCR: 10.0000 meq | EXTENDED_RELEASE_TABLET | Freq: Two times a day (BID) | ORAL | 3 refills | Status: AC

## 2023-05-30 MED ORDER — METOPROLOL TARTRATE 100 MG PO TABS
ORAL_TABLET | ORAL | 0 refills | Status: DC
Start: 2023-05-30 — End: 2023-10-30

## 2023-05-30 NOTE — Progress Notes (Signed)
 Cardiology Office Note:    Date:  05/30/2023   ID:  Erin Benitez, DOB 06/06/1950, MRN [id number]  PCP:  Elvira Hammersmith, MD   [facility] Providers Cardiologist:  Shuntavia Yerby  Click to update primary MD,subspecialty MD or APP then REFRESH:1}    Referring MD: Elvira Hammersmith, *   Chief Complaint  Patient presents with   Chest Pain     History of Present Illness:    08/04/2021  Erin Benitez is a 73 y.o. female with a hx of HTN, chest pain , CM, murmur .  She had been having shortness of breath for 3-4 weeks. Especially with exertion Also at night No wheezing ,   Walks regularly .   No burning in her lungs with deep breath No cp or pleuretic cp .   Has HLD, Just started back on rosuvastatin    May 30, 2023 Remains referred back to us  for further episodes of chest pain.  Exercises regularly ,  Walks every day , just under a mile a day  She is short of breath at other times  Has shortness of breath when she wakes up in the morning or when she eats .  She coughs frequently , especially at night   Her ECG from her primary MD showed NSR with NS ST abn - no changes from previous ECGs.  Echo from July 2023 showed normal LV function , Mild AS  We had ordered a lexiscan  myoview  in 2023 but it was not done     Past Medical History:  Diagnosis Date   Allergy    Asthma    Bronchitis    Cataract    Diverticulosis    DM2 (diabetes mellitus, type 2) (HCC)    GERD (gastroesophageal reflux disease)    Heart murmur    Hyperplastic colon polyp    Hypertension     Past Surgical History:  Procedure Laterality Date   CESAREAN SECTION     COLONOSCOPY     ESOPHAGOGASTRODUODENOSCOPY     TONSILLECTOMY      Current Medications: Current Meds  Medication Sig   amLODipine  (NORVASC ) 10 MG tablet Take 1 tablet (10 mg total) by mouth daily.   empagliflozin  (JARDIANCE ) 10 MG TABS tablet Take 1 tablet (10 mg total) by mouth daily before breakfast.    glipiZIDE  (GLUCOTROL ) 10 MG tablet TAKE 1 TABLET BY MOUTH ONCE DAILY BEFORE BREAKFAST   Glucagon  (GVOKE HYPOPEN  2-PACK) 0.5 MG/0.1ML SOAJ Inject 0.5 mg into the skin as needed.   hydrochlorothiazide  (HYDRODIURIL ) 25 MG tablet Take 1 tablet (25 mg total) by mouth daily.   HYDROcodone -acetaminophen  (NORCO) 5-325 MG tablet Take 1 tablet by mouth every 6 (six) hours as needed.   metoprolol  succinate (TOPROL -XL) 25 MG 24 hr tablet Take 1 tablet (25 mg total) by mouth daily.   metoprolol  tartrate (LOPRESSOR ) 100 MG tablet Take 1 tablet by mouth two hours prior to scan   pantoprazole  (PROTONIX ) 40 MG tablet Take 1 tablet (40 mg total) by mouth daily.   potassium chloride (KLOR-CON) 10 MEQ tablet Take 1 tablet (10 mEq total) by mouth 2 (two) times daily.   rosuvastatin  (CRESTOR ) 40 MG tablet Take 1 tablet (40 mg total) by mouth daily.   Semaglutide  7 MG TABS Take 1 tablet (7 mg total) by mouth daily.     Allergies:   Lisinopril , Peanut-containing drug products, and Tramadol    Social History   Socioeconomic History   Marital status: Single    Spouse  name: Not on file   Number of children: 1   Years of education: Not on file   Highest education level: Associate degree: occupational, Scientist, product/process development, or vocational program  Occupational History   Occupation: Educator  Tobacco Use   Smoking status: Never   Smokeless tobacco: Never  Vaping Use   Vaping status: Never Used  Substance and Sexual Activity   Alcohol use: No   Drug use: No   Sexual activity: Yes  Other Topics Concern   Not on file  Social History Narrative   She reports she is single. She has 1 daughter. She is an Programmer, systems.  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     2 caffeinated beverages daily.   No alcohol tobacco or drug use.   Social Drivers of Health   Financial Resource Strain: Medium  Risk (04/04/2023)   Overall Financial Resource Strain (CARDIA)    Difficulty of Paying Living Expenses: Somewhat hard  Food Insecurity: Food Insecurity Present (04/04/2023)   Hunger Vital Sign    Worried About Running Out of Food in the Last Year: Sometimes true    Ran Out of Food in the Last Year: Sometimes true  Transportation Needs: No Transportation Needs (04/04/2023)   PRAPARE - Administrator, Civil Service (Medical): No    Lack of Transportation (Non-Medical): No  Physical Activity: Sufficiently Active (04/04/2023)   Exercise Vital Sign    Days of Exercise per Week: 4 days    Minutes of Exercise per Session: 40 min  Recent Concern: Physical Activity - Insufficiently Active (01/04/2023)   Exercise Vital Sign    Days of Exercise per Week: 3 days    Minutes of Exercise per Session: 40 min  Stress: Stress Concern Present (04/04/2023)   [employer] of Occupational Health - Occupational Stress Questionnaire    Feeling of Stress : Rather much  Social Connections: Unknown (04/04/2023)   Social Connection and Isolation Panel [NHANES]    Frequency of Communication with Friends and Family: Once a week    Frequency of Social Gatherings with Friends and Family: Patient declined    Attends Religious Services: More than 4 times per year    Active Member of [employer] or Organizations: Yes    Attends Engineer, structural: More than 4 times per year    Marital Status: Patient declined     Family History: The patient's family history includes Cancer in her father, mother, sister, and sister; Colon cancer in her maternal aunt, maternal uncle, and paternal grandfather; Diabetes in her maternal aunt; Heart disease in her mother; Hyperlipidemia in her mother, sister, and sister; Liver disease in her mother; Stroke in her mother. There is no history of Esophageal cancer, Rectal cancer, or Stomach cancer.  ROS:   Please see the history of present illness.     All other systems reviewed  and are negative.  EKGs/Labs/Other Studies Reviewed:    The following studies were reviewed today:   EKG:      Recent Labs: 04/05/2023: ALT 23; BUN 13; Creatinine, Ser 0.79; Hemoglobin 14.3; Platelets 237.0; Potassium 4.2; Sodium 136  Recent Lipid Panel    Component Value Date/Time   CHOL 181 04/05/2023 0936   CHOL 154 10/31/2019 1010   TRIG 194.0 (H) 04/05/2023 0936   HDL 53.90 04/05/2023 0936   HDL 44 10/31/2019 1010  CHOLHDL 3 04/05/2023 0936   VLDL 38.8 04/05/2023 0936   LDLCALC 88 04/05/2023 0936   LDLCALC 81 10/31/2019 1010   LDLDIRECT 134.0 03/23/2021 0844     Risk Assessment/Calculations:          Physical Exam:    Physical Exam: Blood pressure (!) 144/75, pulse 91, height 5\' 4"  (1.626 m), weight 147 lb 9.6 oz (67 kg), SpO2 97%.  HYPERTENSION CONTROL Vitals:   05/30/23 1614 05/30/23 1702  BP: (!) 156/88 (!) 144/75    The patient's blood pressure is elevated above target today.  In order to address the patient's elevated BP: A new medication was prescribed today.       GEN:  Well nourished, well developed in no acute distress HEENT: Normal NECK: No JVD; No carotid bruits LYMPHATICS: No lymphadenopathy CARDIAC: RRR , soft systolic murmur at LSB  RESPIRATORY:  Clear to auscultation without rales, wheezing or rhonchi  ABDOMEN: Soft, non-tender, non-distended MUSCULOSKELETAL:  No edema; No deformity  SKIN: Warm and dry NEUROLOGIC:  Alert and oriented x 3   ASSESSMENT:    1. Dyspnea on exertion   2. Primary hypertension   3. Medication management     PLAN:       Dyspnea:    2. Murmur:         Medication Adjustments/Labs and Tests Ordered: Current medicines are reviewed at length with the patient today.  Concerns regarding medicines are outlined above.  Orders Placed This Encounter  Procedures   CT CORONARY MORPH W/CTA COR W/SCORE W/CA W/CM &/OR WO/CM   Basic metabolic panel with GFR   Meds ordered this encounter  Medications    metoprolol  tartrate (LOPRESSOR ) 100 MG tablet    Sig: Take 1 tablet by mouth two hours prior to scan    Dispense:  1 tablet    Refill:  0   hydrochlorothiazide  (HYDRODIURIL ) 25 MG tablet    Sig: Take 1 tablet (25 mg total) by mouth daily.    Dispense:  90 tablet    Refill:  3   potassium chloride (KLOR-CON) 10 MEQ tablet    Sig: Take 1 tablet (10 mEq total) by mouth 2 (two) times daily.    Dispense:  180 tablet    Refill:  3     Patient Instructions  Medication Instructions:  START Hydrochlorothiazide  25mg  daily START Potassium Chloride 10mEq twice daily *If you need a refill on your cardiac medications before your next appointment, please call your pharmacy*  Lab Work: BMET in 2 weeks If you have labs (blood work) drawn today and your tests are completely normal, you will receive your results only by: MyChart Message (if you have MyChart) OR A paper copy in the mail If you have any lab test that is abnormal or we need to change your treatment, we will call you to review the results.  Testing/Procedures: Coronary CT Angiogram Non-Cardiac CT Angiography (CTA), is a special type of CT scan that uses a computer to produce multi-dimensional views of major blood vessels throughout the body. In CT angiography, a contrast material is injected through an IV to help visualize the blood vessels  Follow-Up: At [facility], you and your health needs are our priority.  As part of our continuing mission to provide you with exceptional heart care, our providers are all part of one team.  This team includes your primary Cardiologist (physician) and Advanced Practice Providers or APPs (Physician Assistants and Nurse Practitioners) who all work together to provide  you with the care you need, when you need it.  Your next appointment:   4 month(s)  Provider:   APP    Other Instructions   Your cardiac CT will be scheduled at one of the below locations:   [facility] [address] [place], [place] [address] [phone]  OR  [facility] Mom[facility] and Vascular Tower [address]  [place], [place] [address] Opening May 29, 2023  If scheduled at [facility], please arrive at the [facility] and Children's Entrance (Entrance C2) of [facility] 30 minutes prior to test start time. You can use the FREE valet parking offered at entrance C (encouraged to control the heart rate for the test)  Proceed to the [facility] Radiology Department (first floor) to check-in and test prep.   All radiology patients and guests should use entrance C2 at [facility], accessed from [facility], even though the hospital's physical address listed is [address]    If scheduled at the Heart and Vascular Tower at [address], please enter the parking lot using the [address] entrance and use the FREE valet service at the patient drop-off area. Enter the buidling and check-in with registration on the main floor.  Please follow these instructions carefully (unless otherwise directed):  An IV will be required for this test and Nitroglycerin will be given.  Hold all erectile dysfunction medications at least 3 days (72 hrs) prior to test. (Ie viagra, cialis, sildenafil, tadalafil, etc)   On the Night Before the Test: Be sure to Drink plenty of water. Do not consume any caffeinated/decaffeinated beverages or chocolate 12 hours prior to your test. Do not take any antihistamines 12 hours prior to your test.  On the Day of the Test: Drink plenty of water until 1 hour prior to the test. Do not eat any food 1 hour prior to test. You may take your regular medications prior to the test.  Take metoprolol  (Lopressor ) two hours prior to test. If you take Furosemide/Hydrochlorothiazide /Spironolactone/Chlorthalidone, please HOLD on the morning of the test. Patients who wear a continuous glucose monitor MUST remove  the device prior to scanning. FEMALES- please wear underwire-free bra if available, avoid dresses & tight clothing      After the Test: Drink plenty of water. After receiving IV contrast, you may experience a mild flushed feeling. This is normal. On occasion, you may experience a mild rash up to 24 hours after the test. This is not dangerous. If this occurs, you can take Benadryl  25 mg, Zyrtec, Claritin , or Allegra and increase your fluid intake. (Patients taking Tikosyn should avoid Benadryl , and may take Zyrtec, Claritin , or Allegra) If you experience trouble breathing, this can be serious. If it is severe call 911 IMMEDIATELY. If it is mild, please call our office.  We will call to schedule your test 2-4 weeks out understanding that some insurance companies will need an authorization prior to the service being performed.   For more information and frequently asked questions, please visit our website : [email or web address]  For non-scheduling related questions, please contact the cardiac imaging nurse navigator should you have any questions/concerns: Cardiac Imaging Nurse Navigators Direct Office Dial: [phone]   For scheduling needs, including cancellations and rescheduling, please call [place], [phone].    Signed, Ahmad Alert, MD  05/30/2023 6:26 PM    [facility]

## 2023-05-30 NOTE — Patient Instructions (Addendum)
 Medication Instructions:  START Hydrochlorothiazide  25mg  daily START Potassium Chloride 10mEq twice daily *If you need a refill on your cardiac medications before your next appointment, please call your pharmacy*  Lab Work: BMET in 2 weeks If you have labs (blood work) drawn today and your tests are completely normal, you will receive your results only by: MyChart Message (if you have MyChart) OR A paper copy in the mail If you have any lab test that is abnormal or we need to change your treatment, we will call you to review the results.  Testing/Procedures: Coronary CT Angiogram Non-Cardiac CT Angiography (CTA), is a special type of CT scan that uses a computer to produce multi-dimensional views of major blood vessels throughout the body. In CT angiography, a contrast material is injected through an IV to help visualize the blood vessels  Follow-Up: At [facility], you and your health needs are our priority.  As part of our continuing mission to provide you with exceptional heart care, our providers are all part of one team.  This team includes your primary Cardiologist (physician) and Advanced Practice Providers or APPs (Physician Assistants and Nurse Practitioners) who all work together to provide you with the care you need, when you need it.  Your next appointment:   4 month(s)  Provider:   APP    Other Instructions   Your cardiac CT will be scheduled at one of the below locations:   [facility] [address] [place], [place] [address] [phone]  OR  Jeralene Mom. [facility] and Vascular Tower [address]  [place], [place] [address] Opening May 29, 2023  If scheduled at [facility], please arrive at the [facility] and Children's Entrance (Entrance C2) of [facility] 30 minutes prior to test start time. You can use the FREE valet parking offered at entrance C (encouraged to control the heart rate for the test)  Proceed to the  [facility] Radiology Department (first floor) to check-in and test prep.   All radiology patients and guests should use entrance C2 at [facility], accessed from [facility], even though the hospital's physical address listed is [address]    If scheduled at the Heart and Vascular Tower at [address], please enter the parking lot using the [address] entrance and use the FREE valet service at the patient drop-off area. Enter the buidling and check-in with registration on the main floor.  Please follow these instructions carefully (unless otherwise directed):  An IV will be required for this test and Nitroglycerin will be given.  Hold all erectile dysfunction medications at least 3 days (72 hrs) prior to test. (Ie viagra, cialis, sildenafil, tadalafil, etc)   On the Night Before the Test: Be sure to Drink plenty of water. Do not consume any caffeinated/decaffeinated beverages or chocolate 12 hours prior to your test. Do not take any antihistamines 12 hours prior to your test.  On the Day of the Test: Drink plenty of water until 1 hour prior to the test. Do not eat any food 1 hour prior to test. You may take your regular medications prior to the test.  Take metoprolol  (Lopressor ) two hours prior to test. If you take Furosemide/Hydrochlorothiazide /Spironolactone/Chlorthalidone, please HOLD on the morning of the test. Patients who wear a continuous glucose monitor MUST remove the device prior to scanning. FEMALES- please wear underwire-free bra if available, avoid dresses & tight clothing      After the Test:  Drink plenty of water. After receiving IV contrast, you may experience a mild flushed feeling. This is normal. On occasion, you may experience a mild rash up to 24 hours after the test. This is not dangerous. If this occurs, you can take Benadryl  25 mg, Zyrtec, Claritin , or Allegra and increase your fluid intake. (Patients taking Tikosyn  should avoid Benadryl , and may take Zyrtec, Claritin , or Allegra) If you experience trouble breathing, this can be serious. If it is severe call 911 IMMEDIATELY. If it is mild, please call our office.  We will call to schedule your test 2-4 weeks out understanding that some insurance companies will need an authorization prior to the service being performed.   For more information and frequently asked questions, please visit our website : [email or web address]  For non-scheduling related questions, please contact the cardiac imaging nurse navigator should you have any questions/concerns: Cardiac Imaging Nurse Navigators Direct Office Dial: [phone]   For scheduling needs, including cancellations and rescheduling, please call [place], [phone].

## 2023-06-03 ENCOUNTER — Other Ambulatory Visit: Payer: Self-pay | Admitting: Emergency Medicine

## 2023-06-03 DIAGNOSIS — I152 Hypertension secondary to endocrine disorders: Secondary | ICD-10-CM

## 2023-06-05 ENCOUNTER — Telehealth: Payer: Self-pay

## 2023-06-05 MED ORDER — GLIPIZIDE 10 MG PO TABS
10.0000 mg | ORAL_TABLET | Freq: Every day | ORAL | 0 refills | Status: DC
Start: 2023-06-05 — End: 2023-09-10

## 2023-06-05 NOTE — Telephone Encounter (Signed)
 Patient was identified as falling into the [place] Measure - Diabetes.   Patient was: Left voicemail to schedule with primary care provider.

## 2023-06-12 ENCOUNTER — Other Ambulatory Visit: Payer: Self-pay | Admitting: Emergency Medicine

## 2023-06-12 ENCOUNTER — Encounter: Payer: Self-pay | Admitting: Emergency Medicine

## 2023-06-12 DIAGNOSIS — G4700 Insomnia, unspecified: Secondary | ICD-10-CM

## 2023-06-13 ENCOUNTER — Other Ambulatory Visit: Payer: Self-pay | Admitting: Emergency Medicine

## 2023-06-13 DIAGNOSIS — R1013 Epigastric pain: Secondary | ICD-10-CM

## 2023-06-13 MED ORDER — PANTOPRAZOLE SODIUM 40 MG PO TBEC
40.0000 mg | DELAYED_RELEASE_TABLET | Freq: Every day | ORAL | 3 refills | Status: AC
Start: 2023-06-13 — End: ?

## 2023-06-13 NOTE — Telephone Encounter (Signed)
New prescription for pantoprazole sent to pharmacy of record today.  Thanks

## 2023-06-19 MED ORDER — ZOLPIDEM TARTRATE 10 MG PO TABS: 10.0000 mg | ORAL_TABLET | Freq: Every evening | ORAL | 1 refills | Status: AC | PRN

## 2023-09-10 ENCOUNTER — Other Ambulatory Visit: Payer: Self-pay | Admitting: Emergency Medicine

## 2023-09-10 DIAGNOSIS — E1159 Type 2 diabetes mellitus with other circulatory complications: Secondary | ICD-10-CM

## 2023-09-26 ENCOUNTER — Other Ambulatory Visit: Payer: Self-pay

## 2023-09-26 ENCOUNTER — Emergency Department (HOSPITAL_BASED_OUTPATIENT_CLINIC_OR_DEPARTMENT_OTHER)

## 2023-09-26 ENCOUNTER — Emergency Department (HOSPITAL_BASED_OUTPATIENT_CLINIC_OR_DEPARTMENT_OTHER)
Admission: EM | Admit: 2023-09-26 | Discharge: 2023-09-26 | Disposition: A | Discharge status: Home / Self Care | Attending: Emergency Medicine | Admitting: Emergency Medicine

## 2023-09-26 DIAGNOSIS — R040 Epistaxis: Secondary | ICD-10-CM | POA: Insufficient documentation

## 2023-09-26 DIAGNOSIS — R0789 Other chest pain: Secondary | ICD-10-CM | POA: Insufficient documentation

## 2023-09-26 LAB — CBC WITH DIFFERENTIAL/PLATELET
Abs Immature Granulocytes: 0.01 K/uL (ref 0.00–0.07)
Basophils Absolute: 0 K/uL (ref 0.0–0.1)
Basophils Relative: 1 %
Eosinophils Absolute: 0.1 K/uL (ref 0.0–0.5)
Eosinophils Relative: 3 %
HCT: 40.5 % (ref 36.0–46.0)
Hemoglobin: 13.2 g/dL (ref 12.0–15.0)
Immature Granulocytes: 0 %
Lymphocytes Relative: 36 %
Lymphs Abs: 1.4 K/uL (ref 0.7–4.0)
MCH: 28 pg (ref 26.0–34.0)
MCHC: 32.6 g/dL (ref 30.0–36.0)
MCV: 85.8 fL (ref 80.0–100.0)
Monocytes Absolute: 0.4 K/uL (ref 0.1–1.0)
Monocytes Relative: 10 %
Neutro Abs: 1.9 K/uL (ref 1.7–7.7)
Neutrophils Relative %: 50 %
Platelets: 200 K/uL (ref 150–400)
RBC: 4.72 MIL/uL (ref 3.87–5.11)
RDW: 12.9 % (ref 11.5–15.5)
WBC: 3.9 K/uL — ABNORMAL LOW (ref 4.0–10.5)
nRBC: 0 % (ref 0.0–0.2)

## 2023-09-26 LAB — BASIC METABOLIC PANEL WITH GFR
Anion gap: 12 (ref 5–15)
BUN: 14 mg/dL (ref 8–23)
CO2: 22 mmol/L (ref 22–32)
Calcium: 10 mg/dL (ref 8.9–10.3)
Chloride: 102 mmol/L (ref 98–111)
Creatinine, Ser: 0.79 mg/dL (ref 0.44–1.00)
GFR, Estimated: >60 mL/min (ref >60)
Glucose, Bld: 220 mg/dL — ABNORMAL HIGH (ref 70–99)
Potassium: 4 mmol/L (ref 3.5–5.1)
Sodium: 136 mmol/L (ref 135–145)

## 2023-09-26 LAB — TROPONIN T, HIGH SENSITIVITY
Troponin T High Sensitivity: <15 ng/L (ref 0–19)
Troponin T High Sensitivity: <15 ng/L (ref 0–19)

## 2023-09-26 MED ORDER — TRANEXAMIC ACID 1000 MG/10ML IV SOLN
500.0000 mg | Freq: Once | INTRAVENOUS | Status: AC
  Administered 2023-09-26: 500 mg via TOPICAL
  Filled 2023-09-26: qty 10

## 2023-09-26 MED ORDER — OXYMETAZOLINE HCL 0.05 % NA SOLN
1.0000 | Freq: Once | NASAL | Status: AC
  Administered 2023-09-26: 1 via NASAL
  Filled 2023-09-26: qty 30

## 2023-09-26 NOTE — ED Provider Notes (Signed)
 [facility] EMERGENCY DEPARTMENT AT [facility] Provider Note   CSN: [id number] Arrival date & time: 09/26/23  0006     Patient presents with: Epistaxis   Erin Benitez is a 73 y.o. female.   Spontaneous nosebleed from right nare onset 11 PM while she was going to his bed.  No trauma.  No history of nosebleeds.  No blood thinner use.  While she was waiting she developed some central chest pain which is since resolved.  Lasted for several hours and is now resolved.  Some associated shortness of breath.  Denies any cardiac history.  Has had similar pain in the past without clear diagnosis. Not exertional or pleuritic.  No chest pain currently  Cardiology has scheduled a CT coronary study which she has not yet completed.  The history is provided by the patient and a relative.  Epistaxis Associated symptoms: no congestion, no dizziness, no fever and no headaches        Prior to Admission medications   Medication Sig Start Date End Date Taking? Authorizing Provider  amLODipine  (NORVASC ) 10 MG tablet Take 1 tablet (10 mg total) by mouth daily. 01/09/23   [name], MD  empagliflozin  (JARDIANCE ) 10 MG TABS tablet Take 1 tablet (10 mg total) by mouth daily before breakfast. 04/05/23   [name], MD  glipiZIDE  (GLUCOTROL ) 10 MG tablet TAKE 1 TABLET BY MOUTH ONCE DAILY BEFORE BREAKFAST 09/10/23   Sagardia, Emil Schanz, MD  Glucagon  (GVOKE HYPOPEN  2-PACK) 0.5 MG/0.1ML SOAJ Inject 0.5 mg into the skin as needed. 10/06/22   Sagardia, Miguel Jose, MD  hydrochlorothiazide  (HYDRODIURIL ) 25 MG tablet Take 1 tablet (25 mg total) by mouth daily. 05/30/23 08/28/23  [name], [name], MD  HYDROcodone -acetaminophen  (NORCO) 5-325 MG tablet Take 1 tablet by mouth every 6 (six) hours as needed. 01/19/23   Agarwala, Gildardo, MD  metoprolol  succinate (TOPROL -XL) 25 MG 24 hr tablet Take 1 tablet (25 mg total) by mouth daily. 10/06/22   [name], MD  metoprolol  tartrate  (LOPRESSOR ) 100 MG tablet Take 1 tablet by mouth two hours prior to scan 05/30/23   [name], [name], MD  pantoprazole  (PROTONIX ) 40 MG tablet Take 1 tablet (40 mg total) by mouth daily. 06/13/23   [name], MD  potassium chloride  (KLOR-CON ) 10 MEQ tablet Take 1 tablet (10 mEq total) by mouth 2 (two) times daily. 05/30/23   [name], [name], MD  rosuvastatin  (CRESTOR ) 40 MG tablet Take 1 tablet (40 mg total) by mouth daily. 10/06/22   Sagardia, Miguel Jose, MD  Semaglutide  7 MG TABS Take 1 tablet (7 mg total) by mouth daily. 10/06/22   Sagardia, Miguel Jose, MD  zolpidem  (AMBIEN ) 10 MG tablet Take 1 tablet (10 mg total) by mouth at bedtime as needed for sleep. 06/19/23 07/19/23  [name], MD    Allergies: Lisinopril , Peanut-containing drug products, and Tramadol     Review of Systems  Constitutional:  Negative for activity change, appetite change and fever.  HENT:  Positive for nosebleeds. Negative for congestion.   Respiratory:  Positive for chest tightness and shortness of breath.   Cardiovascular:  Positive for chest pain.  Gastrointestinal:  Negative for abdominal pain, nausea and vomiting.  Genitourinary:  Negative for dysuria.  Musculoskeletal:  Negative for arthralgias and myalgias.  Skin:  Negative for rash.  Neurological:  Negative for dizziness, weakness and headaches.   all other systems are negative except as noted in the HPI and PMH.  Updated Vital Signs BP 136/81   Pulse 82   Temp 98.1 F (36.7 C) (Temporal)   Resp 17   Ht 5' 4 (1.626 m)   Wt 70.3 kg   SpO2 98%   BMI 26.61 kg/m   Physical Exam Vitals and nursing note reviewed.  Constitutional:      General: She is not in acute distress.    Appearance: She is well-developed.  HENT:     Head: Normocephalic and atraumatic.     Nose:     Comments: Clot to right nare without active bleeding.  No blood in the posterior pharynx Questionable small nonbleeding vessel to the septum    Mouth/Throat:      Pharynx: No oropharyngeal exudate.  Eyes:     Conjunctiva/sclera: Conjunctivae normal.     Pupils: Pupils are equal, round, and reactive to light.  Neck:     Comments: No meningismus. Cardiovascular:     Rate and Rhythm: Normal rate and regular rhythm.     Heart sounds: Normal heart sounds. No murmur heard. Pulmonary:     Effort: Pulmonary effort is normal. No respiratory distress.     Breath sounds: Normal breath sounds.  Abdominal:     Palpations: Abdomen is soft.     Tenderness: There is no abdominal tenderness. There is no guarding or rebound.  Musculoskeletal:        General: No tenderness. Normal range of motion.     Cervical back: Normal range of motion and neck supple.  Skin:    General: Skin is warm.  Neurological:     Mental Status: She is alert and oriented to person, place, and time.     Cranial Nerves: No cranial nerve deficit.     Motor: No abnormal muscle tone.     Coordination: Coordination normal.     Comments:  5/5 strength throughout. CN 2-12 intact.Equal grip strength.   Psychiatric:        Behavior: Behavior normal.     (all labs ordered are listed, but only abnormal results are displayed) Labs Reviewed  CBC WITH DIFFERENTIAL/PLATELET - Abnormal; Notable for the following components:      Result Value   WBC 3.9 (*)    All other components within normal limits  BASIC METABOLIC PANEL WITH GFR - Abnormal; Notable for the following components:   Glucose, Bld 220 (*)    All other components within normal limits  TROPONIN T, HIGH SENSITIVITY  TROPONIN T, HIGH SENSITIVITY    EKG: EKG Interpretation Date/Time:  Tuesday September 26 2023 04:37:18 EDT Ventricular Rate:  65 PR Interval:  133 QRS Duration:  92 QT Interval:  380 QTC Calculation: 396 R Axis:   48  Text Interpretation: Sinus rhythm Abnormal R-wave progression, early transition No significant change was found Confirmed by Carita Senior [phone]) on 09/26/2023 5:04:57 AM  Radiology: [facility]  Chest Port 1 View Result Date: 09/26/2023 CLINICAL DATA:  Epistaxis EXAM: PORTABLE CHEST 1 VIEW COMPARISON:  04/05/2023 FINDINGS: The heart size and mediastinal contours are within normal limits. Both lungs are clear. The visualized skeletal structures are unremarkable. IMPRESSION: No acute abnormality of the lungs in AP portable projection. Electronically Signed   By: Marolyn JONETTA Jaksch M.D.   On: 09/26/2023 06:39     Epistaxis Management  Date/Time: 09/26/2023 5:05 AM  Performed by: Carita Senior, MD Authorized by: Carita Senior, MD   Consent:    Consent obtained:  Verbal   Consent given by:  Patient   Risks, benefits,  and alternatives were discussed: yes     Risks discussed:  Bleeding, infection, pain and nasal injury   Alternatives discussed:  No treatment Universal protocol:    Procedure explained and questions answered to patient or proxy's satisfaction: yes     Relevant documents present and verified: yes     Immediately prior to procedure, a time out was called: yes     Patient identity confirmed:  Verbally with patient Anesthesia:    Anesthesia method:  Topical application   Topical anesthetic:  Lidocaine  gel Procedure details:    Treatment site:  R anterior   Treatment method:  Thrombin, silver nitrate and anterior pack   Treatment complexity:  Limited   Treatment episode: recurring   Post-procedure details:    Procedure completion:  Tolerated    Medications Ordered in the ED  tranexamic acid  (CYKLOKAPRON ) injection 500 mg (has no administration in time range)  oxymetazoline  (AFRIN) 0.05 % nasal spray 1 spray (1 spray Each Nare Given 09/26/23 0418)                                    Medical Decision Making Amount and/or Complexity of Data Reviewed Labs: ordered. Decision-making details documented in ED Course. Radiology: ordered and independent interpretation performed. Decision-making details documented in ED Course. ECG/medicine tests: ordered and independent  interpretation performed. Decision-making details documented in ED Course.  Risk OTC drugs. Prescription drug management.   Spontaneous nosebleed, stable vitals, no anticoagulation use Episode of chest pain now resolved  Bleeding controlled with topical TXA and Afrin and lidocaine  with epinephrine.  EKG is sinus rhythm without acute ST changes.  Troponin is negative.  Patient's chest pain has resolved.  She has been scheduled for CT coronary study by cardiology but has not yet completed it. She is encouraged to do this.  Her troponin is negative x 2.  Discussed she is low risk for heart disease but nonzero risk.  Recommend outpatient follow-up with PCP as well as cardiology and ENT as needed.  Return to the ED if bleeding persists after 30 minutes of holding constant pressure to her nose.  Return to the ED with exertional chest pain, pain associated shortness of breath, nausea, vomiting, sweating or other concerns.      Final diagnoses:  None    ED Discharge Orders     None          Oreatha Fabry, Garnette, MD 09/26/23 [phone]

## 2023-09-26 NOTE — Discharge Instructions (Addendum)
 Follow-up with the ear nose and throat doctor.  If bleeding recurs, hold pressure for 20 minutes and then come to the ED if bleeding persists.  It looks like this cardiologist was going to schedule a CT coronary study which you should still try to schedule.  Return to the ED with exertional chest pain, pain associate with shortness of breath, nausea, vomiting, sweating or any concerns.

## 2023-09-26 NOTE — ED Triage Notes (Signed)
 Pt POV reporting nosebleed that began about 45 min ago, still actively bleeding from R nostril.

## 2023-09-29 ENCOUNTER — Telehealth: Payer: Self-pay

## 2023-09-29 ENCOUNTER — Ambulatory Visit: Admitting: General Practice

## 2023-09-29 NOTE — Telephone Encounter (Signed)
 Patient was identified as falling into the [place] Measure - Diabetes.   Patient was: Patient refuses intervention:  No reason given/other. Patient states she was just in the hospital, has a lot going on right now. She does not know when she will be able to schedule, will call us  back when available.

## 2023-10-04 ENCOUNTER — Other Ambulatory Visit: Payer: Self-pay | Admitting: Emergency Medicine

## 2023-10-04 DIAGNOSIS — E782 Mixed hyperlipidemia: Secondary | ICD-10-CM

## 2023-10-04 DIAGNOSIS — E1159 Type 2 diabetes mellitus with other circulatory complications: Secondary | ICD-10-CM

## 2023-10-13 ENCOUNTER — Ambulatory Visit

## 2023-10-17 ENCOUNTER — Ambulatory Visit (INDEPENDENT_AMBULATORY_CARE_PROVIDER_SITE_OTHER)

## 2023-10-17 VITALS — Ht 64.0 in | Wt 155.0 lb

## 2023-10-17 DIAGNOSIS — Z0000 Encounter for general adult medical examination without abnormal findings: Secondary | ICD-10-CM | POA: Diagnosis not present

## 2023-10-17 NOTE — Progress Notes (Signed)
 Subjective:   Erin Benitez is a 73 y.o. who presents for a Medicare Wellness preventive visit.  As a reminder, Annual Wellness Visits don't include a physical exam, and some assessments may be limited, especially if this visit is performed virtually. We may recommend an in-person follow-up visit with your provider if needed.  Visit Complete: Virtual I connected with  Erin Benitez on 10/17/23 by a audio enabled telemedicine application and verified that I am speaking with the correct person using two identifiers.  Patient Location: Home  Provider Location: Office/Clinic  I discussed the limitations of evaluation and management by telemedicine. The patient expressed understanding and agreed to proceed.  Vital Signs: Because this visit was a virtual/telehealth visit, some criteria may be missing or patient reported. Any vitals not documented were not able to be obtained and vitals that have been documented are patient reported.  VideoDeclined- This patient declined Librarian, academic. Therefore the visit was completed with audio only.  Persons Participating in Visit: Patient.  AWV Questionnaire: No: Patient Medicare AWV questionnaire was not completed prior to this visit.        Objective:    Today's Vitals   10/17/23 1302  Weight: 155 lb (70.3 kg)  Height: 5' 4 (1.626 m)   Body mass index is 26.61 kg/m.     10/17/2023    1:02 PM 09/26/2023   12:15 AM 12/12/2022    9:10 AM 11/02/2020    8:52 AM 09/01/2020    9:00 AM 07/23/2019    9:02 AM 07/18/2018   11:40 AM  Advanced Directives  Does Patient Have a Medical Advance Directive? No No No No No No No  Does patient want to make changes to medical advance directive?      Yes (ED - Information included in AVS)   Would patient like information on creating a medical advance directive? No - Patient declined  No - Patient declined Yes (MAU/Ambulatory/Procedural Areas - Information given) Yes  (MAU/Ambulatory/Procedural Areas - Information given) No - Patient declined No - Patient declined      Data saved with a previous flowsheet row definition    Current Medications (verified) Outpatient Encounter Medications as of 10/17/2023  Medication Sig   amLODipine  (NORVASC ) 10 MG tablet Take 1 tablet (10 mg total) by mouth daily.   diphenhydramine -acetaminophen  (TYLENOL  PM) 25-500 MG TABS tablet Take 1 tablet by mouth at bedtime as needed (prn for sleep).   empagliflozin  (JARDIANCE ) 10 MG TABS tablet Take 1 tablet (10 mg total) by mouth daily before breakfast.   glipiZIDE  (GLUCOTROL ) 10 MG tablet TAKE 1 TABLET BY MOUTH ONCE DAILY BEFORE BREAKFAST   Glucagon  (GVOKE HYPOPEN  2-PACK) 0.5 MG/0.1ML SOAJ Inject 0.5 mg into the skin as needed.   hydrochlorothiazide  (HYDRODIURIL ) 25 MG tablet Take 1 tablet (25 mg total) by mouth daily.   HYDROcodone -acetaminophen  (NORCO) 5-325 MG tablet Take 1 tablet by mouth every 6 (six) hours as needed.   metoprolol  succinate (TOPROL -XL) 25 MG 24 hr tablet Take 1 tablet by mouth once daily   metoprolol  tartrate (LOPRESSOR ) 100 MG tablet Take 1 tablet by mouth two hours prior to scan   pantoprazole  (PROTONIX ) 40 MG tablet Take 1 tablet (40 mg total) by mouth daily.   potassium chloride  (KLOR-CON ) 10 MEQ tablet Take 1 tablet (10 mEq total) by mouth 2 (two) times daily.   rosuvastatin  (CRESTOR ) 40 MG tablet Take 1 tablet by mouth once daily   Semaglutide  7 MG TABS Take 1 tablet (  7 mg total) by mouth daily.   zolpidem  (AMBIEN ) 10 MG tablet Take 1 tablet (10 mg total) by mouth at bedtime as needed for sleep.   No facility-administered encounter medications on file as of 10/17/2023.    Allergies (verified) Lisinopril , Peanut-containing drug products, and Tramadol    History: Past Medical History:  Diagnosis Date   Allergy    Asthma    Bronchitis    Cataract    Diverticulosis    DM2 (diabetes mellitus, type 2) (HCC)    GERD (gastroesophageal reflux disease)     Heart murmur    Hyperplastic colon polyp    Hypertension    Past Surgical History:  Procedure Laterality Date   CESAREAN SECTION     COLONOSCOPY     ESOPHAGOGASTRODUODENOSCOPY     TONSILLECTOMY     Family History  Problem Relation Age of Onset   Cancer Mother        ovarian cancer   Heart disease Mother    Liver disease Mother    Hyperlipidemia Mother    Stroke Mother    Cancer Father        lung cancer   Cancer Sister    Hyperlipidemia Sister    Cancer Sister    Hyperlipidemia Sister    Diabetes Maternal Aunt    Colon cancer Maternal Aunt        x2 aunts   Colon cancer Maternal Uncle        2 uncles   Colon cancer Paternal Grandfather    Esophageal cancer Neg Hx    Rectal cancer Neg Hx    Stomach cancer Neg Hx    Social History   Socioeconomic History   Marital status: Single    Spouse name: Not on file   Number of children: 1   Years of education: Not on file   Highest education level: Associate degree: occupational, Scientist, product/process development, or vocational program  Occupational History   Occupation: Programmer, systems  Tobacco Use   Smoking status: Never   Smokeless tobacco: Never  Vaping Use   Vaping status: Never Used  Substance and Sexual Activity   Alcohol use: No   Drug use: No   Sexual activity: Yes  Other Topics Concern   Not on file  Social History Narrative   She reports she is single. She has 1 daughter. She is an Programmer, systems.  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     2 caffeinated beverages daily.   No alcohol tobacco or drug use.   Social Drivers of Health   Financial Resource Strain: High Risk (10/17/2023)   Overall Financial Resource Strain (CARDIA)    Difficulty of Paying Living Expenses: Hard  Food Insecurity: Food Insecurity Present (10/17/2023)   Hunger Vital Sign    Worried About Running Out of Food in the Last  Year: Sometimes true    Ran Out of Food in the Last Year: Sometimes true  Transportation Needs: Patient Declined (10/17/2023)   PRAPARE - Transportation    Lack of Transportation (Medical): Patient declined    Lack of Transportation (Non-Medical): Patient declined  Physical Activity: Sufficiently Active (10/17/2023)   Exercise Vital Sign    Days of Exercise per Week: 5 days    Minutes of Exercise per Session: 60 min  Stress: Stress Concern Present (10/17/2023)   [employer] of Occupational Health - Occupational Stress  Questionnaire    Feeling of Stress: Rather much  Social Connections: Socially Integrated (10/17/2023)   Social Connection and Isolation Panel    Frequency of Communication with Friends and Family: Twice a week    Frequency of Social Gatherings with Friends and Family: Once a week    Attends Religious Services: More than 4 times per year    Active Member of [employer] or Organizations: Yes    Attends Banker Meetings: 1 to 4 times per year    Marital Status: Married    Tobacco Counseling Counseling given: No    Clinical Intake:  Pre-visit preparation completed: Yes  Pain : No/denies pain     BMI - recorded: 26.61 Nutritional Status: BMI 25 -29 Overweight Nutritional Risks: None Diabetes: Yes CBG done?: No Did pt. bring in CBG monitor from home?: No  Lab Results  Component Value Date   HGBA1C 10.4 (A) 04/05/2023   HGBA1C 7.3 (A) 01/05/2023   HGBA1C 8.8 (H) 11/04/2022     How often do you need to have someone help you when you read instructions, pamphlets, or other written materials from your doctor or pharmacy?: 1 - Never  Interpreter Needed?: No  Information entered by :: Erin Benitez, CMA   Activities of Daily Living     10/16/2023    9:30 PM 11/04/2022    8:31 PM  In your present state of health, do you have any difficulty performing the following activities:  Hearing? 0 0  Vision? 0 1  Difficulty concentrating or making  decisions? 0 0  Walking or climbing stairs? 0 0  Dressing or bathing? 0 0  Doing errands, shopping? 0 0  Preparing Food and eating ? N N  Using the Toilet? N N  In the past six months, have you accidently leaked urine? N N  Do you have problems with loss of bowel control? N N  Managing your Medications? N Y  Managing your Finances? N N  Housekeeping or managing your Housekeeping? N N    Patient Care Team: [name], MD as PCP - General (Internal Medicine) [name], Aleene PARAS, MD (Inactive) as PCP - Cardiology (Cardiology) Cleotilde Sewer, OD (Optometry)  I have updated your Care Teams any recent Medical Services you may have received from other providers in the past year.     Assessment:   This is a routine wellness examination for Erin Benitez.  Hearing/Vision screen Hearing Screening - Comments:: Denies hearing difficulties   Vision Screening - Comments:: Appt w/[facility] in 10/2023 for a Diabetic eye exam   Goals Addressed               This Visit's Progress     Patient Stated (pt-stated)        Patient state she is trying to maintain her bp and sugar levels       Depression Screen     10/17/2023    1:06 PM 04/05/2023    8:25 AM 01/05/2023    8:15 AM 10/06/2022    8:12 AM 07/06/2022    8:05 AM 01/05/2022    8:07 AM 10/06/2021    9:33 AM  PHQ 2/9 Scores  PHQ - 2 Score 0 0 0 0 0 0 0  PHQ- 9 Score 0          Fall Risk     10/17/2023    1:06 PM 10/16/2023    9:30 PM 04/05/2023    8:25 AM 01/05/2023    8:15 AM  11/04/2022    8:31 PM  Fall Risk   Falls in the past year? 0 0 0 0 0  Number falls in past yr: 0 0 0 0   Injury with Fall? 0 0 0 0   Risk for fall due to : No Fall Risks  No Fall Risks No Fall Risks   Follow up Falls evaluation completed;Falls prevention discussed  Falls evaluation completed Falls evaluation completed     MEDICARE RISK AT HOME:  Medicare Risk at Home Any stairs in or around the home?: No If so, are there any without handrails?: No Home  free of loose throw rugs in walkways, pet beds, electrical cords, etc?: Yes Adequate lighting in your home to reduce risk of falls?: Yes Life alert?: No Use of a cane, walker or w/c?: No Grab bars in the bathroom?: Yes Shower chair or bench in shower?: Yes Elevated toilet seat or a handicapped toilet?: No  TIMED UP AND GO:  Was the test performed?  No  Cognitive Function: 6CIT completed        10/17/2023    1:24 PM 07/23/2019    9:03 AM 07/18/2018   11:43 AM 09/30/2017    8:09 AM  6CIT Screen  What Year? 0 points 0 points 0 points 0 points  What month? 0 points 0 points 0 points 0 points  What time? 0 points 0 points 0 points 0 points  Count back from 20 0 points 0 points 0 points 0 points  Months in reverse 0 points 0 points 0 points 0 points  Repeat phrase 0 points 0 points 0 points 0 points  Total Score 0 points 0 points 0 points 0 points    Immunizations Immunization History  Administered Date(s) Administered   Fluad Quad(high Dose 65+) 10/23/2018, 10/31/2019, 01/06/2021   Fluad Trivalent(High Dose 65+) 10/06/2022   INFLUENZA, HIGH DOSE SEASONAL PF 10/30/2019   Influenza,inj,Quad PF,6+ Mos 03/09/2015   PFIZER(Purple Top)SARS-COV-2 Vaccination 04/20/2019, 05/04/2019   PPD Test 04/01/2013, 09/30/2017, 06/17/2020   Pneumococcal Conjugate-13 07/08/2017   Pneumococcal Polysaccharide-23 08/21/2018   Td 07/08/2017   Zoster Recombinant(Shingrix) 07/07/2020, 01/06/2021    Screening Tests Health Maintenance  Topic Date Due   COVID-19 Vaccine (3 - Pfizer risk series) 06/01/2019   FOOT EXAM  07/18/2019   Diabetic kidney evaluation - Urine ACR  07/29/2020   OPHTHALMOLOGY EXAM  10/24/2020   Influenza Vaccine  09/01/2023   HEMOGLOBIN A1C  10/06/2023   Colonoscopy  06/20/2024   Diabetic kidney evaluation - eGFR measurement  09/25/2024   Medicare Annual Wellness (AWV)  10/16/2024   Mammogram  04/12/2025   DTaP/Tdap/Td (2 - Tdap) 07/09/2027   Pneumococcal Vaccine: 50+ Years   Completed   DEXA SCAN  Completed   Hepatitis C Screening  Completed   Zoster Vaccines- Shingrix  Completed   HPV VACCINES  Aged Out   Meningococcal B Vaccine  Aged Out    Health Maintenance Items Addressed:  10/17/2023  Additional Screening:  Vision Screening: Recommended annual ophthalmology exams for early detection of glaucoma and other disorders of the eye. Is the patient up to date with their annual eye exam?  No  Who is the provider or what is the name of the office in which the patient attends annual eye exams? Appt w/Sally Cleotilde of [facility] in 10/2023 for a Diabetic eye exam.  Dental Screening: Recommended annual dental exams for proper oral hygiene  Community Resource Referral / Chronic Care Management: CRR required this visit?  No   CCM required this visit?  No   Plan:    I have personally reviewed and noted the following in the patient's chart:   Medical and social history Use of alcohol, tobacco or illicit drugs  Current medications and supplements including opioid prescriptions. Patient is not currently taking opioid prescriptions. Functional ability and status Nutritional status Physical activity Advanced directives List of other physicians Hospitalizations, surgeries, and ER visits in previous 12 months Vitals Screenings to include cognitive, depression, and falls Referrals and appointments  In addition, I have reviewed and discussed with patient certain preventive protocols, quality metrics, and best practice recommendations. A written personalized care plan for preventive services as well as general preventive health recommendations were provided to patient.   Erin CHRISTELLA Benitez, CMA   10/17/2023   After Visit Summary: (MyChart) Due to this being a telephonic visit, the after visit summary with patients personalized plan was offered to patient via MyChart   Notes: Scheduled a Diabetes f/u appt for the pt for 10/26/2023

## 2023-10-17 NOTE — Patient Instructions (Addendum)
 Ms. Arrambide,  Thank you for taking the time for your Medicare Wellness Visit. I appreciate your continued commitment to your health goals. Please review the care plan we discussed, and feel free to reach out if I can assist you further.  Medicare recommends these wellness visits once per year to help you and your care team stay ahead of potential health issues. These visits are designed to focus on prevention, allowing your provider to concentrate on managing your acute and chronic conditions during your regular appointments.  Please note that Annual Wellness Visits do not include a physical exam. Some assessments may be limited, especially if the visit was conducted virtually. If needed, we may recommend a separate in-person follow-up with your provider.  Ongoing Care Seeing your primary care provider every 3 to 6 months helps us  monitor your health and provide consistent, personalized care.   Referrals If a referral was made during today's visit and you haven't received any updates within two weeks, please contact the referred provider directly to check on the status.  Recommended Screenings:  Health Maintenance  Topic Date Due   COVID-19 Vaccine (3 - Pfizer risk series) 06/01/2019   Complete foot exam   07/18/2019   Yearly kidney health urinalysis for diabetes  07/29/2020   Eye exam for diabetics  10/24/2020   Flu Shot  09/01/2023   Hemoglobin A1C  10/06/2023   Colon Cancer Screening  06/20/2024   Yearly kidney function blood test for diabetes  09/25/2024   Medicare Annual Wellness Visit  10/16/2024   Breast Cancer Screening  04/12/2025   DTaP/Tdap/Td vaccine (2 - Tdap) 07/09/2027   Pneumococcal Vaccine for age over 55  Completed   DEXA scan (bone density measurement)  Completed   Hepatitis C Screening  Completed   Zoster (Shingles) Vaccine  Completed   HPV Vaccine  Aged Out   Meningitis B Vaccine  Aged Out       10/17/2023    1:02 PM  Advanced Directives  Does Patient Have  a Medical Advance Directive? No  Would patient like information on creating a medical advance directive? No - Patient declined   Advance Care Planning is important because it: Ensures you receive medical care that aligns with your values, goals, and preferences. Provides guidance to your family and loved ones, reducing the emotional burden of decision-making during critical moments.  Vision: Annual vision screenings are recommended for early detection of glaucoma, cataracts, and diabetic retinopathy. These exams can also reveal signs of chronic conditions such as diabetes and high blood pressure.  Dental: Annual dental screenings help detect early signs of oral cancer, gum disease, and other conditions linked to overall health, including heart disease and diabetes.

## 2023-10-18 NOTE — Progress Notes (Signed)
 Subjective:   Erin Benitez is a 73 y.o. who presents for a Medicare Wellness preventive visit.  As a reminder, Annual Wellness Visits don't include a physical exam, and some assessments may be limited, especially if this visit is performed virtually. We may recommend an in-person follow-up visit with your provider if needed.  Visit Complete: Virtual I connected with  Mikel JINNY Public on 10/18/23 by a audio enabled telemedicine application and verified that I am speaking with the correct person using two identifiers.  Patient Location: Home  Provider Location: Office/Clinic  I discussed the limitations of evaluation and management by telemedicine. The patient expressed understanding and agreed to proceed.  Vital Signs: Because this visit was a virtual/telehealth visit, some criteria may be missing or patient reported. Any vitals not documented were not able to be obtained and vitals that have been documented are patient reported.  VideoDeclined- This patient declined Librarian, academic. Therefore the visit was completed with audio only.  Persons Participating in Visit: Patient.  AWV Questionnaire: Yes: Patient Medicare AWV questionnaire was completed on 10/16/2023 prior to this visit.        Objective:    Today's Vitals   10/17/23 1302  Weight: 155 lb (70.3 kg)  Height: 5' 4 (1.626 m)   Body mass index is 26.61 kg/m.     10/17/2023    1:02 PM 09/26/2023   12:15 AM 12/12/2022    9:10 AM 11/02/2020    8:52 AM 09/01/2020    9:00 AM 07/23/2019    9:02 AM 07/18/2018   11:40 AM  Advanced Directives  Does Patient Have a Medical Advance Directive? No No No No No No No  Does patient want to make changes to medical advance directive?      Yes (ED - Information included in AVS)   Would patient like information on creating a medical advance directive? No - Patient declined  No - Patient declined Yes (MAU/Ambulatory/Procedural Areas - Information given) Yes  (MAU/Ambulatory/Procedural Areas - Information given) No - Patient declined No - Patient declined      Data saved with a previous flowsheet row definition    Current Medications (verified) Outpatient Encounter Medications as of 10/17/2023  Medication Sig   amLODipine  (NORVASC ) 10 MG tablet Take 1 tablet (10 mg total) by mouth daily.   diphenhydramine -acetaminophen  (TYLENOL  PM) 25-500 MG TABS tablet Take 1 tablet by mouth at bedtime as needed (prn for sleep).   empagliflozin  (JARDIANCE ) 10 MG TABS tablet Take 1 tablet (10 mg total) by mouth daily before breakfast.   glipiZIDE  (GLUCOTROL ) 10 MG tablet TAKE 1 TABLET BY MOUTH ONCE DAILY BEFORE BREAKFAST   Glucagon  (GVOKE HYPOPEN  2-PACK) 0.5 MG/0.1ML SOAJ Inject 0.5 mg into the skin as needed.   hydrochlorothiazide  (HYDRODIURIL ) 25 MG tablet Take 1 tablet (25 mg total) by mouth daily.   HYDROcodone -acetaminophen  (NORCO) 5-325 MG tablet Take 1 tablet by mouth every 6 (six) hours as needed.   metoprolol  succinate (TOPROL -XL) 25 MG 24 hr tablet Take 1 tablet by mouth once daily   metoprolol  tartrate (LOPRESSOR ) 100 MG tablet Take 1 tablet by mouth two hours prior to scan   pantoprazole  (PROTONIX ) 40 MG tablet Take 1 tablet (40 mg total) by mouth daily.   potassium chloride  (KLOR-CON ) 10 MEQ tablet Take 1 tablet (10 mEq total) by mouth 2 (two) times daily.   rosuvastatin  (CRESTOR ) 40 MG tablet Take 1 tablet by mouth once daily   Semaglutide  7 MG TABS Take 1  tablet (7 mg total) by mouth daily.   zolpidem  (AMBIEN ) 10 MG tablet Take 1 tablet (10 mg total) by mouth at bedtime as needed for sleep.   No facility-administered encounter medications on file as of 10/17/2023.    Allergies (verified) Lisinopril , Peanut-containing drug products, and Tramadol    History: Past Medical History:  Diagnosis Date   Allergy    Asthma    Bronchitis    Cataract    Diverticulosis    DM2 (diabetes mellitus, type 2) (HCC)    GERD (gastroesophageal reflux disease)     Heart murmur    Hyperplastic colon polyp    Hypertension    Past Surgical History:  Procedure Laterality Date   CESAREAN SECTION     COLONOSCOPY     ESOPHAGOGASTRODUODENOSCOPY     TONSILLECTOMY     Family History  Problem Relation Age of Onset   Cancer Mother        ovarian cancer   Heart disease Mother    Liver disease Mother    Hyperlipidemia Mother    Stroke Mother    Cancer Father        lung cancer   Cancer Sister    Hyperlipidemia Sister    Cancer Sister    Hyperlipidemia Sister    Diabetes Maternal Aunt    Colon cancer Maternal Aunt        x2 aunts   Colon cancer Maternal Uncle        2 uncles   Colon cancer Paternal Grandfather    Esophageal cancer Neg Hx    Rectal cancer Neg Hx    Stomach cancer Neg Hx    Social History   Socioeconomic History   Marital status: Single    Spouse name: Not on file   Number of children: 1   Years of education: Not on file   Highest education level: Associate degree: occupational, Scientist, product/process development, or vocational program  Occupational History   Occupation: Programmer, systems  Tobacco Use   Smoking status: Never   Smokeless tobacco: Never  Vaping Use   Vaping status: Never Used  Substance and Sexual Activity   Alcohol use: No   Drug use: No   Sexual activity: Yes  Other Topics Concern   Not on file  Social History Narrative   She reports she is single. She has 1 daughter. She is an Programmer, systems.  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     2 caffeinated beverages daily.   No alcohol tobacco or drug use.   Social Drivers of Health   Financial Resource Strain: High Risk (10/17/2023)   Overall Financial Resource Strain (CARDIA)    Difficulty of Paying Living Expenses: Hard  Food Insecurity: Food Insecurity Present (10/17/2023)   Hunger Vital Sign    Worried About Running Out of Food in the Last  Year: Sometimes true    Ran Out of Food in the Last Year: Sometimes true  Transportation Needs: Patient Declined (10/17/2023)   PRAPARE - Transportation    Lack of Transportation (Medical): Patient declined    Lack of Transportation (Non-Medical): Patient declined  Physical Activity: Sufficiently Active (10/17/2023)   Exercise Vital Sign    Days of Exercise per Week: 5 days    Minutes of Exercise per Session: 60 min  Stress: Stress Concern Present (10/17/2023)   [employer] of Occupational Health - Occupational  Stress Questionnaire    Feeling of Stress: Rather much  Social Connections: Socially Integrated (10/17/2023)   Social Connection and Isolation Panel    Frequency of Communication with Friends and Family: Twice a week    Frequency of Social Gatherings with Friends and Family: Once a week    Attends Religious Services: More than 4 times per year    Active Member of [employer] or Organizations: Yes    Attends Banker Meetings: 1 to 4 times per year    Marital Status: Married    Tobacco Counseling Counseling given: No    Clinical Intake:  Pre-visit preparation completed: Yes  Pain : No/denies pain     BMI - recorded: 26.61 Nutritional Status: BMI 25 -29 Overweight Nutritional Risks: None Diabetes: Yes CBG done?: No Did pt. bring in CBG monitor from home?: No  Lab Results  Component Value Date   HGBA1C 10.4 (A) 04/05/2023   HGBA1C 7.3 (A) 01/05/2023   HGBA1C 8.8 (H) 11/04/2022     How often do you need to have someone help you when you read instructions, pamphlets, or other written materials from your doctor or pharmacy?: 1 - Never  Interpreter Needed?: No  Information entered by :: Verdie Saba, CMA   Activities of Daily Living     10/16/2023    9:30 PM 11/04/2022    8:31 PM  In your present state of health, do you have any difficulty performing the following activities:  Hearing? 0 0  Vision? 0 1  Difficulty concentrating or making  decisions? 0 0  Walking or climbing stairs? 0 0  Dressing or bathing? 0 0  Doing errands, shopping? 0 0  Preparing Food and eating ? N N  Using the Toilet? N N  In the past six months, have you accidently leaked urine? N N  Do you have problems with loss of bowel control? N N  Managing your Medications? N Y  Managing your Finances? N N  Housekeeping or managing your Housekeeping? N N    Patient Care Team: [name], MD as PCP - General (Internal Medicine) [name], Aleene PARAS, MD (Inactive) as PCP - Cardiology (Cardiology) Cleotilde Sewer, OD (Optometry)  I have updated your Care Teams any recent Medical Services you may have received from other providers in the past year.     Assessment:   This is a routine wellness examination for Elvia.  Hearing/Vision screen Hearing Screening - Comments:: Denies hearing difficulties   Vision Screening - Comments:: Appt w/[facility] in 10/2023 for a Diabetic eye exam   Goals Addressed               This Visit's Progress     Patient Stated (pt-stated)        Patient state she is trying to maintain her bp and sugar levels       Depression Screen     10/17/2023    1:06 PM 04/05/2023    8:25 AM 01/05/2023    8:15 AM 10/06/2022    8:12 AM 07/06/2022    8:05 AM 01/05/2022    8:07 AM 10/06/2021    9:33 AM  PHQ 2/9 Scores  PHQ - 2 Score 0 0 0 0 0 0 0  PHQ- 9 Score 0          Fall Risk     10/17/2023    1:06 PM 10/16/2023    9:30 PM 04/05/2023    8:25 AM 01/05/2023    8:15  AM 11/04/2022    8:31 PM  Fall Risk   Falls in the past year? 0 0 0 0 0  Number falls in past yr: 0 0 0 0   Injury with Fall? 0 0 0 0   Risk for fall due to : No Fall Risks  No Fall Risks No Fall Risks   Follow up Falls evaluation completed;Falls prevention discussed  Falls evaluation completed Falls evaluation completed     MEDICARE RISK AT HOME:  Medicare Risk at Home Any stairs in or around the home?: No If so, are there any without handrails?: No Home  free of loose throw rugs in walkways, pet beds, electrical cords, etc?: Yes Adequate lighting in your home to reduce risk of falls?: Yes Life alert?: No Use of a cane, walker or w/c?: No Grab bars in the bathroom?: Yes Shower chair or bench in shower?: Yes Elevated toilet seat or a handicapped toilet?: No  TIMED UP AND GO:  Was the test performed?  No  Cognitive Function: 6CIT completed        10/17/2023    1:24 PM 07/23/2019    9:03 AM 07/18/2018   11:43 AM 09/30/2017    8:09 AM  6CIT Screen  What Year? 0 points 0 points 0 points 0 points  What month? 0 points 0 points 0 points 0 points  What time? 0 points 0 points 0 points 0 points  Count back from 20 0 points 0 points 0 points 0 points  Months in reverse 0 points 0 points 0 points 0 points  Repeat phrase 0 points 0 points 0 points 0 points  Total Score 0 points 0 points 0 points 0 points    Immunizations Immunization History  Administered Date(s) Administered   Fluad Quad(high Dose 65+) 10/23/2018, 10/31/2019, 01/06/2021   Fluad Trivalent(High Dose 65+) 10/06/2022   INFLUENZA, HIGH DOSE SEASONAL PF 10/30/2019   Influenza,inj,Quad PF,6+ Mos 03/09/2015   PFIZER(Purple Top)SARS-COV-2 Vaccination 04/20/2019, 05/04/2019   PPD Test 04/01/2013, 09/30/2017, 06/17/2020   Pneumococcal Conjugate-13 07/08/2017   Pneumococcal Polysaccharide-23 08/21/2018   Td 07/08/2017   Zoster Recombinant(Shingrix) 07/07/2020, 01/06/2021    Screening Tests Health Maintenance  Topic Date Due   COVID-19 Vaccine (3 - Pfizer risk series) 06/01/2019   FOOT EXAM  07/18/2019   Diabetic kidney evaluation - Urine ACR  07/29/2020   OPHTHALMOLOGY EXAM  10/24/2020   Influenza Vaccine  09/01/2023   HEMOGLOBIN A1C  10/06/2023   Colonoscopy  06/20/2024   Diabetic kidney evaluation - eGFR measurement  09/25/2024   Medicare Annual Wellness (AWV)  10/16/2024   Mammogram  04/12/2025   DTaP/Tdap/Td (2 - Tdap) 07/09/2027   Pneumococcal Vaccine: 50+ Years   Completed   DEXA SCAN  Completed   Hepatitis C Screening  Completed   Zoster Vaccines- Shingrix  Completed   HPV VACCINES  Aged Out   Meningococcal B Vaccine  Aged Out    Health Maintenance Items Addressed:  10/17/2023  Additional Screening:  Vision Screening: Recommended annual ophthalmology exams for early detection of glaucoma and other disorders of the eye. Is the patient up to date with their annual eye exam?  No  Who is the provider or what is the name of the office in which the patient attends annual eye exams? Appt w/Sally Cleotilde of [facility] in 10/2023 for a Diabetic eye exam.  Dental Screening: Recommended annual dental exams for proper oral hygiene  Community Resource Referral / Chronic Care Management: CRR required this visit?  No   CCM required this visit?  No   Plan:    I have personally reviewed and noted the following in the patient's chart:   Medical and social history Use of alcohol, tobacco or illicit drugs  Current medications and supplements including opioid prescriptions. Patient is not currently taking opioid prescriptions. Functional ability and status Nutritional status Physical activity Advanced directives List of other physicians Hospitalizations, surgeries, and ER visits in previous 12 months Vitals Screenings to include cognitive, depression, and falls Referrals and appointments  In addition, I have reviewed and discussed with patient certain preventive protocols, quality metrics, and best practice recommendations. A written personalized care plan for preventive services as well as general preventive health recommendations were provided to patient.   Verdie CHRISTELLA Saba, CMA   10/18/2023   After Visit Summary: (MyChart) Due to this being a telephonic visit, the after visit summary with patients personalized plan was offered to patient via MyChart   Notes: Scheduled a Diabetes f/u appt for the pt for 10/26/2023

## 2023-10-24 ENCOUNTER — Ambulatory Visit: Admitting: Emergency Medicine

## 2023-10-24 DIAGNOSIS — E119 Type 2 diabetes mellitus without complications: Secondary | ICD-10-CM | POA: Diagnosis not present

## 2023-10-24 DIAGNOSIS — H53143 Visual discomfort, bilateral: Secondary | ICD-10-CM | POA: Diagnosis not present

## 2023-10-26 ENCOUNTER — Ambulatory Visit: Admitting: Emergency Medicine

## 2023-10-30 ENCOUNTER — Encounter: Payer: Self-pay | Admitting: Emergency Medicine

## 2023-10-30 ENCOUNTER — Ambulatory Visit: Admitting: Emergency Medicine

## 2023-10-30 VITALS — BP 140/80 | HR 82 | Temp 97.4°F | Ht 64.0 in | Wt 148.2 lb

## 2023-10-30 DIAGNOSIS — E1159 Type 2 diabetes mellitus with other circulatory complications: Secondary | ICD-10-CM

## 2023-10-30 DIAGNOSIS — M2550 Pain in unspecified joint: Secondary | ICD-10-CM

## 2023-10-30 DIAGNOSIS — E785 Hyperlipidemia, unspecified: Secondary | ICD-10-CM | POA: Diagnosis not present

## 2023-10-30 DIAGNOSIS — Z7984 Long term (current) use of oral hypoglycemic drugs: Secondary | ICD-10-CM

## 2023-10-30 DIAGNOSIS — R051 Acute cough: Secondary | ICD-10-CM | POA: Diagnosis not present

## 2023-10-30 DIAGNOSIS — E1169 Type 2 diabetes mellitus with other specified complication: Secondary | ICD-10-CM | POA: Diagnosis not present

## 2023-10-30 DIAGNOSIS — I152 Hypertension secondary to endocrine disorders: Secondary | ICD-10-CM | POA: Diagnosis not present

## 2023-10-30 LAB — COMPREHENSIVE METABOLIC PANEL WITH GFR
ALT: 17 U/L (ref 0–35)
AST: 17 U/L (ref 0–37)
Albumin: 4.7 g/dL (ref 3.5–5.2)
Alkaline Phosphatase: 109 U/L (ref 39–117)
BUN: 14 mg/dL (ref 6–23)
CO2: 27 meq/L (ref 19–32)
Calcium: 9.7 mg/dL (ref 8.4–10.5)
Chloride: 100 meq/L (ref 96–112)
Creatinine, Ser: 0.73 mg/dL (ref 0.40–1.20)
GFR: 81.83 mL/min (ref >60.00)
Glucose, Bld: 210 mg/dL — ABNORMAL HIGH (ref 70–99)
Potassium: 3.7 meq/L (ref 3.5–5.1)
Sodium: 134 meq/L — ABNORMAL LOW (ref 135–145)
Total Bilirubin: 0.5 mg/dL (ref 0.2–1.2)
Total Protein: 7.8 g/dL (ref 6.0–8.3)

## 2023-10-30 LAB — LIPID PANEL
Cholesterol: 207 mg/dL — ABNORMAL HIGH (ref 0–200)
HDL: 45.4 mg/dL (ref >39.00)
LDL Cholesterol: 113 mg/dL — ABNORMAL HIGH (ref 0–99)
NonHDL: 161.34
Total CHOL/HDL Ratio: 5
Triglycerides: 240 mg/dL — ABNORMAL HIGH (ref 0.0–149.0)
VLDL: 48 mg/dL — ABNORMAL HIGH (ref 0.0–40.0)

## 2023-10-30 LAB — POCT GLYCOSYLATED HEMOGLOBIN (HGB A1C): Hemoglobin A1C: 10.1 % — AB (ref 4.0–5.6)

## 2023-10-30 LAB — MICROALBUMIN / CREATININE URINE RATIO
Creatinine,U: 46.5 mg/dL
Microalb Creat Ratio: 55.1 mg/g — ABNORMAL HIGH (ref 0.0–30.0)
Microalb, Ur: 2.6 mg/dL — ABNORMAL HIGH (ref 0.0–1.9)

## 2023-10-30 LAB — SEDIMENTATION RATE: Sed Rate: 46 mm/h — ABNORMAL HIGH (ref 0–30)

## 2023-10-30 MED ORDER — METFORMIN HCL 500 MG PO TABS: 500.0000 mg | ORAL_TABLET | Freq: Two times a day (BID) | ORAL | 3 refills | Status: DC

## 2023-10-30 MED ORDER — BENZONATATE 200 MG PO CAPS: 200.0000 mg | ORAL_CAPSULE | Freq: Two times a day (BID) | ORAL | 0 refills | Status: AC | PRN

## 2023-10-30 MED ORDER — EMPAGLIFLOZIN 25 MG PO TABS: 25.0000 mg | ORAL_TABLET | Freq: Every day | ORAL | 3 refills | Status: DC

## 2023-10-30 MED ORDER — GLIPIZIDE 10 MG PO TABS: 10.0000 mg | ORAL_TABLET | Freq: Two times a day (BID) | ORAL | 3 refills | Status: DC

## 2023-10-30 NOTE — Assessment & Plan Note (Signed)
 Most likely viral respiratory infection Continue over-the-counter Mucinex DM Continue NyQuil at bedtime Start Tessalon  200 mg 3 times a day

## 2023-10-30 NOTE — Assessment & Plan Note (Signed)
 Uncontrolled diabetes with hemoglobin A1c at 10.1 Increase Jardiance  to 25 mg daily and continue glipizide  10 mg twice a day.  Start metformin  500 mg twice a day Not taking Rybelsus  due to cost. Cardiovascular risks associated with diabetes and dyslipidemia discussed Diet and nutrition discussed Continue rosuvastatin  40 mg daily

## 2023-10-30 NOTE — Assessment & Plan Note (Signed)
 BP Readings from Last 3 Encounters:  10/30/23 (!) 140/80  09/26/23 (!) 159/80  05/30/23 (!) 144/75  Blood pressure still elevated in the office Presently on amlodipine  10 mg daily and hydrochlorothiazide  25 mg daily Also taking metoprolol  succinate 25 mg daily Allergic to ACE inhibitors Uncontrolled diabetes with hemoglobin A1c of 10.1 Not taking Rybelsus  due to cost Taking Jardiance  10 mg daily and glipizide  10 mg in the morning Cardiovascular risks associated with uncontrolled diabetes discussed Diet and nutrition discussed Recommend clinical pharmacist evaluation Will increase Jardiance  to 25 mg daily Start metformin  500 mg twice a day and glipizide  10 mg twice a day Follow-up in 4 weeks

## 2023-10-30 NOTE — Patient Instructions (Signed)

## 2023-10-30 NOTE — Progress Notes (Signed)
 Erin Benitez Public 73 y.o.   Chief Complaint  Patient presents with   Follow-up    ER-visit or nose bleed, happened again 2 weeks ago, cough, HA, difficulty standing after sitting down     HISTORY OF PRESENT ILLNESS: This is a 73 y.o. female here for follow-up of chronic medical conditions including diabetes Complaining of some pain and weakness to shoulders and hips Also complaining of persistent cough for 2 weeks Not taking Rybelsus  due to cost Only taking glipizide  once a day and Jardiance  once a day No other complaints or medical concerns today. ER visit 2 weeks ago for nosebleeds.  Doing much better. Lab Results  Component Value Date   HGBA1C 10.4 (A) 04/05/2023   BP Readings from Last 3 Encounters:  10/30/23 (!) 140/80  09/26/23 (!) 159/80  05/30/23 (!) 144/75     HPI   Prior to Admission medications   Medication Sig Start Date End Date Taking? Authorizing Provider  amLODipine  (NORVASC ) 10 MG tablet Take 1 tablet (10 mg total) by mouth daily. 01/09/23  Yes Amerah Puleo, Emil Schanz, MD  diphenhydramine -acetaminophen  (TYLENOL  PM) 25-500 MG TABS tablet Take 1 tablet by mouth at bedtime as needed (prn for sleep).   Yes [provider]  empagliflozin  (JARDIANCE ) 10 MG TABS tablet Take 1 tablet (10 mg total) by mouth daily before breakfast. 04/05/23  Yes Hobson Lax, Emil Schanz, MD  glipiZIDE  (GLUCOTROL ) 10 MG tablet TAKE 1 TABLET BY MOUTH ONCE DAILY BEFORE BREAKFAST 09/10/23  Yes Macklin Jacquin, Emil Schanz, MD  Glucagon  (GVOKE HYPOPEN  2-PACK) 0.5 MG/0.1ML SOAJ Inject 0.5 mg into the skin as needed. 10/06/22  Yes Elcie Pelster, Emil Schanz, MD  hydrochlorothiazide  (HYDRODIURIL ) 25 MG tablet Take 1 tablet (25 mg total) by mouth daily. 05/30/23 10/30/23 Yes [name], [name], MD  HYDROcodone -acetaminophen  (NORCO) 5-325 MG tablet Take 1 tablet by mouth every 6 (six) hours as needed. 01/19/23  Yes Agarwala, Anshul, MD  metoprolol  succinate (TOPROL -XL) 25 MG 24 hr tablet Take 1 tablet by mouth once  daily 10/04/23  Yes Ishi Danser Jose, MD  metoprolol  tartrate (LOPRESSOR ) 100 MG tablet Take 1 tablet by mouth two hours prior to scan 05/30/23  Yes [name], [name], MD  pantoprazole  (PROTONIX ) 40 MG tablet Take 1 tablet (40 mg total) by mouth daily. 06/13/23  Yes Emori Kamau, Emil Schanz, MD  potassium chloride  (KLOR-CON ) 10 MEQ tablet Take 1 tablet (10 mEq total) by mouth 2 (two) times daily. 05/30/23  Yes [name], [name], MD  rosuvastatin  (CRESTOR ) 40 MG tablet Take 1 tablet by mouth once daily 10/04/23  Yes Khadar Monger, Emil Schanz, MD  Semaglutide  7 MG TABS Take 1 tablet (7 mg total) by mouth daily. 10/06/22  Yes Joceline Hinchcliff, Emil Schanz, MD  zolpidem  (AMBIEN ) 10 MG tablet Take 1 tablet (10 mg total) by mouth at bedtime as needed for sleep. 06/19/23 10/30/23 Yes [name], MD    Allergies  Allergen Reactions   Lisinopril  Swelling   Peanut-Containing Drug Products Anaphylaxis, Hives, Swelling and Other (See Comments)    Pistachio's - swelling of the throat      Tramadol  Nausea And Vomiting    Headache & dizziness with n&v    Patient Active Problem List   Diagnosis Date Noted   Aortic atherosclerosis 04/05/2023   Unstable angina (HCC) 04/05/2023   Trigger finger, right ring finger 03/16/2022   Insomnia 10/06/2021   NAFLD (nonalcoholic fatty liver disease) 93/92/7976   Protrusion of cervical intervertebral disc 10/21/2020   Spinal stenosis of cervical region 10/21/2020  Chronic left shoulder pain 07/07/2020   Dyslipidemia associated with type 2 diabetes mellitus (HCC) 09/06/2017   Diverticulosis of colon without hemorrhage 03/09/2015   Hyperlipidemia 03/09/2015   Gastroesophageal reflux disease without esophagitis 03/09/2015   Hypertension associated with diabetes (HCC) 04/01/2013    Past Medical History:  Diagnosis Date   Allergy    Asthma    Bronchitis    Cataract    Diverticulosis    DM2 (diabetes mellitus, type 2) (HCC)    GERD (gastroesophageal reflux disease)     Heart murmur    Hyperplastic colon polyp    Hypertension     Past Surgical History:  Procedure Laterality Date   CESAREAN SECTION     COLONOSCOPY     ESOPHAGOGASTRODUODENOSCOPY     TONSILLECTOMY      Social History   Socioeconomic History   Marital status: Single    Spouse name: Not on file   Number of children: 1   Years of education: Not on file   Highest education level: Associate degree: occupational, Scientist, product/process development, or vocational program  Occupational History   Occupation: Programmer, systems  Tobacco Use   Smoking status: Never   Smokeless tobacco: Never  Vaping Use   Vaping status: Never Used  Substance and Sexual Activity   Alcohol use: No   Drug use: No   Sexual activity: Yes  Other Topics Concern   Not on file  Social History Narrative   She reports she is single. She has 1 daughter. She is an Programmer, systems.  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     2 caffeinated beverages daily.   No alcohol tobacco or drug use.   Social Drivers of Corporate investment banker Strain: High Risk (10/17/2023)   Overall Financial Resource Strain (CARDIA)    Difficulty of Paying Living Expenses: Hard  Food Insecurity: Food Insecurity Present (10/17/2023)   Hunger Vital Sign    Worried About Running Out of Food in the Last Year: Sometimes true    Ran Out of Food in the Last Year: Sometimes true  Transportation Needs: Patient Declined (10/17/2023)   PRAPARE - Administrator, Civil Service (Medical): Patient declined    Lack of Transportation (Non-Medical): Patient declined  Physical Activity: Sufficiently Active (10/17/2023)   Exercise Vital Sign    Days of Exercise per Week: 5 days    Minutes of Exercise per Session: 60 min  Stress: Stress Concern Present (10/17/2023)   [employer] of Occupational Health - Occupational Stress  Questionnaire    Feeling of Stress: Rather much  Social Connections: Socially Integrated (10/17/2023)   Social Connection and Isolation Panel    Frequency of Communication with Friends and Family: Twice a week    Frequency of Social Gatherings with Friends and Family: Once a week    Attends Religious Services: More than 4 times per year    Active Member of [employer] or Organizations: Yes    Attends Banker Meetings: 1 to 4 times per year    Marital Status: Married  Catering manager Violence: Not At Risk (10/17/2023)   Humiliation, Afraid, Rape, and Kick questionnaire    Fear of Current or Ex-Partner: No    Emotionally Abused: No    Physically Abused: No    Sexually Abused: No    Family History  Problem Relation Age of Onset  Cancer Mother        ovarian cancer   Heart disease Mother    Liver disease Mother    Hyperlipidemia Mother    Stroke Mother    Cancer Father        lung cancer   Cancer Sister    Hyperlipidemia Sister    Cancer Sister    Hyperlipidemia Sister    Diabetes Maternal Aunt    Colon cancer Maternal Aunt        x2 aunts   Colon cancer Maternal Uncle        2 uncles   Colon cancer Paternal Grandfather    Esophageal cancer Neg Hx    Rectal cancer Neg Hx    Stomach cancer Neg Hx      Review of Systems  Constitutional: Negative.  Negative for chills and fever.  HENT: Negative.  Negative for congestion and sore throat.   Respiratory:  Positive for cough. Negative for sputum production and shortness of breath.   Cardiovascular: Negative.  Negative for chest pain and palpitations.  Gastrointestinal:  Negative for abdominal pain, diarrhea, nausea and vomiting.  Genitourinary: Negative.  Negative for dysuria and hematuria.  Musculoskeletal:  Positive for joint pain.  Skin: Negative.  Negative for rash.  Neurological: Negative.  Negative for dizziness and headaches.  All other systems reviewed and are negative.   Vitals:   10/30/23 1523  BP: (!)  140/80  Pulse: 82  Temp: (!) 97.4 F (36.3 C)  SpO2: 98%    Physical Exam Vitals reviewed.  Constitutional:      Appearance: Normal appearance.  HENT:     Head: Normocephalic.     Mouth/Throat:     Mouth: Mucous membranes are moist.     Pharynx: Oropharynx is clear.  Eyes:     Extraocular Movements: Extraocular movements intact.     Conjunctiva/sclera: Conjunctivae normal.     Pupils: Pupils are equal, round, and reactive to light.  Cardiovascular:     Rate and Rhythm: Normal rate and regular rhythm.     Pulses: Normal pulses.     Heart sounds: Normal heart sounds.  Pulmonary:     Effort: Pulmonary effort is normal.     Breath sounds: Normal breath sounds.  Abdominal:     Palpations: Abdomen is soft.     Tenderness: There is no abdominal tenderness.  Musculoskeletal:        General: No tenderness or deformity.     Cervical back: No tenderness.     Right lower leg: No edema.     Left lower leg: No edema.  Lymphadenopathy:     Cervical: No cervical adenopathy.  Skin:    General: Skin is warm and dry.     Capillary Refill: Capillary refill takes less than 2 seconds.  Neurological:     General: No focal deficit present.     Mental Status: She is alert and oriented to person, place, and time.  Psychiatric:        Mood and Affect: Mood normal.        Behavior: Behavior normal.    Results for orders placed or performed in visit on 10/30/23 (from the past 24 hours)  POCT HgB A1C     Status: Abnormal   Collection Time: 10/30/23  3:35 PM  Result Value Ref Range   Hemoglobin A1C 10.1 (A) 4.0 - 5.6 %   HbA1c POC (<> result, manual entry)     HbA1c, POC (prediabetic range)  HbA1c, POC (controlled diabetic range)       ASSESSMENT & PLAN: A total of 44 minutes was spent with the patient and counseling/coordination of care regarding preparing for this visit, review of most recent office visit notes, review of multiple chronic medical conditions and their management,  cardiovascular risks associated with uncontrolled diabetes, review of all medications and changes made, review of most recent bloodwork results including interpretation of today's hemoglobin A1c, review of health maintenance items, education on nutrition, prognosis, documentation, and need for follow up.   Problem List Items Addressed This Visit       Cardiovascular and Mediastinum   Hypertension associated with diabetes (HCC) - Primary   BP Readings from Last 3 Encounters:  10/30/23 (!) 140/80  09/26/23 (!) 159/80  05/30/23 (!) 144/75  Blood pressure still elevated in the office Presently on amlodipine  10 mg daily and hydrochlorothiazide  25 mg daily Also taking metoprolol  succinate 25 mg daily Allergic to ACE inhibitors Uncontrolled diabetes with hemoglobin A1c of 10.1 Not taking Rybelsus  due to cost Taking Jardiance  10 mg daily and glipizide  10 mg in the morning Cardiovascular risks associated with uncontrolled diabetes discussed Diet and nutrition discussed Recommend clinical pharmacist evaluation Will increase Jardiance  to 25 mg daily Start metformin  500 mg twice a day and glipizide  10 mg twice a day Follow-up in 4 weeks       Relevant Medications   empagliflozin  (JARDIANCE ) 25 MG TABS tablet   metFORMIN  (GLUCOPHAGE ) 500 MG tablet   glipiZIDE  (GLUCOTROL ) 10 MG tablet   Other Relevant Orders   AMB Referral [facility]BCI Care Management   Microalbumin / creatinine urine ratio   Comprehensive metabolic panel with GFR     Endocrine   Dyslipidemia associated with type 2 diabetes mellitus (HCC)   Uncontrolled diabetes with hemoglobin A1c at 10.1 Increase Jardiance  to 25 mg daily and continue glipizide  10 mg twice a day.  Start metformin  500 mg twice a day Not taking Rybelsus  due to cost. Cardiovascular risks associated with diabetes and dyslipidemia discussed Diet and nutrition discussed Continue rosuvastatin  40 mg daily      Relevant Medications   empagliflozin  (JARDIANCE ) 25 MG  TABS tablet   metFORMIN  (GLUCOPHAGE ) 500 MG tablet   glipiZIDE  (GLUCOTROL ) 10 MG tablet   Other Relevant Orders   POCT HgB A1C (Completed)   AMB Referral VBCI Care Management   Microalbumin / creatinine urine ratio   Comprehensive metabolic panel with GFR   Lipid panel     Other   Arthralgia of multiple joints   Pain management discussed Recommend rheumatology blood work May need rheumatology referral.      Relevant Orders   ANA,IFA RA Diag Pnl w/rflx Tit/Patn   Sedimentation rate   Acute cough   Most likely viral respiratory infection Continue over-the-counter Mucinex DM Continue NyQuil at bedtime Start Tessalon  200 mg 3 times a day      Relevant Medications   benzonatate  (TESSALON ) 200 MG capsule   Patient Instructions  Diabetes: Carbohydrate Counting for Adults Carbohydrate counting is a method of keeping track of how many carbohydrates you eat. Eating carbohydrates increases the amount of sugar, also called glucose, in your blood. By counting how many carbohydrates you eat, you can improve how well you manage your blood sugar. This, in turn, helps you manage your diabetes. Carbohydrates are measured in grams (g) per serving. It's important to know how many carbohydrates (in grams or by serving size) you can have in each meal. This is different for every  person. A dietitian can help you make a meal plan and calculate how many carbohydrates you should have at each meal and snack. What foods contain carbohydrates? Carbohydrates are found in these foods: Grains, such as breads and cereals. Dried beans and soy products. Starchy vegetables, such as potatoes, peas, and corn. Fruit and fruit juices. Milk and yogurt. Sweets and snack foods like cake, cookies, candy, chips, and soft drinks. How do I count carbohydrates in foods? There are two ways to count carbohydrates in food. You can read food labels or learn standard serving sizes of foods. You can use either of these methods  or a combination of both. Using the Nutrition Facts label The Nutrition Facts list is included on the labels of almost all packaged foods and drinks in the [place] It includes: The serving size. Information about nutrients in each serving. This includes the grams of carbohydrate per serving. To use the Nutrition Facts, decide how many servings you will have. Then, multiply the number of servings by the number of carbohydrates per serving. The resulting number is the total grams of carbohydrates that you'll be having. Learning the standard serving sizes of foods When you eat carbohydrate foods that aren't packaged or don't include Nutrition Facts on the label, you need to measure the servings in order to count the grams of carbohydrates. Measure the foods that you'll eat with a food scale or measuring cup, if needed. Decide how many standard-size servings you'll eat. Multiply the number of servings by 15. For foods that contain carbohydrates, one serving equals 15 g of carbohydrates. For example, if you eat 2 cups or 10 oz (300 g) of strawberries, you'll have eaten 2 servings and 30 g of carbohydrates (2 servings x 15 g = 30 g). For foods that have more than one food mixed, such as soups and casseroles, you must count the carbohydrates in each food that's included. Here's a list of standard serving sizes for common carbohydrate-rich foods. Each of these servings has about 15 g of carbohydrates: 1 slice of bread. 1 six-inch (15 cm) tortilla. ? cup or 2 oz (53 g) of cooked rice or pasta.  cup or 3 oz (85 g) of cooked or canned, drained, and rinsed beans or lentils.  cup or 3 oz (85 g) of a starchy vegetable, such as peas, corn, or squash.  cup or 4 oz (120 g) of hot cereal.  cup or 3 oz (85 g) of boiled or mashed potatoes, or  or 3 oz (85 g) of a large baked potato.  cup or 4 fl oz (118 mL) of fruit juice. 1 cup or 8 fl oz (237 mL) of milk. 1 small or 4 oz (106 g) apple.  or 2 oz (63 g) of a  medium banana. 1 cup or 5 oz (150 g) of strawberries. 3 cups or 1 oz (28.3 g) of popped popcorn. What is an example of carbohydrate counting? To calculate the grams of carbohydrates in this sample meal, follow the steps below. Sample meal 3 oz (85 g) chicken breast. ? cup or 4 oz (106 g) of brown rice.  cup or 3 oz (85 g) of corn. 1 cup or 8 fl oz (237 mL) of milk. 1 cup or 5 oz (150 g) of strawberries with sugar-free whipped topping. Carbohydrate calculation Identify the foods that have carbohydrates: Rice. Corn. Milk. Strawberries. Calculate how many servings you have of each food: 2 servings of rice. 1 serving of corn. 1 serving of milk.  1 serving of strawberries. Multiply each number of servings by 15 g: 2 servings of rice x 15 g = 30 g. 1 serving of corn x 15 g = 15 g. 1 serving of milk x 15 g = 15 g. 1 serving of strawberries x 15 g = 15 g. Add together all of the amounts to find the total grams of carbohydrates eaten: 30 g + 15 g + 15 g + 15 g = 75 g of carbohydrates total. Where to find more information To learn more, go to: American Diabetes Association at diabetes.org. Click Search and type carb counting. Find the link you need. Centers for Disease Control and Prevention at TonerPromos.no. Click Search and type diabetes. Find the link you need. Academy of Nutrition and Dietetics: eatright.org This information is not intended to replace advice given to you by your health care provider. Make sure you discuss any questions you have with your health care provider. Document Revised: 01/04/2023 Document Reviewed: 01/04/2023 Elsevier Patient Education  2025 Elsevier Inc.     Emil Schaumann, MD [facility]

## 2023-10-30 NOTE — Assessment & Plan Note (Signed)
 Pain management discussed Recommend rheumatology blood work May need rheumatology referral.

## 2023-10-31 ENCOUNTER — Encounter (HOSPITAL_COMMUNITY): Payer: Self-pay

## 2023-10-31 LAB — ANA,IFA RA DIAG PNL W/RFLX TIT/PATN
Anti Nuclear Antibody (ANA): NEGATIVE
Cyclic Citrullin Peptide Ab: <16 U
Rheumatoid fact SerPl-aCnc: <10 [IU]/mL (ref <14)

## 2023-11-01 ENCOUNTER — Ambulatory Visit: Payer: Self-pay | Admitting: Emergency Medicine

## 2023-11-01 DIAGNOSIS — R051 Acute cough: Secondary | ICD-10-CM

## 2023-11-01 MED ORDER — PROMETHAZINE-DM 6.25-15 MG/5ML PO SYRP: 5.0000 mL | ORAL_SOLUTION | Freq: Four times a day (QID) | ORAL | 0 refills | Status: AC | PRN

## 2023-11-02 ENCOUNTER — Telehealth: Payer: Self-pay | Admitting: *Deleted

## 2023-11-02 ENCOUNTER — Ambulatory Visit (HOSPITAL_COMMUNITY)
Admission: RE | Admit: 2023-11-02 | Discharge: 2023-11-02 | Disposition: A | Discharge status: Home / Self Care | Source: Ambulatory Visit | Attending: Cardiovascular Disease | Admitting: Cardiovascular Disease

## 2023-11-02 DIAGNOSIS — I10 Essential (primary) hypertension: Secondary | ICD-10-CM | POA: Diagnosis not present

## 2023-11-02 DIAGNOSIS — I700 Atherosclerosis of aorta: Secondary | ICD-10-CM | POA: Insufficient documentation

## 2023-11-02 DIAGNOSIS — R0609 Other forms of dyspnea: Secondary | ICD-10-CM | POA: Insufficient documentation

## 2023-11-02 DIAGNOSIS — Z79899 Other long term (current) drug therapy: Secondary | ICD-10-CM | POA: Diagnosis not present

## 2023-11-02 DIAGNOSIS — I2510 Atherosclerotic heart disease of native coronary artery without angina pectoris: Secondary | ICD-10-CM | POA: Insufficient documentation

## 2023-11-02 MED ORDER — IOHEXOL 350 MG/ML SOLN
100.0000 mL | Freq: Once | INTRAVENOUS | Status: AC | PRN
  Administered 2023-11-02: 100 mL via INTRAVENOUS

## 2023-11-02 MED ORDER — NITROGLYCERIN 0.4 MG SL SUBL
0.8000 mg | SUBLINGUAL_TABLET | Freq: Once | SUBLINGUAL | Status: AC
  Administered 2023-11-02: 0.8 mg via SUBLINGUAL

## 2023-11-02 NOTE — Progress Notes (Signed)
 Care Guide Pharmacy Note  11/02/2023 Name: Erin Benitez MRN: [id number] DOB: 1950/02/18  Referred By: [name], MD Reason for referral: Complex Care Management (Outreach to schedule referral with pharmacist )   Erin Benitez is a 73 y.o. year old female who is a primary care patient of Sagardia, Miguel Jose, MD.  Erin Benitez was referred to the pharmacist for assistance related to: DMII  Successful contact was made with the patient to discuss pharmacy services including being ready for the pharmacist to call at least 5 minutes before the scheduled appointment time and to have medication bottles and any blood pressure readings ready for review. The patient agreed to meet with the pharmacist via telephone visit on 11/10/2023  Thedford Franks, CMA [place]  [facility], [facility] Guide Direct Dial: [phone]  Fax: [phone] Website: [email or web address]

## 2023-11-02 NOTE — Telephone Encounter (Signed)
 I sent a new prescription yesterday.  Please follow-up on this

## 2023-11-04 ENCOUNTER — Ambulatory Visit: Payer: Self-pay | Admitting: Cardiology

## 2023-11-04 NOTE — Progress Notes (Signed)
 Your coronary CT angiogram shows minimal blockages best treated with medical therapy including control of BP, high cholesterol and regular exercise. Your primary care physician can address these issues

## 2023-11-10 ENCOUNTER — Other Ambulatory Visit

## 2023-11-13 ENCOUNTER — Telehealth: Payer: Self-pay | Admitting: *Deleted

## 2023-11-13 NOTE — Progress Notes (Unsigned)
 Complex Care Management Care Guide Note  11/13/2023 Name: Erin Benitez MRN: [id number] DOB: 03-28-1950  Erin Benitez is a 72 y.o. year old female who is a primary care patient of Sagardia, Miguel Jose, MD and is actively engaged with the care management team. I reached out to Mikel JINNY Public by phone today to assist with re-scheduling  with the Pharmacist.  Follow up plan: Unsuccessful telephone outreach attempt made. A HIPAA compliant phone message was left for the patient providing contact information and requesting a return call.  Thedford Franks, CMA [place]  [facility], [facility] Guide Direct Dial: [phone]  Fax: [phone] Website: [email or web address]

## 2023-11-14 NOTE — Progress Notes (Signed)
 Complex Care Management Care Guide Note  11/14/2023 Name: Erin Benitez MRN: [id number] DOB: 11-13-1950  Erin Benitez is a 73 y.o. year old female who is a primary care patient of Sagardia, Miguel Jose, MD and is actively engaged with the care management team. I reached out to Mikel JINNY Public by phone today to assist with re-scheduling  with the Pharmacist.  Follow up plan: Unsuccessful telephone outreach attempt made. A HIPAA compliant phone message was left for the patient providing contact information and requesting a return call. No further outreach attempts will be made due to inability to maintain patient contact.   Thedford Franks, CMA [place]  [facility], [facility] Guide Direct Dial: [phone]  Fax: [phone] Website: [email or web address]

## 2023-12-04 ENCOUNTER — Encounter: Payer: Self-pay | Admitting: Radiology

## 2023-12-10 ENCOUNTER — Other Ambulatory Visit: Payer: Self-pay | Admitting: Emergency Medicine

## 2023-12-10 DIAGNOSIS — I152 Hypertension secondary to endocrine disorders: Secondary | ICD-10-CM

## 2023-12-20 ENCOUNTER — Encounter: Payer: Self-pay | Admitting: Emergency Medicine

## 2023-12-20 ENCOUNTER — Ambulatory Visit (INDEPENDENT_AMBULATORY_CARE_PROVIDER_SITE_OTHER): Admitting: Emergency Medicine

## 2023-12-20 VITALS — BP 160/80 | HR 84 | Temp 98.6°F | Ht 64.0 in | Wt 151.0 lb

## 2023-12-20 DIAGNOSIS — M2550 Pain in unspecified joint: Secondary | ICD-10-CM | POA: Diagnosis not present

## 2023-12-20 MED ORDER — MELOXICAM 15 MG PO TABS: 15.0000 mg | ORAL_TABLET | Freq: Every day | ORAL | 1 refills | Status: AC

## 2023-12-20 MED ORDER — ACETAMINOPHEN-CODEINE 300-30 MG PO TABS: 1.0000 | ORAL_TABLET | ORAL | 0 refills | Status: DC | PRN

## 2023-12-20 NOTE — Patient Instructions (Signed)
 Joint Pain  Joint pain can be caused by many things. It may go away if you follow instructions from your health care provider for taking care of yourself at home. Sometimes, you may need more treatment. Joint pain can be caused by: Bruises at the area of the joint. An injury caused by movements that are repeated. Wear and tear on the joint as you get older. Buildup of uric acid crystals in the joint. This is also called gout. Irritation and swelling of the joint. Types of arthritis. Infections of the joint or of the bone. Your provider may tell you to take pain medicine or wear an elastic bandage, sling, or splint. If your joint pain continues, you may need lab or imaging tests to find the cause of your joint pain. Follow these instructions at home: If you have an elastic bandage, sling, or splint that can be taken off: Wear the bandage, sling, or splint as told by your provider. Take it off only if your provider says you can. Check the skin under and around it every day. Tell your provider if you see problems. Loosen it if your fingers or toes tingle, are numb, or turn cold and blue. Keep it clean and dry. Ask your provider if you should remove it before bathing. If the bandage, sling, or splint is not waterproof: Do not let it get wet. Cover it when you take a bath or shower. Use a cover that does not let any water in. Managing pain, stiffness, and swelling     If told, put ice on the area. If you have an elastic bandage, sling, or splint that you can take off, remove it as told. Put ice in a plastic bag. Place a towel between your skin and the bag. Leave the ice on for 20 minutes, 2-3 times a day. If told, put heat on the area. Do this as often as told. Use the heat source that your provider recommends, such as a moist heat pack or a heating pad. Place a towel between your skin and the heat source. Leave the heat on for 20-30 minutes. If your skin turns bright red, take off the  ice or heat right away to prevent skin damage. The risk of damage is higher if you can't feel pain, heat, or cold. Move your fingers or toes often to reduce stiffness and swelling. Raise the injured area above the level of your heart while you're sitting or lying down. Use a pillow to support the painful area as needed. Activity Rest the painful joint as told. Do not do things that cause pain or make pain worse. Begin exercising or stretching the affected area as told by your provider. Return to normal activities when you are told. Ask what things are safe for you to do. General instructions Take your medicines as told by your provider. Treatment may include medicines for pain and swelling that are taken by mouth or applied to the skin. Do not smoke, vape, or use products with nicotine or tobacco in them. If you need help quitting, talk with your provider. Keep all follow-up visits. Your provider will want to check on your condition. Contact a health care provider if: You have pain that does not get better with medicine. Your joint pain does not improve within 3 days. You have more bruising or swelling. You have a fever. You lose 10 lb (4.5 kg) or more without trying. Get help right away if: You cannot move the joint. Your fingers  or toes tingle, become numb, or turn cold and blue. You have a fever along with a joint that's red, warm, and swollen. This information is not intended to replace advice given to you by your health care provider. Make sure you discuss any questions you have with your health care provider. Document Revised: 10/20/2022 Document Reviewed: 04/01/2022 Elsevier Patient Education  2024 [employer].

## 2023-12-20 NOTE — Assessment & Plan Note (Signed)
 Negative recent rheumatology blood work Recommend daily meloxicam  15 mg for 10 days Tylenol  with codeine  for breakthrough pains May need rheumatology referral in the near future

## 2023-12-20 NOTE — Progress Notes (Signed)
 Erin Benitez Public 73 y.o.   Chief Complaint  Patient presents with   stiffness    Joint stiffness and pain    HISTORY OF PRESENT ILLNESS: This is a 73 y.o. female complaining of joint pains all over for the past couple weeks Denies flulike symptoms.  Denies fever or chills. Denies any new medications No dietary recent changes. No other complaints or medical concerns today.  HPI   Prior to Admission medications   Medication Sig Start Date End Date Taking? Authorizing Provider  acetaminophen -codeine  (TYLENOL  #3) 300-30 MG tablet Take 1-2 tablets by mouth every 4 (four) hours as needed for moderate pain (pain score 4-6). 12/20/23  Yes Graycee Greeson, Emil Schanz, MD  amLODipine  (NORVASC ) 10 MG tablet Take 1 tablet (10 mg total) by mouth daily. 01/09/23  Yes Solash Tullo, Emil Schanz, MD  benzonatate  (TESSALON ) 200 MG capsule Take 1 capsule (200 mg total) by mouth 2 (two) times daily as needed for cough. 10/30/23  Yes Jamieson Hetland, Emil Schanz, MD  empagliflozin  (JARDIANCE ) 25 MG TABS tablet Take 1 tablet (25 mg total) by mouth daily. 10/30/23  Yes [name], Emil Schanz, MD  glipiZIDE  (GLUCOTROL ) 10 MG tablet TAKE 1 TABLET BY MOUTH ONCE DAILY BEFORE BREAKFAST 12/10/23  Yes [place] Ice, Emil Schanz, MD  Glucagon  (GVOKE HYPOPEN  2-PACK) 0.5 MG/0.1ML SOAJ Inject 0.5 mg into the skin as needed. 10/06/22  Yes [name], Emil Schanz, MD  hydrochlorothiazide  (HYDRODIURIL ) 25 MG tablet Take 1 tablet (25 mg total) by mouth daily. 05/30/23 12/20/23 Yes [name], [name], MD  meloxicam  (MOBIC ) 15 MG tablet Take 1 tablet (15 mg total) by mouth daily for 10 days. 12/20/23 12/30/23 Yes Humza Tallerico, Emil Schanz, MD  metFORMIN  (GLUCOPHAGE ) 500 MG tablet Take 1 tablet (500 mg total) by mouth 2 (two) times daily with a meal. 10/30/23  Yes Darrell Leonhardt, Emil Schanz, MD  metoprolol  succinate (TOPROL -XL) 25 MG 24 hr tablet Take 1 tablet by mouth once daily 10/04/23  Yes Tovah Slavick, Emil Schanz, MD  pantoprazole  (PROTONIX ) 40 MG tablet Take 1 tablet  (40 mg total) by mouth daily. 06/13/23  Yes Neveen Daponte, Emil Schanz, MD  potassium chloride  (KLOR-CON ) 10 MEQ tablet Take 1 tablet (10 mEq total) by mouth 2 (two) times daily. 05/30/23  Yes [name], [name], MD  promethazine -dextromethorphan (PROMETHAZINE -DM) 6.25-15 MG/5ML syrup Take 5 mLs by mouth 4 (four) times daily as needed for cough. 11/01/23  Yes [name], MD  rosuvastatin  (CRESTOR ) 40 MG tablet Take 1 tablet by mouth once daily 10/04/23  Yes [name], Emil Schanz, MD  Semaglutide  7 MG TABS Take 1 tablet (7 mg total) by mouth daily. 10/06/22  Yes [name], Emil Schanz, MD  zolpidem  (AMBIEN ) 10 MG tablet Take 1 tablet (10 mg total) by mouth at bedtime as needed for sleep. 06/19/23 12/20/23 Yes [name], MD    Allergies  Allergen Reactions   Lisinopril  Swelling   Peanut-Containing Drug Products Anaphylaxis, Hives, Swelling and Other (See Comments)    Pistachio's - swelling of the throat      Tramadol  Nausea And Vomiting    Headache & dizziness with n&v    Patient Active Problem List   Diagnosis Date Noted   Arthralgia of multiple joints 10/30/2023   Acute cough 10/30/2023   Aortic atherosclerosis 04/05/2023   Trigger finger, right ring finger 03/16/2022   Insomnia 10/06/2021   NAFLD (nonalcoholic fatty liver disease) 93/92/7976   Protrusion of cervical intervertebral disc 10/21/2020   Spinal stenosis of cervical region 10/21/2020   Chronic left shoulder pain  07/07/2020   Dyslipidemia associated with type 2 diabetes mellitus (HCC) 09/06/2017   Diverticulosis of colon without hemorrhage 03/09/2015   Hyperlipidemia 03/09/2015   Gastroesophageal reflux disease without esophagitis 03/09/2015   Hypertension associated with diabetes (HCC) 04/01/2013    Past Medical History:  Diagnosis Date   Allergy    Asthma    Bronchitis    Cataract    Diverticulosis    DM2 (diabetes mellitus, type 2) (HCC)    GERD (gastroesophageal reflux disease)    Heart murmur     Hyperplastic colon polyp    Hypertension     Past Surgical History:  Procedure Laterality Date   CESAREAN SECTION     COLONOSCOPY     ESOPHAGOGASTRODUODENOSCOPY     TONSILLECTOMY      Social History   Socioeconomic History   Marital status: Single    Spouse name: Not on file   Number of children: 1   Years of education: Not on file   Highest education level: Associate degree: occupational, scientist, product/process development, or vocational program  Occupational History   Occupation: Programmer, Systems  Tobacco Use   Smoking status: Never   Smokeless tobacco: Never  Vaping Use   Vaping status: Never Used  Substance and Sexual Activity   Alcohol use: No   Drug use: No   Sexual activity: Yes  Other Topics Concern   Not on file  Social History Narrative   She reports she is single. She has 1 daughter. She is an programmer, systems.  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     2 caffeinated beverages daily.   No alcohol tobacco or drug use.   Social Drivers of Health   Financial Resource Strain: Patient Declined (12/19/2023)   Overall Financial Resource Strain (CARDIA)    Difficulty of Paying Living Expenses: Patient declined  Recent Concern: Financial Resource Strain - High Risk (10/17/2023)   Overall Financial Resource Strain (CARDIA)    Difficulty of Paying Living Expenses: Hard  Food Insecurity: Patient Declined (12/19/2023)   Hunger Vital Sign    Worried About Running Out of Food in the Last Year: Patient declined    Ran Out of Food in the Last Year: Patient declined  Recent Concern: Food Insecurity - Food Insecurity Present (10/17/2023)   Hunger Vital Sign    Worried About Running Out of Food in the Last Year: Sometimes true    Ran Out of Food in the Last Year: Sometimes true  Transportation Needs: Patient Declined (12/19/2023)   PRAPARE - Scientist, Research (physical Sciences) (Medical): Patient declined    Lack of Transportation (Non-Medical): Patient declined  Physical Activity: Sufficiently Active (12/19/2023)   Exercise Vital Sign    Days of Exercise per Week: 4 days    Minutes of Exercise per Session: 40 min  Stress: Stress Concern Present (12/19/2023)   [employer] of Occupational Health - Occupational Stress Questionnaire    Feeling of Stress: To some extent  Social Connections: Unknown (12/19/2023)   Social Connection and Isolation Panel    Frequency of Communication with Friends and Family: More than three times a week    Frequency of Social Gatherings with Friends and Family: More than three times a week    Attends Religious Services: More than 4 times per year    Active Member of [employer] or Organizations: Yes  Attends Banker Meetings: More than 4 times per year    Marital Status: Patient declined  Intimate Partner Violence: Not At Risk (10/17/2023)   Humiliation, Afraid, Rape, and Kick questionnaire    Fear of Current or Ex-Partner: No    Emotionally Abused: No    Physically Abused: No    Sexually Abused: No    Family History  Problem Relation Age of Onset   Cancer Mother        ovarian cancer   Heart disease Mother    Liver disease Mother    Hyperlipidemia Mother    Stroke Mother    Cancer Father        lung cancer   Cancer Sister    Hyperlipidemia Sister    Cancer Sister    Hyperlipidemia Sister    Diabetes Maternal Aunt    Colon cancer Maternal Aunt        x2 aunts   Colon cancer Maternal Uncle        2 uncles   Colon cancer Paternal Grandfather    Esophageal cancer Neg Hx    Rectal cancer Neg Hx    Stomach cancer Neg Hx      Review of Systems  Constitutional: Negative.  Negative for chills and fever.  HENT: Negative.  Negative for congestion and sore throat.   Respiratory: Negative.  Negative for cough and shortness of breath.   Cardiovascular: Negative.  Negative for chest pain and  palpitations.  Gastrointestinal:  Negative for abdominal pain, diarrhea, nausea and vomiting.  Genitourinary: Negative.  Negative for dysuria and hematuria.  Musculoskeletal:  Positive for joint pain.  Skin: Negative.  Negative for rash.  Neurological: Negative.  Negative for dizziness and headaches.  All other systems reviewed and are negative.   Vitals:   12/20/23 1525 12/20/23 1532  BP: (!) 160/80 (!) 160/80  Pulse: 84   Temp: 98.6 F (37 C)   SpO2: 98%     Physical Exam Vitals reviewed.  Constitutional:      Appearance: Normal appearance.  HENT:     Head: Normocephalic.     Mouth/Throat:     Mouth: Mucous membranes are moist.     Pharynx: Oropharynx is clear.  Eyes:     Extraocular Movements: Extraocular movements intact.  Cardiovascular:     Rate and Rhythm: Normal rate and regular rhythm.     Pulses: Normal pulses.     Heart sounds: Normal heart sounds.  Pulmonary:     Effort: Pulmonary effort is normal.     Breath sounds: Normal breath sounds.  Musculoskeletal:        General: No swelling, tenderness or deformity.     Cervical back: No tenderness.  Lymphadenopathy:     Cervical: No cervical adenopathy.  Skin:    General: Skin is warm and dry.  Neurological:     General: No focal deficit present.     Mental Status: She is alert and oriented to person, place, and time.  Psychiatric:        Mood and Affect: Mood normal.        Behavior: Behavior normal.      ASSESSMENT & PLAN: Problem List Items Addressed This Visit       Other   Arthralgia of multiple joints - Primary   Negative recent rheumatology blood work Recommend daily meloxicam  15 mg for 10 days Tylenol  with codeine  for breakthrough pains May need rheumatology referral in the near future  Relevant Medications   meloxicam  (MOBIC ) 15 MG tablet   acetaminophen -codeine  (TYLENOL  #3) 300-30 MG tablet   Patient Instructions  Joint Pain  Joint pain can be caused by many things. It may go  away if you follow instructions from your health care provider for taking care of yourself at home. Sometimes, you may need more treatment. Joint pain can be caused by: Bruises at the area of the joint. An injury caused by movements that are repeated. Wear and tear on the joint as you get older. Buildup of uric acid crystals in the joint. This is also called gout. Irritation and swelling of the joint. Types of arthritis. Infections of the joint or of the bone. Your provider may tell you to take pain medicine or wear an elastic bandage, sling, or splint. If your joint pain continues, you may need lab or imaging tests to find the cause of your joint pain. Follow these instructions at home: If you have an elastic bandage, sling, or splint that can be taken off: Wear the bandage, sling, or splint as told by your provider. Take it off only if your provider says you can. Check the skin under and around it every day. Tell your provider if you see problems. Loosen it if your fingers or toes tingle, are numb, or turn cold and blue. Keep it clean and dry. Ask your provider if you should remove it before bathing. If the bandage, sling, or splint is not waterproof: Do not let it get wet. Cover it when you take a bath or shower. Use a cover that does not let any water in. Managing pain, stiffness, and swelling     If told, put ice on the area. If you have an elastic bandage, sling, or splint that you can take off, remove it as told. Put ice in a plastic bag. Place a towel between your skin and the bag. Leave the ice on for 20 minutes, 2-3 times a day. If told, put heat on the area. Do this as often as told. Use the heat source that your provider recommends, such as a moist heat pack or a heating pad. Place a towel between your skin and the heat source. Leave the heat on for 20-30 minutes. If your skin turns bright red, take off the ice or heat right away to prevent skin damage. The risk of damage  is higher if you can't feel pain, heat, or cold. Move your fingers or toes often to reduce stiffness and swelling. Raise the injured area above the level of your heart while you're sitting or lying down. Use a pillow to support the painful area as needed. Activity Rest the painful joint as told. Do not do things that cause pain or make pain worse. Begin exercising or stretching the affected area as told by your provider. Return to normal activities when you are told. Ask what things are safe for you to do. General instructions Take your medicines as told by your provider. Treatment may include medicines for pain and swelling that are taken by mouth or applied to the skin. Do not smoke, vape, or use products with nicotine or tobacco in them. If you need help quitting, talk with your provider. Keep all follow-up visits. Your provider will want to check on your condition. Contact a health care provider if: You have pain that does not get better with medicine. Your joint pain does not improve within 3 days. You have more bruising or swelling. You have a  fever. You lose 10 lb (4.5 kg) or more without trying. Get help right away if: You cannot move the joint. Your fingers or toes tingle, become numb, or turn cold and blue. You have a fever along with a joint that's red, warm, and swollen. This information is not intended to replace advice given to you by your health care provider. Make sure you discuss any questions you have with your health care provider. Document Revised: 10/20/2022 Document Reviewed: 04/01/2022 Elsevier Patient Education  2024 Elsevier Inc.   Emil Schaumann, MD [facility]

## 2023-12-22 ENCOUNTER — Encounter: Payer: Self-pay | Admitting: Physician Assistant

## 2023-12-22 ENCOUNTER — Ambulatory Visit: Admitting: Physician Assistant

## 2023-12-22 DIAGNOSIS — G8929 Other chronic pain: Secondary | ICD-10-CM

## 2023-12-22 DIAGNOSIS — M25511 Pain in right shoulder: Secondary | ICD-10-CM | POA: Diagnosis not present

## 2023-12-22 DIAGNOSIS — M25512 Pain in left shoulder: Secondary | ICD-10-CM

## 2023-12-22 MED ORDER — METHOCARBAMOL 500 MG PO TABS: 500.0000 mg | ORAL_TABLET | Freq: Four times a day (QID) | ORAL | 0 refills | Status: DC | PRN

## 2023-12-22 NOTE — Progress Notes (Signed)
 Office Visit Note   Patient: Erin Benitez           Date of Birth: 10-10-1950           MRN: [id number] Visit Date: 12/22/2023              Requested by: [name], MD [address] [place],  [place] [address] PCP: [name], MD  Chief Complaint  Patient presents with   Right Shoulder - Pain   Left Shoulder - Pain      HPI: Patient is a pleasant 73 year old woman who normally sees [name].  She has a history of impingement in her shoulders as well as a adhesive capsulitis.  She relates a history of no new injury however she thinks she may have had exacerbation of myalgias secondary to metformin  which she does not take anymore.  She is wondering if she could get steroid injections  Assessment & Plan: Visit Diagnoses:  1. Chronic pain of both shoulders     Plan: Findings more consistent with impingement syndrome than adhesive capsulitis.  Unfortunately her hemoglobin A1c done recently was 10.1.  This was last in the 2 months ago so I do not think even checking her hemoglobin A1c today would get it down to a satisfactory level.  Discussed trying physical therapy but she declined.  I told her I would call her in some muscle relaxants.  She should follow-up with [name] and [name] and obviously work on glycemic control with her primary care hopefully they can find a medication that does not react to get her A1c down.  She was prescribed meloxicam  which she said has helped a little I encouraged her to continue to take this on a regular basis  Follow-Up Instructions: No follow-ups on file.   Ortho Exam  Patient is alert, oriented, no adenopathy, well-dressed, normal affect, normal respiratory effort. Examination bilateral shoulder she has no redness no erythema she has good external rotation reproduces some pain but no stiffness.  She can come overhead which is painful bilaterally.  She has got good abductor external rotation strength only limited by pain.   She does have a positive empty can test right greater than left no reproduction of symptoms with movement of her neck    Imaging: No results found. No images are attached to the encounter.  Labs: Lab Results  Component Value Date   HGBA1C 10.1 (A) 10/30/2023   HGBA1C 10.4 (A) 04/05/2023   HGBA1C 7.3 (A) 01/05/2023   ESRSEDRATE 46 (H) 10/30/2023     Lab Results  Component Value Date   ALBUMIN 4.7 10/30/2023   ALBUMIN 4.8 04/05/2023   ALBUMIN 4.7 07/06/2022    No results found for: MG No results found for: VD25OH  No results found for: PREALBUMIN    Latest Ref Rng & Units 09/26/2023    4:42 AM 04/05/2023    9:36 AM 07/06/2022    8:37 AM  CBC EXTENDED  WBC 4.0 - 10.5 K/uL 3.9  4.1  4.5   RBC 3.87 - 5.11 MIL/uL 4.72  5.09  4.56   Hemoglobin 12.0 - 15.0 g/dL 86.7  85.6  87.2   HCT 36.0 - 46.0 % 40.5  44.4  39.3   Platelets 150 - 400 K/uL 200  237.0  250.0   NEUT# 1.7 - 7.7 K/uL 1.9  1.7  2.4   Lymph# 0.7 - 4.0 K/uL 1.4  1.7  1.5      There  is no height or weight on file to calculate BMI.  Orders:  No orders of the defined types were placed in this encounter.  Meds ordered this encounter  Medications   methocarbamol  (ROBAXIN ) 500 MG tablet    Sig: Take 1 tablet (500 mg total) by mouth every 6 (six) hours as needed for muscle spasms.    Dispense:  30 tablet    Refill:  0     Procedures: No procedures performed  Clinical Data: No additional findings.  ROS:  All other systems negative, except as noted in the HPI. Review of Systems  Objective: Vital Signs: There were no vitals taken for this visit.  Specialty Comments:  No specialty comments available.  PMFS History: Patient Active Problem List   Diagnosis Date Noted   Bilateral shoulder pain 12/22/2023   Arthralgia of multiple joints 10/30/2023   Acute cough 10/30/2023   Aortic atherosclerosis 04/05/2023   Trigger finger, right ring finger 03/16/2022   Insomnia 10/06/2021   NAFLD  (nonalcoholic fatty liver disease) 93/92/7976   Protrusion of cervical intervertebral disc 10/21/2020   Spinal stenosis of cervical region 10/21/2020   Chronic left shoulder pain 07/07/2020   Dyslipidemia associated with type 2 diabetes mellitus (HCC) 09/06/2017   Diverticulosis of colon without hemorrhage 03/09/2015   Hyperlipidemia 03/09/2015   Gastroesophageal reflux disease without esophagitis 03/09/2015   Hypertension associated with diabetes (HCC) 04/01/2013   Past Medical History:  Diagnosis Date   Allergy    Asthma    Bronchitis    Cataract    Diverticulosis    DM2 (diabetes mellitus, type 2) (HCC)    GERD (gastroesophageal reflux disease)    Heart murmur    Hyperplastic colon polyp    Hypertension     Family History  Problem Relation Age of Onset   Cancer Mother        ovarian cancer   Heart disease Mother    Liver disease Mother    Hyperlipidemia Mother    Stroke Mother    Cancer Father        lung cancer   Cancer Sister    Hyperlipidemia Sister    Cancer Sister    Hyperlipidemia Sister    Diabetes Maternal Aunt    Colon cancer Maternal Aunt        x2 aunts   Colon cancer Maternal Uncle        2 uncles   Colon cancer Paternal Grandfather    Esophageal cancer Neg Hx    Rectal cancer Neg Hx    Stomach cancer Neg Hx     Past Surgical History:  Procedure Laterality Date   CESAREAN SECTION     COLONOSCOPY     ESOPHAGOGASTRODUODENOSCOPY     TONSILLECTOMY     Social History   Occupational History   Occupation: Programmer, Systems  Tobacco Use   Smoking status: Never   Smokeless tobacco: Never  Vaping Use   Vaping status: Never Used  Substance and Sexual Activity   Alcohol use: No   Drug use: No   Sexual activity: Yes

## 2024-01-04 ENCOUNTER — Ambulatory Visit: Admitting: Physician Assistant

## 2024-01-04 ENCOUNTER — Other Ambulatory Visit: Payer: Self-pay

## 2024-01-04 ENCOUNTER — Other Ambulatory Visit (INDEPENDENT_AMBULATORY_CARE_PROVIDER_SITE_OTHER): Payer: Self-pay

## 2024-01-04 DIAGNOSIS — M25512 Pain in left shoulder: Secondary | ICD-10-CM | POA: Diagnosis not present

## 2024-01-04 DIAGNOSIS — M25511 Pain in right shoulder: Secondary | ICD-10-CM

## 2024-01-04 DIAGNOSIS — G8929 Other chronic pain: Secondary | ICD-10-CM | POA: Diagnosis not present

## 2024-01-04 DIAGNOSIS — M2550 Pain in unspecified joint: Secondary | ICD-10-CM | POA: Diagnosis not present

## 2024-01-04 MED ORDER — ACETAMINOPHEN-CODEINE 300-30 MG PO TABS: 1.0000 | ORAL_TABLET | ORAL | 0 refills | Status: AC | PRN

## 2024-01-04 MED ORDER — METHOCARBAMOL 500 MG PO TABS: 500.0000 mg | ORAL_TABLET | Freq: Four times a day (QID) | ORAL | 0 refills | Status: AC | PRN

## 2024-01-05 ENCOUNTER — Encounter: Payer: Self-pay | Admitting: Physician Assistant

## 2024-01-05 ENCOUNTER — Other Ambulatory Visit: Payer: Self-pay | Admitting: Emergency Medicine

## 2024-01-05 DIAGNOSIS — E1159 Type 2 diabetes mellitus with other circulatory complications: Secondary | ICD-10-CM

## 2024-01-05 DIAGNOSIS — I10 Essential (primary) hypertension: Secondary | ICD-10-CM

## 2024-01-05 DIAGNOSIS — E782 Mixed hyperlipidemia: Secondary | ICD-10-CM

## 2024-01-05 NOTE — Progress Notes (Signed)
 Office Visit Note   Patient: Erin Benitez           Date of Birth: [birth year]           MRN: [id number] Visit Date: 01/04/2024              Requested by: Erin Emil Schanz, MD [address] [place],  [place] [address] PCP: Erin Emil Schanz, MD  Chief Complaint  Patient presents with   Right Shoulder - Pain   Left Shoulder - Pain   Neck - Pain   Left Hand - Pain   Right Hand - Pain      HPI: Erin Benitez is a pleasant 73 year old woman who I have seen in the past for bilateral shoulder pain.  She has had injections subacromial in the past with [name] and Erin Benitez.  At her last appointment she was asking for injections.  Unfortunately 6 weeks prior she had had an hemoglobin A1c of 10.1.  I explained to her at that time that I could not inject her shoulder she comes in today with multiple joint pain mostly in the shoulder.  Assessment & Plan: Visit Diagnoses:  1. Chronic pain of both shoulders   2. Arthralgia of multiple joints     Plan: I had a long discussion with the patient today.  I will give her a few Tylenol  with codeine  and she is to take this sparingly.  She does have an appointment with her primary care to reevaluate her diabetes.  I at the last appointment had told her that I really wanted her to try physical therapy because I was concerned she was developing an adhesive capsulitis.  She was doing exercises on her own but now is agreed to go to therapy.  She also discusses her trigger finger issues.  She did I defer this to [name] who has been treating her for this.  May follow-up with me as needed but I did tell her to make an appointment as soon as possible with Erin Benitez or [name]  Follow-Up Instructions: Return if symptoms worsen or fail to improve.   Ortho Exam  Patient is alert, oriented, no adenopathy, well-dressed, normal affect, normal respiratory effort. Examination of her bilateral shoulders difficult secondary to pain and stiffness in both of her  shoulders.  She has some stiffness in her neck as well neurologically she is grossly intact.  Grip strength is intact sensation is intact strength is difficult to assess because of her pain level.  No evidence of any infective process.    Imaging: XR Shoulder Right Result Date: 01/05/2024 Degeneration of the acromioclavicular joint no acute osseous changes  XR Shoulder Left Result Date: 01/05/2024 No acute changes.  She does have some degeneration to the [facility] joint no evidence of  No images are attached to the encounter.  Labs: Lab Results  Component Value Date   HGBA1C 10.1 (A) 10/30/2023   HGBA1C 10.4 (A) 04/05/2023   HGBA1C 7.3 (A) 01/05/2023   ESRSEDRATE 46 (H) 10/30/2023     Lab Results  Component Value Date   ALBUMIN 4.7 10/30/2023   ALBUMIN 4.8 04/05/2023   ALBUMIN 4.7 07/06/2022    No results found for: MG No results found for: VD25OH  No results found for: PREALBUMIN    Latest Ref Rng & Units 09/26/2023    4:42 AM 04/05/2023    9:36 AM 07/06/2022    8:37 AM  CBC EXTENDED  WBC 4.0 - 10.5 K/uL 3.9  4.1  4.5   RBC 3.87 - 5.11 MIL/uL 4.72  5.09  4.56   Hemoglobin 12.0 - 15.0 g/dL 86.7  85.6  87.2   HCT 36.0 - 46.0 % 40.5  44.4  39.3   Platelets 150 - 400 K/uL 200  237.0  250.0   NEUT# 1.7 - 7.7 K/uL 1.9  1.7  2.4   Lymph# 0.7 - 4.0 K/uL 1.4  1.7  1.5      There is no height or weight on file to calculate BMI.  Orders:  Orders Placed This Encounter  Procedures   XR Shoulder Right   XR Shoulder Left   Ambulatory referral to Physical Therapy   Meds ordered this encounter  Medications   acetaminophen -codeine  (TYLENOL  #3) 300-30 MG tablet    Sig: Take 1-2 tablets by mouth every 4 (four) hours as needed for moderate pain (pain score 4-6).    Dispense:  20 tablet    Refill:  0   methocarbamol  (ROBAXIN ) 500 MG tablet    Sig: Take 1 tablet (500 mg total) by mouth every 6 (six) hours as needed for muscle spasms.    Dispense:  30 tablet    Refill:  0      Procedures: No procedures performed  Clinical Data: No additional findings.  ROS:  All other systems negative, except as noted in the HPI. Review of Systems  Objective: Vital Signs: There were no vitals taken for this visit.  Specialty Comments:  No specialty comments available.  PMFS History: Patient Active Problem List   Diagnosis Date Noted   Bilateral shoulder pain 12/22/2023   Arthralgia of multiple joints 10/30/2023   Acute cough 10/30/2023   Aortic atherosclerosis 04/05/2023   Trigger finger, right ring finger 03/16/2022   Insomnia 10/06/2021   NAFLD (nonalcoholic fatty liver disease) 93/92/7976   Protrusion of cervical intervertebral disc 10/21/2020   Spinal stenosis of cervical region 10/21/2020   Chronic left shoulder pain 07/07/2020   Dyslipidemia associated with type 2 diabetes mellitus (HCC) 09/06/2017   Diverticulosis of colon without hemorrhage 03/09/2015   Hyperlipidemia 03/09/2015   Gastroesophageal reflux disease without esophagitis 03/09/2015   Hypertension associated with diabetes (HCC) 04/01/2013   Past Medical History:  Diagnosis Date   Allergy    Asthma    Bronchitis    Cataract    Diverticulosis    DM2 (diabetes mellitus, type 2) (HCC)    GERD (gastroesophageal reflux disease)    Heart murmur    Hyperplastic colon polyp    Hypertension     Family History  Problem Relation Age of Onset   Cancer Mother        ovarian cancer   Heart disease Mother    Liver disease Mother    Hyperlipidemia Mother    Stroke Mother    Cancer Father        lung cancer   Cancer Sister    Hyperlipidemia Sister    Cancer Sister    Hyperlipidemia Sister    Diabetes Maternal Aunt    Colon cancer Maternal Aunt        x2 aunts   Colon cancer Maternal Uncle        2 uncles   Colon cancer Paternal Grandfather    Esophageal cancer Neg Hx    Rectal cancer Neg Hx    Stomach cancer Neg Hx     Past Surgical History:  Procedure Laterality Date    CESAREAN SECTION     COLONOSCOPY  ESOPHAGOGASTRODUODENOSCOPY     TONSILLECTOMY     Social History   Occupational History   Occupation: Programmer, Systems  Tobacco Use   Smoking status: Never   Smokeless tobacco: Never  Vaping Use   Vaping status: Never Used  Substance and Sexual Activity   Alcohol use: No   Drug use: No   Sexual activity: Yes

## 2024-01-08 ENCOUNTER — Ambulatory Visit: Payer: Self-pay

## 2024-01-08 ENCOUNTER — Encounter: Payer: Self-pay | Admitting: Emergency Medicine

## 2024-01-08 ENCOUNTER — Ambulatory Visit: Admitting: Emergency Medicine

## 2024-01-08 NOTE — Telephone Encounter (Signed)
 FYI Only or Action Required?: Action required by provider: update on patient condition. Requesting refill of Cough syrup- pharmacy confirmed   Patient was last seen in primary care on 12/20/2023 by [name], MD.  Called Nurse Triage reporting Cough.  Symptoms began several days ago.  Interventions attempted: Prescription medications: promethazine  DM.  Symptoms are: gradually worsening.  Triage Disposition: See Physician Within 24 Hours  Patient/caregiver understands and will follow disposition?: No, wishes to speak with PCP Copied from CRM #[id number]. Topic: Clinical - Red Word Triage >> Jan 08, 2024  4:56 PM Rea ORN wrote: Red Word that prompted transfer to Nurse Triage: Cough for the past week that is not going away. Reason for Disposition  [1] Continuous (nonstop) coughing interferes with work or school AND [2] no improvement using cough treatment per Care Advice  Answer Assessment - Initial Assessment Questions Coughing worse at night- sleeping propped up. Tessalon  never helps her. Cough syrup prescribed (phenergan  DM) worked great last time. Only productive at night. Dry hacky cough during the day.  Denies SOB, CP, dizziness, fever, sore throat.   Requesting refill of Phenergan  DM  1. ONSET: When did the cough begin?      The last few days  2. SEVERITY: How bad is the cough today?      Frustrating and upseting her day to day 3. SPUTUM: Describe the color of your sputum (e.g., none, dry cough; clear, white, yellow, green)     Clear mucous at night  4. HEMOPTYSIS: Are you coughing up any blood? If Yes, ask: How much? (e.g., flecks, streaks, tablespoons, etc.)     denies 5. DIFFICULTY BREATHING: Are you having difficulty breathing? If Yes, ask: How bad is it? (e.g., mild, moderate, severe)      denies 6. FEVER: Do you have a fever? If Yes, ask: What is your temperature, how was it measured, and when did it start?     Denies  7. CARDIAC HISTORY: Do  you have any history of heart disease? (e.g., heart attack, congestive heart failure)      HTN  8. LUNG HISTORY: Do you have any history of lung disease?  (e.g., pulmonary embolus, asthma, emphysema)     Denies  9. PE RISK FACTORS: Do you have a history of blood clots? (or: recent major surgery, recent prolonged travel, bedridden)     Denies  10. OTHER SYMPTOMS: Do you have any other symptoms? (e.g., runny nose, wheezing, chest pain)       Denies  Protocols used: Cough - Acute Productive-A-AH

## 2024-01-09 NOTE — Telephone Encounter (Signed)
 Please advise

## 2024-01-09 NOTE — Telephone Encounter (Signed)
Needs to be seen for reevaluation

## 2024-01-10 ENCOUNTER — Telehealth: Payer: Self-pay | Admitting: Orthopedic Surgery

## 2024-01-10 NOTE — Telephone Encounter (Signed)
[  T called saying that her hands aren't getting any better, they are getting worse and would like to be advised as to what to do. She knows she has an appt for pt on 12/23. Call back number is [phone]

## 2024-01-17 ENCOUNTER — Telehealth: Payer: Self-pay

## 2024-01-17 NOTE — Telephone Encounter (Signed)
 Patient was identified as falling into the [place] Measure - Diabetes.   Patient was: Appointment already scheduled for:  01/29/24.

## 2024-01-19 NOTE — Progress Notes (Unsigned)
 Bilateral hand pain; S/p left ring finger trigger digit release 01/19/24

## 2024-01-22 ENCOUNTER — Ambulatory Visit: Admitting: Orthopedic Surgery

## 2024-01-22 DIAGNOSIS — R200 Anesthesia of skin: Secondary | ICD-10-CM

## 2024-01-22 DIAGNOSIS — R202 Paresthesia of skin: Secondary | ICD-10-CM

## 2024-01-23 ENCOUNTER — Ambulatory Visit: Admitting: Rehabilitative and Restorative Service Providers"

## 2024-01-29 ENCOUNTER — Ambulatory Visit: Admitting: Emergency Medicine

## 2024-01-29 ENCOUNTER — Telehealth: Payer: Self-pay

## 2024-01-29 ENCOUNTER — Encounter: Payer: Self-pay | Admitting: Emergency Medicine

## 2024-01-29 VITALS — BP 140/80 | HR 67 | Temp 98.0°F | Ht 64.0 in | Wt 146.0 lb

## 2024-01-29 DIAGNOSIS — E785 Hyperlipidemia, unspecified: Secondary | ICD-10-CM | POA: Diagnosis not present

## 2024-01-29 DIAGNOSIS — Z7984 Long term (current) use of oral hypoglycemic drugs: Secondary | ICD-10-CM

## 2024-01-29 DIAGNOSIS — M2550 Pain in unspecified joint: Secondary | ICD-10-CM | POA: Diagnosis not present

## 2024-01-29 DIAGNOSIS — I152 Hypertension secondary to endocrine disorders: Secondary | ICD-10-CM | POA: Diagnosis not present

## 2024-01-29 DIAGNOSIS — R053 Chronic cough: Secondary | ICD-10-CM | POA: Insufficient documentation

## 2024-01-29 DIAGNOSIS — E1159 Type 2 diabetes mellitus with other circulatory complications: Secondary | ICD-10-CM

## 2024-01-29 DIAGNOSIS — E1169 Type 2 diabetes mellitus with other specified complication: Secondary | ICD-10-CM | POA: Diagnosis not present

## 2024-01-29 DIAGNOSIS — K219 Gastro-esophageal reflux disease without esophagitis: Secondary | ICD-10-CM

## 2024-01-29 LAB — POCT GLYCOSYLATED HEMOGLOBIN (HGB A1C): HbA1c POC (<> result, manual entry): 11 %

## 2024-01-29 MED ORDER — SITAGLIPTIN PHOSPHATE 50 MG PO TABS: 50.0000 mg | ORAL_TABLET | Freq: Every day | ORAL | 3 refills | Status: AC

## 2024-01-29 MED ORDER — BENZONATATE 200 MG PO CAPS: 200.0000 mg | ORAL_CAPSULE | Freq: Two times a day (BID) | ORAL | 0 refills | Status: AC | PRN

## 2024-01-29 MED ORDER — FREESTYLE LIBRE 3 SENSOR MISC: 5 refills | Status: AC

## 2024-01-29 MED ORDER — HYDROCODONE BIT-HOMATROP MBR 5-1.5 MG/5ML PO SOLN: 5.0000 mL | Freq: Every evening | ORAL | 0 refills | Status: AC | PRN

## 2024-01-29 MED ORDER — EMPAGLIFLOZIN 25 MG PO TABS: 25.0000 mg | ORAL_TABLET | Freq: Every day | ORAL | 3 refills | Status: AC

## 2024-01-29 NOTE — Assessment & Plan Note (Signed)
 Negative recent rheumatology blood work Recommend daily meloxicam  15 mg for 10 days Tylenol  with codeine  for breakthrough pains May need rheumatology referral in the near future

## 2024-01-29 NOTE — Assessment & Plan Note (Signed)
 Uncontrolled diabetes with hemoglobin A1c at 11 Increase Jardiance  to 25 mg daily and continue glipizide  10 mg twice a day.  Start Januvia  50 mg daily Not taking Rybelsus  due to cost. Cardiovascular risks associated with diabetes and dyslipidemia discussed Diet and nutrition discussed Continue rosuvastatin  40 mg daily

## 2024-01-29 NOTE — Telephone Encounter (Signed)
 Called patient on behalf of provider. Provider wanted me to look into why patient has not been seen with the pharmacist since there was a referral placed back in september. I notice that there was an appointment scheduled but look like patient ,ay have rescheduled through my chart due to conflict so I have called and informed the patient in regaurds  to this and also ask that she call the office to get rescheduled.

## 2024-01-29 NOTE — Progress Notes (Signed)
 Erin Benitez 73 y.o.   Chief Complaint  Patient presents with   Follow-up    Pt states that she has been having a cough for about 3 weeks. She is also having bilateral shoulder and arm pain    HISTORY OF PRESENT ILLNESS: This is a 73 y.o. female here for 73-month follow-up of chronic medical conditions Has the following complaints: 1.  Cough for 2 to [redacted] weeks along with runny nose and congestion.  Slowly getting better but still having persistent cough 2.  Uncontrolled diabetes.  Did not follow instructions from last visit.  Only taking glipizide  twice a day.  Not taking Jardiance .  Intolerant to metformin . 3.  Diffuse joint pains.  Sees orthopedist on a regular basis.  Saw orthopedics last week and discussed carpal tunnel syndrome bilaterally and treatment options. Chronic pain.  Affecting quality of life. No other complaints or medical concerns today. BP Readings from Last 3 Encounters:  01/29/24 (!) 140/80  12/20/23 (!) 160/80  11/02/23 (!) 144/79   Lab Results  Component Value Date   HGBA1C 10.1 (A) 10/30/2023   Wt Readings from Last 3 Encounters:  01/29/24 146 lb (66.2 kg)  12/20/23 151 lb (68.5 kg)  10/30/23 148 lb 3.2 oz (67.2 kg)     HPI   Prior to Admission medications  Medication Sig Start Date End Date Taking? Authorizing Provider  acetaminophen -codeine  (TYLENOL  #3) 300-30 MG tablet Take 1-2 tablets by mouth every 4 (four) hours as needed for moderate pain (pain score 4-6). 01/04/24   Persons, Ronal Dragon, PA  amLODipine  (NORVASC ) 10 MG tablet Take 1 tablet by mouth once daily 01/05/24   Keshan Reha Jose, MD  benzonatate  (TESSALON ) 200 MG capsule Take 1 capsule (200 mg total) by mouth 2 (two) times daily as needed for cough. 10/30/23   Brynda Heick Jose, MD  empagliflozin  (JARDIANCE ) 25 MG TABS tablet Take 1 tablet (25 mg total) by mouth daily. 10/30/23   [name], MD  glipiZIDE  (GLUCOTROL ) 10 MG tablet TAKE 1 TABLET BY MOUTH ONCE DAILY BEFORE  BREAKFAST 12/10/23   [name], MD  Glucagon  (GVOKE HYPOPEN  2-PACK) 0.5 MG/0.1ML SOAJ Inject 0.5 mg into the skin as needed. 10/06/22   Keiri Solano Jose, MD  hydrochlorothiazide  (HYDRODIURIL ) 25 MG tablet Take 1 tablet (25 mg total) by mouth daily. 05/30/23 12/20/23  [name], [name], MD  meloxicam  (MOBIC ) 15 MG tablet Take 1 tablet (15 mg total) by mouth daily. 01/22/24   Agarwala, Gildardo, MD  metFORMIN  (GLUCOPHAGE ) 500 MG tablet Take 1 tablet (500 mg total) by mouth 2 (two) times daily with a meal. 10/30/23   Rhiley Solem, Emil Schanz, MD  methocarbamol  (ROBAXIN ) 500 MG tablet Take 1 tablet (500 mg total) by mouth every 6 (six) hours as needed for muscle spasms. 01/04/24   Persons, Ronal Dragon, [place]  metoprolol  succinate (TOPROL -XL) 25 MG 24 hr tablet Take 1 tablet by mouth once daily 01/05/24   Nataly Pacifico Jose, MD  pantoprazole  (PROTONIX ) 40 MG tablet Take 1 tablet (40 mg total) by mouth daily. 06/13/23   [name], MD  potassium chloride  (KLOR-CON ) 10 MEQ tablet Take 1 tablet (10 mEq total) by mouth 2 (two) times daily. 05/30/23   [name], [name], MD  promethazine -dextromethorphan (PROMETHAZINE -DM) 6.25-15 MG/5ML syrup Take 5 mLs by mouth 4 (four) times daily as needed for cough. 11/01/23   [name], MD  rosuvastatin  (CRESTOR ) 40 MG tablet Take 1 tablet by mouth once daily 01/05/24  Alysha Doolan Jose, MD  Semaglutide  7 MG TABS Take 1 tablet (7 mg total) by mouth daily. 10/06/22   Troi Bechtold Jose, MD  zolpidem  (AMBIEN ) 10 MG tablet Take 1 tablet (10 mg total) by mouth at bedtime as needed for sleep. 06/19/23 12/20/23  [name], MD    Allergies[1]  Patient Active Problem List   Diagnosis Date Noted   Bilateral shoulder pain 12/22/2023   Arthralgia of multiple joints 10/30/2023   Acute cough 10/30/2023   Aortic atherosclerosis 04/05/2023   Trigger finger, right ring finger 03/16/2022   Insomnia 10/06/2021   NAFLD (nonalcoholic fatty liver  disease) 07/07/2021   Protrusion of cervical intervertebral disc 10/21/2020   Spinal stenosis of cervical region 10/21/2020   Chronic left shoulder pain 07/07/2020   Dyslipidemia associated with type 2 diabetes mellitus (HCC) 09/06/2017   Diverticulosis of colon without hemorrhage 03/09/2015   Hyperlipidemia 03/09/2015   Gastroesophageal reflux disease without esophagitis 03/09/2015   Hypertension associated with diabetes (HCC) 04/01/2013    Past Medical History:  Diagnosis Date   Allergy    Asthma    Bronchitis    Cataract    Diverticulosis    DM2 (diabetes mellitus, type 2) (HCC)    GERD (gastroesophageal reflux disease)    Heart murmur    Hyperplastic colon polyp    Hypertension     Past Surgical History:  Procedure Laterality Date   CESAREAN SECTION     COLONOSCOPY     ESOPHAGOGASTRODUODENOSCOPY     TONSILLECTOMY      Social History   Socioeconomic History   Marital status: Single    Spouse name: Not on file   Number of children: 1   Years of education: Not on file   Highest education level: Associate degree: occupational, scientist, product/process development, or vocational program  Occupational History   Occupation: Programmer, Systems  Tobacco Use   Smoking status: Never   Smokeless tobacco: Never  Vaping Use   Vaping status: Never Used  Substance and Sexual Activity   Alcohol use: No   Drug use: No   Sexual activity: Yes  Other Topics Concern   Not on file  Social History Narrative   She reports she is single. She has 1 daughter. She is an programmer, systems.  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     2 caffeinated beverages daily.   No alcohol tobacco or drug use.   Social Drivers of Health   Tobacco Use: Low Risk (01/29/2024)   Patient History    Smoking Tobacco Use: Never    Smokeless Tobacco Use: Never    Passive Exposure: Not on file  Financial  Resource Strain: Patient Declined (12/19/2023)   Overall Financial Resource Strain (CARDIA)    Difficulty of Paying Living Expenses: Patient declined  Recent Concern: Physicist, Medical Strain - High Risk (10/17/2023)   Overall Financial Resource Strain (CARDIA)    Difficulty of Paying Living Expenses: Hard  Food Insecurity: Patient Declined (12/19/2023)   Epic    Worried About Programme Researcher, Broadcasting/film/video in the Last Year: Patient declined    Barista in the Last Year: Patient declined  Recent Concern: Food Insecurity - Food Insecurity Present (10/17/2023)   Epic    Worried About Programme Researcher, Broadcasting/film/video in the Last Year: Sometimes true    [employer] of Food in the Last  Year: Sometimes true  Transportation Needs: Patient Declined (12/19/2023)   Epic    Lack of Transportation (Medical): Patient declined    Lack of Transportation (Non-Medical): Patient declined  Physical Activity: Sufficiently Active (12/19/2023)   Exercise Vital Sign    Days of Exercise per Week: 4 days    Minutes of Exercise per Session: 40 min  Stress: Stress Concern Present (12/19/2023)   [employer] of Occupational Health - Occupational Stress Questionnaire    Feeling of Stress: To some extent  Social Connections: Unknown (12/19/2023)   Social Connection and Isolation Panel    Frequency of Communication with Friends and Family: More than three times a week    Frequency of Social Gatherings with Friends and Family: More than three times a week    Attends Religious Services: More than 4 times per year    Active Member of Clubs or Organizations: Yes    Attends Banker Meetings: More than 4 times per year    Marital Status: Patient declined  Intimate Partner Violence: Not At Risk (10/17/2023)   Epic    Fear of Current or Ex-Partner: No    Emotionally Abused: No    Physically Abused: No    Sexually Abused: No  Depression (PHQ2-9): Medium Risk (10/30/2023)   Depression (PHQ2-9)    PHQ-2 Score: 7  Alcohol  Screen: Low Risk (10/17/2023)   Alcohol Screen    Last Alcohol Screening Score (AUDIT): 0  Housing: Unknown (12/19/2023)   Epic    Unable to Pay for Housing in the Last Year: Patient declined    Number of Times Moved in the Last Year: 0    Homeless in the Last Year: No  Utilities: Not At Risk (10/17/2023)   Epic    Threatened with loss of utilities: No  Health Literacy: Adequate Health Literacy (10/17/2023)   B1300 Health Literacy    Frequency of need for help with medical instructions: Never    Family History  Problem Relation Age of Onset   Cancer Mother        ovarian cancer   Heart disease Mother    Liver disease Mother    Hyperlipidemia Mother    Stroke Mother    Cancer Father        lung cancer   Cancer Sister    Hyperlipidemia Sister    Cancer Sister    Hyperlipidemia Sister    Diabetes Maternal Aunt    Colon cancer Maternal Aunt        x2 aunts   Colon cancer Maternal Uncle        2 uncles   Colon cancer Paternal Grandfather    Esophageal cancer Neg Hx    Rectal cancer Neg Hx    Stomach cancer Neg Hx      Review of Systems  Constitutional: Negative.  Negative for chills and fever.  HENT: Negative.  Negative for congestion and sore throat.   Respiratory:  Positive for cough. Negative for hemoptysis, sputum production, shortness of breath and wheezing.   Cardiovascular: Negative.  Negative for chest pain and palpitations.  Gastrointestinal:  Negative for abdominal pain, diarrhea, nausea and vomiting.  Genitourinary: Negative.  Negative for dysuria and hematuria.  Musculoskeletal:  Positive for joint pain.  Skin: Negative.  Negative for rash.  Neurological: Negative.  Negative for dizziness and headaches.  All other systems reviewed and are negative.   Today's Vitals   01/29/24 0804 01/29/24 0808  BP: (!) 140/80 (!) 140/80  Pulse: 67  Temp: 98 F (36.7 C)   TempSrc: Oral   SpO2: 96%   Weight: 146 lb (66.2 kg)   Height: 5' 4 (1.626 m)    Body mass  index is 25.06 kg/m.   Physical Exam Vitals reviewed.  Constitutional:      Appearance: Normal appearance.  HENT:     Head: Normocephalic.  Eyes:     Extraocular Movements: Extraocular movements intact.  Cardiovascular:     Rate and Rhythm: Normal rate.  Pulmonary:     Effort: Pulmonary effort is normal.  Skin:    General: Skin is warm and dry.     Capillary Refill: Capillary refill takes less than 2 seconds.  Neurological:     General: No focal deficit present.     Mental Status: She is alert and oriented to person, place, and time.  Psychiatric:        Mood and Affect: Mood normal.        Behavior: Behavior normal.    Results for orders placed or performed in visit on 01/29/24 (from the past 24 hours)  POCT HgB A1C     Status: Abnormal   Collection Time: 01/29/24  8:19 AM  Result Value Ref Range   Hemoglobin A1C     HbA1c POC (<> result, manual entry) 11.0 4.0 - 5.6 %   HbA1c, POC (prediabetic range)     HbA1c, POC (controlled diabetic range)        ASSESSMENT & PLAN: A total of 40 minutes was spent with the patient and counseling/coordination of care regarding preparing for this visit, review of most recent office visit notes, review of multiple chronic medical conditions and their management, cardiovascular risks associated with uncontrolled diabetes, review of all medications and changes made, review of most recent bloodwork results including interpretation of today's hemoglobin A1c, pain management and need for orthopedic follow-up, review of health maintenance items, education on nutrition, prognosis, documentation, and need for follow up.   Problem List Items Addressed This Visit       Cardiovascular and Mediastinum   Hypertension associated with diabetes (HCC) - Primary   Blood pressure still elevated in the office Presently on amlodipine  10 mg daily and hydrochlorothiazide  25 mg daily Also taking metoprolol  succinate 25 mg daily Allergic to ACE  inhibitors Uncontrolled diabetes with hemoglobin A1c of 11 Only taking glipizide  10 mg twice a day Intolerant to metformin  Not taking Rybelsus  due to cost Not taking Jardiance .  Has been on Jardiance  before. Recommend to start Jardiance  25 mg daily and Januvia  50 mg daily Cardiovascular risks associated with uncontrolled diabetes discussed Recommend evaluation by clinical pharmacist Diet and nutrition discussed Follow-up in 3 months      Relevant Medications   empagliflozin  (JARDIANCE ) 25 MG TABS tablet   sitaGLIPtin  (JANUVIA ) 50 MG tablet   Continuous Glucose Sensor (FREESTYLE LIBRE 3 SENSOR) MISC   Other Relevant Orders   POCT HgB A1C (Completed)     Digestive   Gastroesophageal reflux disease without esophagitis     Endocrine   Dyslipidemia associated with type 2 diabetes mellitus (HCC)   Uncontrolled diabetes with hemoglobin A1c at 11 Increase Jardiance  to 25 mg daily and continue glipizide  10 mg twice a day.  Start Januvia  50 mg daily Not taking Rybelsus  due to cost. Cardiovascular risks associated with diabetes and dyslipidemia discussed Diet and nutrition discussed Continue rosuvastatin  40 mg daily      Relevant Medications   empagliflozin  (JARDIANCE ) 25 MG TABS tablet   sitaGLIPtin  (JANUVIA )  50 MG tablet   Continuous Glucose Sensor (FREESTYLE LIBRE 3 SENSOR) MISC     Other   Arthralgia of multiple joints   Negative recent rheumatology blood work Recommend daily meloxicam  15 mg for 10 days Tylenol  with codeine  for breakthrough pains May need rheumatology referral in the near future      Persistent cough   Most likely viral respiratory infection Continue over-the-counter Mucinex DM Hycodan syrup at bedtime Start Tessalon  200 mg 3 times a day      Relevant Medications   benzonatate  (TESSALON ) 200 MG capsule   HYDROcodone  bit-homatropine (HYCODAN) 5-1.5 MG/5ML syrup   Patient Instructions  Diabetes: Carbohydrate Counting for Adults Carbohydrate counting  is a method of keeping track of how many carbohydrates you eat. Eating carbohydrates increases the amount of sugar, also called glucose, in your blood. By counting how many carbohydrates you eat, you can improve how well you manage your blood sugar. This, in turn, helps you manage your diabetes. Carbohydrates are measured in grams (g) per serving. It's important to know how many carbohydrates (in grams or by serving size) you can have in each meal. This is different for every person. A dietitian can help you make a meal plan and calculate how many carbohydrates you should have at each meal and snack. What foods contain carbohydrates? Carbohydrates are found in these foods: Grains, such as breads and cereals. Dried beans and soy products. Starchy vegetables, such as potatoes, peas, and corn. Fruit and fruit juices. Milk and yogurt. Sweets and snack foods like cake, cookies, candy, chips, and soft drinks. How do I count carbohydrates in foods? There are two ways to count carbohydrates in food. You can read food labels or learn standard serving sizes of foods. You can use either of these methods or a combination of both. Using the Nutrition Facts label The Nutrition Facts list is included on the labels of almost all packaged foods and drinks in the [place] It includes: The serving size. Information about nutrients in each serving. This includes the grams of carbohydrate per serving. To use the Nutrition Facts, decide how many servings you will have. Then, multiply the number of servings by the number of carbohydrates per serving. The resulting number is the total grams of carbohydrates that you'll be having. Learning the standard serving sizes of foods When you eat carbohydrate foods that aren't packaged or don't include Nutrition Facts on the label, you need to measure the servings in order to count the grams of carbohydrates. Measure the foods that you'll eat with a food scale or measuring cup, if  needed. Decide how many standard-size servings you'll eat. Multiply the number of servings by 15. For foods that contain carbohydrates, one serving equals 15 g of carbohydrates. For example, if you eat 2 cups or 10 oz (300 g) of strawberries, you'll have eaten 2 servings and 30 g of carbohydrates (2 servings x 15 g = 30 g). For foods that have more than one food mixed, such as soups and casseroles, you must count the carbohydrates in each food that's included. Here's a list of standard serving sizes for common carbohydrate-rich foods. Each of these servings has about 15 g of carbohydrates: 1 slice of bread. 1 six-inch (15 cm) tortilla. ? cup or 2 oz (53 g) of cooked rice or pasta.  cup or 3 oz (85 g) of cooked or canned, drained, and rinsed beans or lentils.  cup or 3 oz (85 g) of a starchy vegetable, such as peas,  corn, or squash.  cup or 4 oz (120 g) of hot cereal.  cup or 3 oz (85 g) of boiled or mashed potatoes, or  or 3 oz (85 g) of a large baked potato.  cup or 4 fl oz (118 mL) of fruit juice. 1 cup or 8 fl oz (237 mL) of milk. 1 small or 4 oz (106 g) apple.  or 2 oz (63 g) of a medium banana. 1 cup or 5 oz (150 g) of strawberries. 3 cups or 1 oz (28.3 g) of popped popcorn. What is an example of carbohydrate counting? To calculate the grams of carbohydrates in this sample meal, follow the steps below. Sample meal 3 oz (85 g) chicken breast. ? cup or 4 oz (106 g) of brown rice.  cup or 3 oz (85 g) of corn. 1 cup or 8 fl oz (237 mL) of milk. 1 cup or 5 oz (150 g) of strawberries with sugar-free whipped topping. Carbohydrate calculation Identify the foods that have carbohydrates: Rice. Corn. Milk. Strawberries. Calculate how many servings you have of each food: 2 servings of rice. 1 serving of corn. 1 serving of milk. 1 serving of strawberries. Multiply each number of servings by 15 g: 2 servings of rice x 15 g = 30 g. 1 serving of corn x 15 g = 15 g. 1 serving of  milk x 15 g = 15 g. 1 serving of strawberries x 15 g = 15 g. Add together all of the amounts to find the total grams of carbohydrates eaten: 30 g + 15 g + 15 g + 15 g = 75 g of carbohydrates total. Where to find more information To learn more, go to: American Diabetes Association at diabetes.org. Click Search and type carb counting. Find the link you need. Centers for Disease Control and Prevention at tonerpromos.no. Click Search and type diabetes. Find the link you need. Academy of Nutrition and Dietetics: eatright.org This information is not intended to replace advice given to you by your health care provider. Make sure you discuss any questions you have with your health care provider. Document Revised: 01/04/2023 Document Reviewed: 01/04/2023 Elsevier Patient Education  2025 Elsevier Inc.     Emil Schaumann, MD [facility]     [1]  Allergies Allergen Reactions   Lisinopril  Swelling   Peanut-Containing Drug Products Anaphylaxis, Hives, Swelling and Other (See Comments)    Pistachio's - swelling of the throat      Tramadol  Nausea And Vomiting    Headache & dizziness with n&v

## 2024-01-29 NOTE — Assessment & Plan Note (Signed)
 Most likely viral respiratory infection Continue over-the-counter Mucinex DM Hycodan syrup at bedtime Start Tessalon  200 mg 3 times a day

## 2024-01-29 NOTE — Patient Instructions (Signed)

## 2024-01-29 NOTE — Assessment & Plan Note (Signed)
 Blood pressure still elevated in the office Presently on amlodipine  10 mg daily and hydrochlorothiazide  25 mg daily Also taking metoprolol  succinate 25 mg daily Allergic to ACE inhibitors Uncontrolled diabetes with hemoglobin A1c of 11 Only taking glipizide  10 mg twice a day Intolerant to metformin  Not taking Rybelsus  due to cost Not taking Jardiance .  Has been on Jardiance  before. Recommend to start Jardiance  25 mg daily and Januvia  50 mg daily Cardiovascular risks associated with uncontrolled diabetes discussed Recommend evaluation by clinical pharmacist Diet and nutrition discussed Follow-up in 3 months

## 2024-02-03 ENCOUNTER — Encounter: Payer: Self-pay | Admitting: Emergency Medicine

## 2024-02-07 ENCOUNTER — Ambulatory Visit: Admitting: Physical Therapy

## 2024-02-08 ENCOUNTER — Encounter: Payer: Self-pay | Admitting: *Deleted

## 2024-02-08 NOTE — Progress Notes (Signed)
 Erin Benitez                                          MRN: [id number]   02/08/2024   The [facility] Quality Team Specialist reviewed this patient medical record for the purposes of chart review for care gap closure. The following were reviewed: chart review for care gap closure-glycemic status assessment.    [facility] Quality Team

## 2024-02-10 ENCOUNTER — Emergency Department (HOSPITAL_BASED_OUTPATIENT_CLINIC_OR_DEPARTMENT_OTHER)
Admission: EM | Admit: 2024-02-10 | Discharge: 2024-02-10 | Disposition: A | Discharge status: Home / Self Care | Attending: Emergency Medicine | Admitting: Emergency Medicine

## 2024-02-10 DIAGNOSIS — M25542 Pain in joints of left hand: Secondary | ICD-10-CM | POA: Diagnosis not present

## 2024-02-10 DIAGNOSIS — M25541 Pain in joints of right hand: Secondary | ICD-10-CM | POA: Insufficient documentation

## 2024-02-10 DIAGNOSIS — Z91010 Allergy to peanuts: Secondary | ICD-10-CM | POA: Insufficient documentation

## 2024-02-10 MED ORDER — KETOROLAC TROMETHAMINE 30 MG/ML IJ SOLN
30.0000 mg | Freq: Once | INTRAMUSCULAR | Status: AC
  Administered 2024-02-10: 30 mg via INTRAMUSCULAR
  Filled 2024-02-10: qty 1

## 2024-02-10 MED ORDER — KETOROLAC TROMETHAMINE 15 MG/ML IJ SOLN: 15.0000 mg | Freq: Once | INTRAMUSCULAR | Status: DC

## 2024-02-10 NOTE — ED Provider Notes (Signed)
 " [facility] AT [facility] Provider Note   CSN: [id number] Arrival date & time: 02/10/24  9252     Patient presents with: Shoulder Pain   Erin Benitez is a 74 y.o. female.  With a history of arthralgia of multiple joints and chronic pain who presents to the ED for pain.  For a period of months patient has experienced ongoing pain in both shoulders both hands and both legs.  She was evaluated by orthopedics last month for the pain and numbness in both hands and they had scheduled a nerve study but this was canceled due to equipment issues.  She is here for refractory pain in both hands and both shoulders despite Tylenol  and meloxicam  at home.  No new injuries or qualitative changes in her pain.    Shoulder Pain      Prior to Admission medications  Medication Sig Start Date End Date Taking? Authorizing Provider  acetaminophen -codeine  (TYLENOL  #3) 300-30 MG tablet Take 1-2 tablets by mouth every 4 (four) hours as needed for moderate pain (pain score 4-6). 01/04/24   Persons, Ronal Dragon, PA  amLODipine  (NORVASC ) 10 MG tablet Take 1 tablet by mouth once daily 01/05/24   Sagardia, Miguel Jose, MD  benzonatate  (TESSALON ) 200 MG capsule Take 1 capsule (200 mg total) by mouth 2 (two) times daily as needed for cough. 10/30/23   [name], MD  benzonatate  (TESSALON ) 200 MG capsule Take 1 capsule (200 mg total) by mouth 2 (two) times daily as needed for cough. 01/29/24   [name], MD  Continuous Glucose Sensor (FREESTYLE LIBRE 3 SENSOR) MISC Place 1 sensor on the skin every 14 days. Use to check glucose continuously 01/29/24   [name], MD  empagliflozin  (JARDIANCE ) 25 MG TABS tablet Take 1 tablet (25 mg total) by mouth daily. 01/29/24   [name], MD  glipiZIDE  (GLUCOTROL ) 10 MG tablet TAKE 1 TABLET BY MOUTH ONCE DAILY BEFORE BREAKFAST 12/10/23   Sagardia, Emil Schanz, MD  Glucagon  (GVOKE HYPOPEN  2-PACK) 0.5 MG/0.1ML SOAJ  Inject 0.5 mg into the skin as needed. 10/06/22   Sagardia, Miguel Jose, MD  hydrochlorothiazide  (HYDRODIURIL ) 25 MG tablet Take 1 tablet (25 mg total) by mouth daily. 05/30/23 12/20/23  [name], [name], MD  HYDROcodone  bit-homatropine (HYCODAN) 5-1.5 MG/5ML syrup Take 5 mLs by mouth at bedtime as needed for cough. 01/29/24   [name], MD  meloxicam  (MOBIC ) 15 MG tablet Take 1 tablet (15 mg total) by mouth daily. 01/22/24   Agarwala, Gildardo, MD  methocarbamol  (ROBAXIN ) 500 MG tablet Take 1 tablet (500 mg total) by mouth every 6 (six) hours as needed for muscle spasms. 01/04/24   Persons, Ronal Dragon, PA  metoprolol  succinate (TOPROL -XL) 25 MG 24 hr tablet Take 1 tablet by mouth once daily 01/05/24   Sagardia, Miguel Jose, MD  pantoprazole  (PROTONIX ) 40 MG tablet Take 1 tablet (40 mg total) by mouth daily. 06/13/23   [name], MD  potassium chloride  (KLOR-CON ) 10 MEQ tablet Take 1 tablet (10 mEq total) by mouth 2 (two) times daily. 05/30/23   [name], [name], MD  promethazine -dextromethorphan (PROMETHAZINE -DM) 6.25-15 MG/5ML syrup Take 5 mLs by mouth 4 (four) times daily as needed for cough. 11/01/23   [name], MD  rosuvastatin  (CRESTOR ) 40 MG tablet Take 1 tablet by mouth once daily 01/05/24   Sagardia, Miguel Jose, MD  sitaGLIPtin  (JANUVIA ) 50 MG tablet Take 1 tablet (50 mg total) by mouth daily.  01/29/24   [name], MD  zolpidem  (AMBIEN ) 10 MG tablet Take 1 tablet (10 mg total) by mouth at bedtime as needed for sleep. 06/19/23 12/20/23  [name], MD    Allergies: Lisinopril , Peanut-containing drug products, and Tramadol     Review of Systems  Updated Vital Signs BP (!) 157/98   Pulse 89   Temp 98.4 F (36.9 C) (Oral)   Resp 16   SpO2 97%   Physical Exam Vitals and nursing note reviewed.  HENT:     Head: Normocephalic and atraumatic.  Eyes:     Pupils: Pupils are equal, round, and reactive to light.  Cardiovascular:     Rate and  Rhythm: Normal rate and regular rhythm.  Pulmonary:     Effort: Pulmonary effort is normal.     Breath sounds: Normal breath sounds.  Abdominal:     Palpations: Abdomen is soft.     Tenderness: There is no abdominal tenderness.  Musculoskeletal:     Comments: 5 out of 5 motor strength bilateral hands Limited range of motion in both shoulders secondary to discomfort Sensation intact light touch throughout Good capillary refill 2+ radial pulses bilaterally  Skin:    General: Skin is warm and dry.  Neurological:     Mental Status: She is alert.  Psychiatric:        Mood and Affect: Mood normal.     (all labs ordered are listed, but only abnormal results are displayed) Labs Reviewed - No data to display  EKG: None  Radiology: No results found.   Procedures   Medications Ordered in the ED  ketorolac  (TORADOL ) 30 MG/ML injection 30 mg (30 mg Intramuscular Given 02/10/24 0858)                                    Medical Decision Making 74 year old female with history as above presenting for acute worsening of chronic multiple arthralgias including both shoulders both hands.  No new injuries or deficits on my exam.  Is undergoing evaluation from orthopedics and discussing potential nerve studies and carpal tunnel release.  Will provide dose of Toradol  IM as this has helped in the past and instruct for specialty follow-up and proceeding with nerve conduction test  Risk Prescription drug management.        Final diagnoses:  Arthralgia of hands, bilateral    ED Discharge Orders     None          Pamella Ozell LABOR, DO 02/10/24 0915  "

## 2024-02-10 NOTE — Discharge Instructions (Signed)
 You were seen in the Emergency Department for persistent pain in both hands and both shoulders This is likely an ongoing issue that will require specialty follow-up with orthopedics and for you to have your nerve study completed We gave you a dose of Toradol  to help with the inflammation and pain Follow-up with your primary doctor and specialist next week Return to the emergency department for severe pain

## 2024-02-10 NOTE — ED Triage Notes (Signed)
 Patient states neck, bilateral shoulder, hands, and leg pain for months. States seeing ortho for same. Was supposed to have a nerve study done this week but it was cancelled.

## 2024-02-12 ENCOUNTER — Encounter: Payer: Self-pay | Admitting: Orthopedic Surgery

## 2024-02-12 NOTE — Progress Notes (Unsigned)
 Follow up bilateral hands; Toradol  injection today

## 2024-02-13 ENCOUNTER — Ambulatory Visit: Admitting: Orthopedic Surgery

## 2024-02-13 ENCOUNTER — Encounter: Admitting: Physical Medicine and Rehabilitation

## 2024-02-13 DIAGNOSIS — R202 Paresthesia of skin: Secondary | ICD-10-CM

## 2024-02-13 DIAGNOSIS — R200 Anesthesia of skin: Secondary | ICD-10-CM

## 2024-02-13 DIAGNOSIS — M654 Radial styloid tenosynovitis [de Quervain]: Secondary | ICD-10-CM | POA: Diagnosis not present

## 2024-02-13 MED ORDER — LIDOCAINE HCL 1 % IJ SOLN
1.0000 mL | INTRAMUSCULAR | Status: AC | PRN
  Administered 2024-02-13: 1 mL

## 2024-02-13 MED ORDER — BETAMETHASONE SOD PHOS & ACET 6 (3-3) MG/ML IJ SUSP
6.0000 mg | INTRAMUSCULAR | Status: AC | PRN
  Administered 2024-02-13: 6 mg via INTRA_ARTICULAR

## 2024-02-27 ENCOUNTER — Other Ambulatory Visit: Payer: Self-pay | Admitting: Emergency Medicine

## 2024-02-27 DIAGNOSIS — I152 Hypertension secondary to endocrine disorders: Secondary | ICD-10-CM

## 2024-10-18 ENCOUNTER — Ambulatory Visit
